# Patient Record
Sex: Female | Born: 1935
Health system: Southern US, Community
[De-identification: ages and names within clinical notes are randomized; demographics above are authoritative.]

## PROBLEM LIST (undated history)

## (undated) DIAGNOSIS — M549 Dorsalgia, unspecified: Secondary | ICD-10-CM

## (undated) DIAGNOSIS — I509 Heart failure, unspecified: Secondary | ICD-10-CM

## (undated) DIAGNOSIS — I340 Nonrheumatic mitral (valve) insufficiency: Secondary | ICD-10-CM

## (undated) DIAGNOSIS — E785 Hyperlipidemia, unspecified: Secondary | ICD-10-CM

## (undated) DIAGNOSIS — R609 Edema, unspecified: Secondary | ICD-10-CM

## (undated) DIAGNOSIS — I454 Nonspecific intraventricular block: Secondary | ICD-10-CM

## (undated) DIAGNOSIS — R600 Localized edema: Secondary | ICD-10-CM

## (undated) DIAGNOSIS — I482 Chronic atrial fibrillation, unspecified: Secondary | ICD-10-CM

## (undated) DIAGNOSIS — G8929 Other chronic pain: Secondary | ICD-10-CM

## (undated) DIAGNOSIS — I4819 Other persistent atrial fibrillation: Secondary | ICD-10-CM

## (undated) HISTORY — DX: Hyperlipidemia, unspecified: E78.5

## (undated) HISTORY — PX: MULTIPLE TOOTH EXTRACTIONS: SHX2053

## (undated) HISTORY — DX: Dorsalgia, unspecified: M54.9

## (undated) HISTORY — DX: Localized edema: R60.0

## (undated) HISTORY — DX: Nonspecific intraventricular block: I45.4

## (undated) HISTORY — DX: Chronic atrial fibrillation, unspecified: I48.20

## (undated) HISTORY — DX: Other chronic pain: G89.29

## (undated) HISTORY — DX: Nonrheumatic mitral (valve) insufficiency: I34.0

## (undated) HISTORY — PX: ABDOMINAL HYSTERECTOMY: SHX81

## (undated) HISTORY — DX: Edema, unspecified: R60.9

---

## 2004-05-15 ENCOUNTER — Ambulatory Visit: Payer: Self-pay | Admitting: Cardiology

## 2004-06-16 ENCOUNTER — Ambulatory Visit: Payer: Self-pay | Admitting: Cardiology

## 2004-07-14 ENCOUNTER — Ambulatory Visit: Payer: Self-pay | Admitting: Cardiology

## 2004-08-11 ENCOUNTER — Ambulatory Visit: Payer: Self-pay | Admitting: Cardiology

## 2004-09-09 ENCOUNTER — Ambulatory Visit: Payer: Self-pay | Admitting: Cardiology

## 2004-10-07 ENCOUNTER — Ambulatory Visit: Payer: Self-pay | Admitting: Cardiology

## 2004-10-16 ENCOUNTER — Ambulatory Visit: Payer: Self-pay | Admitting: Cardiology

## 2004-11-06 ENCOUNTER — Ambulatory Visit: Payer: Self-pay | Admitting: Cardiology

## 2004-11-13 ENCOUNTER — Ambulatory Visit: Payer: Self-pay | Admitting: Cardiology

## 2004-11-20 ENCOUNTER — Ambulatory Visit: Payer: Self-pay | Admitting: Cardiology

## 2004-11-28 ENCOUNTER — Ambulatory Visit: Payer: Self-pay | Admitting: Cardiology

## 2004-12-12 ENCOUNTER — Ambulatory Visit: Payer: Self-pay | Admitting: Cardiology

## 2005-01-12 ENCOUNTER — Ambulatory Visit: Payer: Self-pay | Admitting: Cardiology

## 2005-02-16 ENCOUNTER — Ambulatory Visit: Payer: Self-pay | Admitting: Cardiology

## 2005-02-23 ENCOUNTER — Ambulatory Visit: Payer: Self-pay | Admitting: Cardiology

## 2005-03-03 ENCOUNTER — Ambulatory Visit: Payer: Self-pay | Admitting: Cardiology

## 2005-04-01 ENCOUNTER — Ambulatory Visit: Payer: Self-pay | Admitting: Cardiology

## 2005-04-08 ENCOUNTER — Ambulatory Visit: Payer: Self-pay | Admitting: Cardiology

## 2005-05-06 ENCOUNTER — Ambulatory Visit: Payer: Self-pay | Admitting: Cardiology

## 2005-05-13 ENCOUNTER — Ambulatory Visit: Payer: Self-pay | Admitting: Cardiology

## 2005-06-10 ENCOUNTER — Ambulatory Visit: Payer: Self-pay | Admitting: Cardiology

## 2005-06-17 ENCOUNTER — Ambulatory Visit: Payer: Self-pay | Admitting: Cardiology

## 2005-07-15 ENCOUNTER — Ambulatory Visit: Payer: Self-pay | Admitting: Cardiology

## 2005-07-22 ENCOUNTER — Ambulatory Visit: Payer: Self-pay | Admitting: Cardiology

## 2005-08-19 ENCOUNTER — Ambulatory Visit: Payer: Self-pay | Admitting: Cardiology

## 2005-09-16 ENCOUNTER — Ambulatory Visit: Payer: Self-pay | Admitting: Cardiology

## 2005-10-14 ENCOUNTER — Ambulatory Visit: Payer: Self-pay | Admitting: Cardiology

## 2005-11-10 ENCOUNTER — Ambulatory Visit: Payer: Self-pay | Admitting: Cardiology

## 2005-12-01 ENCOUNTER — Ambulatory Visit: Payer: Self-pay | Admitting: Cardiology

## 2005-12-02 ENCOUNTER — Ambulatory Visit: Payer: Self-pay | Admitting: Cardiology

## 2005-12-03 ENCOUNTER — Ambulatory Visit: Payer: Self-pay | Admitting: Cardiology

## 2005-12-08 ENCOUNTER — Ambulatory Visit: Payer: Self-pay | Admitting: Cardiology

## 2005-12-16 ENCOUNTER — Ambulatory Visit: Payer: Self-pay | Admitting: Cardiology

## 2006-01-04 ENCOUNTER — Ambulatory Visit: Payer: Self-pay | Admitting: Cardiology

## 2006-01-11 ENCOUNTER — Ambulatory Visit: Payer: Self-pay | Admitting: Cardiology

## 2006-01-25 ENCOUNTER — Ambulatory Visit: Payer: Self-pay | Admitting: Cardiology

## 2006-02-23 ENCOUNTER — Ambulatory Visit: Payer: Self-pay | Admitting: Cardiology

## 2006-03-26 ENCOUNTER — Ambulatory Visit: Payer: Self-pay | Admitting: Cardiology

## 2006-04-09 ENCOUNTER — Ambulatory Visit: Payer: Self-pay | Admitting: Cardiology

## 2006-04-16 ENCOUNTER — Ambulatory Visit: Payer: Self-pay | Admitting: Cardiology

## 2006-05-14 ENCOUNTER — Ambulatory Visit: Payer: Self-pay | Admitting: Cardiology

## 2006-06-11 ENCOUNTER — Ambulatory Visit: Payer: Self-pay | Admitting: Cardiology

## 2006-07-09 ENCOUNTER — Ambulatory Visit: Payer: Self-pay | Admitting: Cardiology

## 2006-08-05 ENCOUNTER — Ambulatory Visit: Payer: Self-pay | Admitting: Cardiology

## 2006-09-02 ENCOUNTER — Ambulatory Visit: Payer: Self-pay | Admitting: Cardiology

## 2006-09-30 ENCOUNTER — Ambulatory Visit: Payer: Self-pay | Admitting: Cardiology

## 2006-10-28 ENCOUNTER — Ambulatory Visit: Payer: Self-pay | Admitting: Cardiology

## 2006-11-15 ENCOUNTER — Ambulatory Visit: Payer: Self-pay | Admitting: Cardiology

## 2006-12-10 ENCOUNTER — Ambulatory Visit: Payer: Self-pay | Admitting: Physician Assistant

## 2006-12-23 ENCOUNTER — Ambulatory Visit: Payer: Self-pay | Admitting: Physician Assistant

## 2007-01-04 ENCOUNTER — Ambulatory Visit: Payer: Self-pay | Admitting: Cardiology

## 2007-02-08 ENCOUNTER — Ambulatory Visit: Payer: Self-pay | Admitting: Cardiology

## 2007-03-08 ENCOUNTER — Ambulatory Visit: Payer: Self-pay | Admitting: Cardiology

## 2007-04-05 ENCOUNTER — Ambulatory Visit: Payer: Self-pay | Admitting: Cardiology

## 2007-05-03 ENCOUNTER — Ambulatory Visit: Payer: Self-pay | Admitting: Cardiology

## 2007-05-31 ENCOUNTER — Ambulatory Visit: Payer: Self-pay | Admitting: Cardiology

## 2007-06-29 ENCOUNTER — Ambulatory Visit: Payer: Self-pay | Admitting: Cardiology

## 2007-07-07 ENCOUNTER — Ambulatory Visit: Payer: Self-pay | Admitting: Cardiology

## 2007-07-27 ENCOUNTER — Ambulatory Visit: Payer: Self-pay | Admitting: Cardiology

## 2007-08-24 ENCOUNTER — Ambulatory Visit: Payer: Self-pay | Admitting: Cardiology

## 2007-09-21 ENCOUNTER — Ambulatory Visit: Payer: Self-pay | Admitting: Cardiology

## 2007-10-19 ENCOUNTER — Ambulatory Visit: Payer: Self-pay | Admitting: Cardiology

## 2007-11-15 ENCOUNTER — Ambulatory Visit: Payer: Self-pay | Admitting: Cardiology

## 2007-11-17 ENCOUNTER — Encounter: Payer: Self-pay | Admitting: Cardiology

## 2007-12-14 ENCOUNTER — Ambulatory Visit: Payer: Self-pay | Admitting: Cardiology

## 2008-01-03 ENCOUNTER — Ambulatory Visit: Payer: Self-pay | Admitting: Cardiology

## 2008-01-17 ENCOUNTER — Ambulatory Visit: Payer: Self-pay | Admitting: Cardiology

## 2008-02-20 ENCOUNTER — Ambulatory Visit: Payer: Self-pay | Admitting: Cardiology

## 2008-03-20 ENCOUNTER — Ambulatory Visit: Payer: Self-pay | Admitting: Cardiology

## 2008-04-17 ENCOUNTER — Ambulatory Visit: Payer: Self-pay | Admitting: Cardiology

## 2008-05-15 ENCOUNTER — Ambulatory Visit: Payer: Self-pay | Admitting: Cardiology

## 2008-06-12 ENCOUNTER — Ambulatory Visit: Payer: Self-pay | Admitting: Cardiology

## 2008-07-10 ENCOUNTER — Ambulatory Visit: Payer: Self-pay | Admitting: Cardiology

## 2008-08-07 ENCOUNTER — Ambulatory Visit: Payer: Self-pay | Admitting: Cardiology

## 2008-09-04 ENCOUNTER — Ambulatory Visit: Payer: Self-pay | Admitting: Cardiology

## 2008-10-02 ENCOUNTER — Ambulatory Visit: Payer: Self-pay | Admitting: Cardiology

## 2008-10-30 ENCOUNTER — Ambulatory Visit: Payer: Self-pay

## 2008-11-20 ENCOUNTER — Ambulatory Visit: Payer: Self-pay | Admitting: Cardiology

## 2008-12-04 ENCOUNTER — Ambulatory Visit: Payer: Self-pay | Admitting: Cardiology

## 2008-12-21 ENCOUNTER — Ambulatory Visit: Payer: Self-pay

## 2009-01-15 ENCOUNTER — Ambulatory Visit: Payer: Self-pay | Admitting: Cardiology

## 2009-01-16 ENCOUNTER — Encounter (INDEPENDENT_AMBULATORY_CARE_PROVIDER_SITE_OTHER): Payer: Self-pay | Admitting: *Deleted

## 2009-01-16 ENCOUNTER — Ambulatory Visit: Payer: Self-pay | Admitting: Cardiology

## 2009-02-11 ENCOUNTER — Encounter: Payer: Self-pay | Admitting: *Deleted

## 2009-02-12 ENCOUNTER — Ambulatory Visit: Payer: Self-pay | Admitting: Cardiology

## 2009-02-12 LAB — CONVERTED CEMR LAB: Prothrombin Time: 16.2 s

## 2009-03-05 ENCOUNTER — Ambulatory Visit: Payer: Self-pay | Admitting: Cardiology

## 2009-04-02 ENCOUNTER — Ambulatory Visit: Payer: Self-pay | Admitting: Cardiology

## 2009-04-02 LAB — CONVERTED CEMR LAB: POC INR: 2.1

## 2009-04-30 ENCOUNTER — Ambulatory Visit: Payer: Self-pay | Admitting: Cardiology

## 2009-04-30 LAB — CONVERTED CEMR LAB: POC INR: 2.8

## 2009-05-28 ENCOUNTER — Ambulatory Visit: Payer: Self-pay | Admitting: Cardiology

## 2009-05-28 LAB — CONVERTED CEMR LAB: POC INR: 2.4

## 2009-06-25 ENCOUNTER — Ambulatory Visit: Payer: Self-pay | Admitting: Cardiology

## 2009-06-25 LAB — CONVERTED CEMR LAB: POC INR: 2.4

## 2009-07-23 ENCOUNTER — Ambulatory Visit: Payer: Self-pay | Admitting: Cardiology

## 2009-07-23 LAB — CONVERTED CEMR LAB: POC INR: 2.4

## 2009-08-20 ENCOUNTER — Ambulatory Visit: Payer: Self-pay | Admitting: Cardiology

## 2009-08-20 LAB — CONVERTED CEMR LAB: POC INR: 2.1

## 2009-09-17 ENCOUNTER — Ambulatory Visit: Payer: Self-pay | Admitting: Cardiology

## 2009-10-08 ENCOUNTER — Ambulatory Visit: Payer: Self-pay | Admitting: Cardiology

## 2009-11-05 ENCOUNTER — Ambulatory Visit: Payer: Self-pay | Admitting: Cardiology

## 2009-12-03 ENCOUNTER — Ambulatory Visit: Payer: Self-pay | Admitting: Cardiology

## 2009-12-31 ENCOUNTER — Ambulatory Visit: Payer: Self-pay | Admitting: Cardiology

## 2010-01-28 ENCOUNTER — Ambulatory Visit: Payer: Self-pay | Admitting: Cardiology

## 2010-01-28 LAB — CONVERTED CEMR LAB: POC INR: 2.8

## 2010-02-03 ENCOUNTER — Encounter: Payer: Self-pay | Admitting: Cardiology

## 2010-02-05 ENCOUNTER — Ambulatory Visit: Payer: Self-pay | Admitting: Cardiology

## 2010-02-25 ENCOUNTER — Ambulatory Visit: Payer: Self-pay | Admitting: Cardiology

## 2010-02-25 LAB — CONVERTED CEMR LAB: POC INR: 2.7

## 2010-03-25 ENCOUNTER — Ambulatory Visit: Payer: Self-pay | Admitting: Cardiology

## 2010-03-25 LAB — CONVERTED CEMR LAB: POC INR: 3.1

## 2010-04-22 ENCOUNTER — Ambulatory Visit: Payer: Self-pay | Admitting: Cardiology

## 2010-04-22 LAB — CONVERTED CEMR LAB: POC INR: 2.7

## 2010-05-20 ENCOUNTER — Ambulatory Visit: Payer: Self-pay | Admitting: Cardiology

## 2010-05-20 LAB — CONVERTED CEMR LAB: POC INR: 2.5

## 2010-06-17 ENCOUNTER — Ambulatory Visit: Payer: Self-pay | Admitting: Cardiology

## 2010-07-02 ENCOUNTER — Encounter: Payer: Self-pay | Admitting: Cardiology

## 2010-07-18 ENCOUNTER — Ambulatory Visit: Admission: RE | Admit: 2010-07-18 | Discharge: 2010-07-18 | Payer: Self-pay | Source: Home / Self Care

## 2010-07-29 NOTE — Medication Information (Signed)
Summary: ccr-lr  Anticoagulant Therapy  Managed by: Vashti Hey, RN PCP: Donzetta Sprung, MD Supervising MD: Diona Browner MD, Remi Deter Indication 1: Atrial Fibrillation (ICD-427.31) Lab [facility] [place] Site: [facility] Clinic INR POC 2.7  Dietary changes: no    Health status changes: no    Bleeding/hemorrhagic complications: no    Recent/future hospitalizations: no    Any changes in medication regimen? yes       Details: On tramadol for pain  Recent/future dental: no  Any missed doses?: no       Is patient compliant with meds? yes       Allergies: 1)  ! Pcn  Anticoagulation Management History:      The patient is taking warfarin and comes in today for a routine follow up visit.  Positive risk factors for bleeding include an age of 3 years or older.  The bleeding index is 'intermediate risk'.  Positive CHADS2 values include Age > 44 years old.  The start date was 07/20/2001.  Anticoagulation responsible provider: Diona Browner MD, Remi Deter.  INR POC: 2.7.  Cuvette Lot#: [id number].  Exp: 10/11.    Anticoagulation Management Assessment/Plan:      The patient's current anticoagulation dose is Coumadin 4 mg tabs: TAD.  The target INR is 2 - 3.  The next INR is due 05/20/2010.  Anticoagulation instructions were given to patient.  Results were reviewed/authorized by Vashti Hey, RN.  She was notified by Vashti Hey RN.         Prior Anticoagulation Instructions: INR 3.1 Take coumadin 2mg  tonight then resume 4mg  once daily   Current Anticoagulation Instructions: INR 2.7 Continue coumadin 4mg  once daily

## 2010-07-29 NOTE — Medication Information (Signed)
Summary: ccr-lr  Anticoagulant Therapy  Managed by: Vashti Hey, RN PCP: Billie Lade MD: Andee Lineman MD, Michelle Piper Indication 1: Atrial Fibrillation (ICD-427.31) Lab Used: [facility] [place] Site: [facility] Clinic INR POC 1.8  Dietary changes: no    Health status changes: yes       Details: shingles on back  Bleeding/hemorrhagic complications: no    Recent/future hospitalizations: no    Any changes in medication regimen? yes       Details: on valtrex 1gm 1 tab tid  started on 3/18 - 3/25  Recent/future dental: no  Any missed doses?: no       Is patient compliant with meds? yes       Anticoagulation Management History:      The patient is taking warfarin and comes in today for a routine follow up visit.  Positive risk factors for bleeding include an age of 16 years or older.  The bleeding index is 'intermediate risk'.  Positive CHADS2 values include Age > 34 years old.  The start date was 07/20/2001.  Anticoagulation responsible provider: Andee Lineman MD, Michelle Piper.  INR POC: 1.8.  Cuvette Lot#: [id number].  Exp: 10/11.    Anticoagulation Management Assessment/Plan:      The patient's current anticoagulation dose is Coumadin 4 mg tabs: TAD.  The target INR is 2 - 3.  The next INR is due 10/08/2009.  Anticoagulation instructions were given to patient.  Results were reviewed/authorized by Vashti Hey, RN.  She was notified by Vashti Hey RN.         Prior Anticoagulation Instructions: INR  2.1 Continue coumadin 4mg  once daily except 2mg  on fridays  Current Anticoagulation Instructions: INR 1.8 Take coumadin 6mg  tonight then increase dose to 4mg  once daily

## 2010-07-29 NOTE — Medication Information (Signed)
Summary: CCR  Anticoagulant Therapy  Managed by: Vashti Hey, RN PCP: Billie Lade MD: Diona Browner MD, Remi Deter Indication 1: Atrial Fibrillation (ICD-427.31) Lab Used: [facility] [place] Site: [facility] Care Clinic INR POC 2.5  Dietary changes: no    Health status changes: no    Bleeding/hemorrhagic complications: no    Recent/future hospitalizations: no    Any changes in medication regimen? no    Recent/future dental: no  Any missed doses?: no       Is patient compliant with meds? yes       Anticoagulation Management History:      The patient is taking warfarin and comes in today for a routine follow up visit.  Positive risk factors for bleeding include an age of 80 years or older.  The bleeding index is 'intermediate risk'.  Positive CHADS2 values include Age > 66 years old.  The start date was 07/20/2001.  Anticoagulation responsible provider: Diona Browner MD, Remi Deter.  INR POC: 2.5.  Cuvette Lot#: [id number].  Exp: 10/11.    Anticoagulation Management Assessment/Plan:      The patient's current anticoagulation dose is Coumadin 4 mg tabs: TAD.  The target INR is 2 - 3.  The next INR is due 12/03/2009.  Anticoagulation instructions were given to patient.  Results were reviewed/authorized by Vashti Hey, RN.  She was notified by Vashti Hey RN.         Prior Anticoagulation Instructions: INR 2.2 Continue coumadin 4mg  once daily   Current Anticoagulation Instructions: INR 2.5 Continue coumadin 4mg  once daily

## 2010-07-29 NOTE — Assessment & Plan Note (Signed)
Summary: F1Y   Visit Type:  Follow-up Primary Provider:  Donzetta Sprung, MD  CC:  atrial fibrillation.  History of Present Illness: The patient is seen for followup of atrial fibrillation.  She is stable.  She does not have any chest pain or shortness of breath.  She does not have significant palpitations very I saw her last in July of 2010. she is on Coumadin.  She says that at times she may have some mild "staggering gait."  She has not had syncope or presyncope.  She knows to be careful if she feels this way.  Preventive Screening-Counseling & Management  Alcohol-Tobacco     Smoking Status: quit     Year Quit: 1970's  Current Medications (verified): 1)  Coumadin 4 Mg Tabs (Warfarin Sodium) .... Tad 2)  Multiple Vitamin  Tabs (Multiple Vitamin) .... Once Daily 3)  Fish Oil 1200 Mg Caps (Omega-3 Fatty Acids) .... Take 1 Tablet By Mouth Two Times A Day 4)  Furosemide 40 Mg Tabs (Furosemide) .... Take 1 Tablet By Mouth Two Times A Day 5)  Hydrochlorothiazide 25 Mg Tabs (Hydrochlorothiazide) .... Take 1 Tablet By Mouth Once A Day 6)  Lovastatin 20 Mg Tabs (Lovastatin) .... Take 1 Tablet By Mouth Once A Day 7)  Vitamin D3 1000 Unit Tabs (Cholecalciferol) .... Take 1 Tablet By Mouth Once A Day 8)  Diltiazem Hcl Er Beads 240 Mg Xr24h-Cap (Diltiazem Hcl Er Beads) .... Take 1 Tablet By Mouth Once A Day 9)  Lyrica 50 Mg Caps (Pregabalin) .... Take 1 Tablet By Mouth Once A Day  Allergies (verified): 1)  ! Pcn  Comments:  Nurse/Medical Assistant: The patient's medication bottles and allergies were reviewed with the patient and were updated in the Medication and Allergy Lists.  Past History:  Past Medical History: HYPERLIPIDEMIA-MIXED (ICD-272.4) Chronic lower extremity edema MITRAL REGURGITATION, MILD (ICD-396.3) ATRIAL FIBRILLATION (ICD-427.31) LV  normal function...echo  June, 2007 MR   mild ...echo.Marland KitchenMarland Kitchen6/2007 IVCD Coumadin RX Nuclear 11/2005...no ischemia...possible attenuation  artifact Chest CT scans abnormal...2005/2006/2007...stable liver mass...also hypodense liver lesions.  Review of Systems       Patient denies fever, chills, headache, sweats, rash, change in vision, change in hearing, chest pain, cough, nausea vomiting, urinary symptoms.  All other systems are reviewed and are negative.  Vital Signs:  Patient profile:   77 year old female Height:      66 inches Weight:      202 pounds BMI:     32.72 Pulse rate:   57 / minute BP sitting:   110 / 64  (left arm) Cuff size:   regular  Vitals Entered By: Carlye Grippe (February 05, 2010 1:32 PM)  Nutrition Counseling: Patient's BMI is greater than 25 and therefore counseled on weight management options.  Physical Exam  General:  patient is quite stable. Head:  head is atraumatic. Neck:  no jugular venous distention. Lungs:  lungs are clear.  Respiratory effort is unlabored. Heart:  cardiac exam reveals S1-S2.  No clicks or significant murmurs. Abdomen:  abdomen is soft. Extremities:  no peripheral edema. Psych:  patient is oriented to person time and place.  Affect is normal.   Impression & Recommendations:  Problem # 1:  COUMADIN THERAPY (ICD-V58.61) Coumadin is to be continued.  Problem # 2:  EDEMA (ICD-782.3) The patient has mild chronic edema.  Problem # 3:  ATRIAL FIBRILLATION (ICD-427.31)  Her updated medication list for this problem includes:    Coumadin 4 Mg Tabs (Warfarin sodium) .Marland KitchenMarland KitchenMarland KitchenMarland Kitchen  Tad  Orders: EKG w/ Interpretation (93000) Atrial fib is stable.  EKG done today reviewed by me.  She has atrial fib controlled rate.  There is interventricular conduction delay.  No change in therapy.  Appended Document: [facility]     Allergies: 1)  ! Pcn   Patient Instructions: 1)  Your physician wants you to follow-up in: 1 year. You will receive a reminder letter in the mail one-two months in advance. If you don't receive a letter, please call our office to schedule the  follow-up appointment.  2)  Your physician recommends that you continue on your current medications as directed. Please refer to the Current Medication list given to you today.

## 2010-07-29 NOTE — Miscellaneous (Signed)
  Clinical Lists Changes  Problems: Added new problem of COUMADIN THERAPY (ICD-V58.61) Added new problem of * LIVER MASS AND HYPODENSE LESIONS Observations: Added new observation of PAST MED HX: HYPERLIPIDEMIA-MIXED (ICD-272.4) Chronic lower extremity edema MITRAL REGURGITATION, MILD (ICD-396.3) ATRIAL FIBRILLATION (ICD-427.31) LV  normal function...echo  June, 2007 MR   mild ...echo.Marland KitchenMarland Kitchen6/2007 IVCD Coumadin RX Nuclear 11/2005...no ischemia...possible attenuation artifact Chest CT scans abnormal...2005/2006/2007...stable liver mass...also hypodense liver lesions.  (02/03/2010 19:15) Added new observation of PRIMARY MD: Donzetta Sprung, MD (02/03/2010 19:15)       Past History:  Past Medical History: HYPERLIPIDEMIA-MIXED (ICD-272.4) Chronic lower extremity edema MITRAL REGURGITATION, MILD (ICD-396.3) ATRIAL FIBRILLATION (ICD-427.31) LV  normal function...echo  June, 2007 MR   mild ...echo.Marland KitchenMarland Kitchen6/2007 IVCD Coumadin RX Nuclear 11/2005...no ischemia...possible attenuation artifact Chest CT scans abnormal...2005/2006/2007...stable liver mass...also hypodense liver lesions.

## 2010-07-29 NOTE — Medication Information (Signed)
Summary: ccr  Anticoagulant Therapy  Managed by: Vashti Hey, RN PCP: Billie Lade MD: Andee Lineman MD, Michelle Piper Indication 1: Atrial Fibrillation (ICD-427.31) Lab Used: [facility] [place] Site: [facility] Care Clinic INR POC 2.1  Dietary changes: no    Health status changes: yes       Details: started drinking tart cherry juice for arthritis  Bleeding/hemorrhagic complications: no    Recent/future hospitalizations: no    Any changes in medication regimen? no    Recent/future dental: no  Any missed doses?: no       Is patient compliant with meds? yes       Anticoagulation Management History:      The patient is taking warfarin and comes in today for a routine follow up visit.  Positive risk factors for bleeding include an age of 75 years or older.  The bleeding index is 'intermediate risk'.  Positive CHADS2 values include Age > 43 years old.  The start date was 07/20/2001.  Anticoagulation responsible provider: Andee Lineman MD, Michelle Piper.  INR POC: 2.1.  Cuvette Lot#: [id number].  Exp: 10/11.    Anticoagulation Management Assessment/Plan:      The patient's current anticoagulation dose is Coumadin 4 mg tabs: TAD.  The target INR is 2 - 3.  The next INR is due 09/17/2009.  Anticoagulation instructions were given to patient.  Results were reviewed/authorized by Vashti Hey, RN.  She was notified by Vashti Hey RN.         Prior Anticoagulation Instructions: INR 2.4 Continue coumadin 4mg  once daily except 2mg  on Fridays  Current Anticoagulation Instructions: INR  2.1 Continue coumadin 4mg  once daily except 2mg  on fridays

## 2010-07-29 NOTE — Medication Information (Signed)
Summary: ccr-lr  Anticoagulant Therapy  Managed by: Vashti Hey, RN PCP: Billie Lade MD: Antoine Poche MD, Fayrene Fearing Indication 1: Atrial Fibrillation (ICD-427.31) Lab Used: [facility] [place] Site: [facility] of Care Clinic INR POC 2.5  Dietary changes: no    Health status changes: no    Bleeding/hemorrhagic complications: no    Recent/future hospitalizations: no    Any changes in medication regimen? yes       Details: Lyrica 50mg  1 qd x 3 weeks for shingle pain  Recent/future dental: no  Any missed doses?: no       Is patient compliant with meds? yes       Anticoagulation Management History:      The patient is taking warfarin and comes in today for a routine follow up visit.  Positive risk factors for bleeding include an age of 75 years or older.  The bleeding index is 'intermediate risk'.  Positive CHADS2 values include Age > 84 years old.  The start date was 07/20/2001.  Anticoagulation responsible provider: Antoine Poche MD, Fayrene Fearing.  INR POC: 2.5.  Cuvette Lot#: [id number].  Exp: 10/11.    Anticoagulation Management Assessment/Plan:      The patient's current anticoagulation dose is Coumadin 4 mg tabs: TAD.  The target INR is 2 - 3.  The next INR is due 01/28/2010.  Anticoagulation instructions were given to patient.  Results were reviewed/authorized by Vashti Hey, RN.  She was notified by Vashti Hey RN.         Prior Anticoagulation Instructions: INR 2.8 Continue coumadin 4mg  once daily   Current Anticoagulation Instructions: INR 2.5 Continue coumadin 4mg  once daily

## 2010-07-29 NOTE — Medication Information (Signed)
Summary: ccr-lr  Anticoagulant Therapy  Managed by: Vashti Hey, RN PCP: Donzetta Sprung, MD Supervising MD: Diona Browner MD, Remi Deter Indication 1: Atrial Fibrillation (ICD-427.31) Lab Used: [facility] [place] Site: [facility] Care Clinic INR POC 2.5  Dietary changes: no    Health status changes: no    Bleeding/hemorrhagic complications: no    Recent/future hospitalizations: no    Any changes in medication regimen? no    Recent/future dental: no  Any missed doses?: no       Is patient compliant with meds? yes       Allergies: 1)  ! Pcn  Anticoagulation Management History:      The patient is taking warfarin and comes in today for a routine follow up visit.  Positive risk factors for bleeding include an age of 75 years or older.  The bleeding index is 'intermediate risk'.  Positive CHADS2 values include Age > 75 years old.  The start date was 07/20/2001.  Anticoagulation responsible provider: Diona Browner MD, Remi Deter.  INR POC: 2.5.  Cuvette Lot#: [id number].  Exp: 10/11.    Anticoagulation Management Assessment/Plan:      The patient's current anticoagulation dose is Coumadin 4 mg tabs: TAD.  The target INR is 2 - 3.  The next INR is due 06/17/2010.  Anticoagulation instructions were given to patient.  Results were reviewed/authorized by Vashti Hey, RN.  She was notified by Vashti Hey RN.         Prior Anticoagulation Instructions: INR 2.7 Continue coumadin 4mg  once daily   Current Anticoagulation Instructions: INR 2.5 Continue coumadin 4mg  once daily

## 2010-07-29 NOTE — Medication Information (Signed)
Summary: ccr-lr  Anticoagulant Therapy  Managed by: Vashti Hey, RN PCP: Billie Lade MD: Andee Lineman MD, Michelle Piper Indication 1: Atrial Fibrillation (ICD-427.31) Lab Used: [facility] [place] Site: [facility] Care Clinic INR POC 2.4  Dietary changes: no    Health status changes: no    Bleeding/hemorrhagic complications: no    Recent/future hospitalizations: no    Any changes in medication regimen? no    Recent/future dental: no  Any missed doses?: no       Is patient compliant with meds? yes       Anticoagulation Management History:      The patient is taking warfarin and comes in today for a routine follow up visit.  Positive risk factors for bleeding include an age of 10 years or older.  The bleeding index is 'intermediate risk'.  Positive CHADS2 values include Age > 29 years old.  The start date was 07/20/2001.  Anticoagulation responsible provider: Andee Lineman MD, Michelle Piper.  INR POC: 2.4.  Cuvette Lot#: [id number].  Exp: 10/11.    Anticoagulation Management Assessment/Plan:      The patient's current anticoagulation dose is Coumadin 4 mg tabs: TAD.  The target INR is 2 - 3.  The next INR is due 08/20/2009.  Anticoagulation instructions were given to patient.  Results were reviewed/authorized by Vashti Hey, RN.  She was notified by Vashti Hey RN.         Prior Anticoagulation Instructions: INR 2.4 Continue coumadin 4mg  once daily except 2mg  on Fridays  Current Anticoagulation Instructions: Same as Prior Instructions.

## 2010-07-29 NOTE — Medication Information (Signed)
Summary: ccr-lr  Anticoagulant Therapy  Managed by: Vashti Hey, RN PCP: Donzetta Sprung, MD Supervising MD: Andee Lineman MD, Michelle Piper Indication 1: Atrial Fibrillation (ICD-427.31) Lab Used: [facility] [place] Site: [facility] Clinic INR POC 3.1  Dietary changes: no    Health status changes: no    Bleeding/hemorrhagic complications: no    Recent/future hospitalizations: no    Any changes in medication regimen? no    Recent/future dental: no  Any missed doses?: no       Is patient compliant with meds? yes       Allergies: 1)  ! Pcn  Anticoagulation Management History:      The patient is taking warfarin and comes in today for a routine follow up visit.  Positive risk factors for bleeding include an age of 75 years or older.  The bleeding index is 'intermediate risk'.  Positive CHADS2 values include Age > 75 years old.  The start date was 07/20/2001.  Anticoagulation responsible provider: Andee Lineman MD, Michelle Piper.  INR POC: 3.1.  Cuvette Lot#: [id number].  Exp: 10/11.    Anticoagulation Management Assessment/Plan:      The patient's current anticoagulation dose is Coumadin 4 mg tabs: TAD.  The target INR is 2 - 3.  The next INR is due 04/22/2010.  Anticoagulation instructions were given to patient.  Results were reviewed/authorized by Vashti Hey, RN.  She was notified by Vashti Hey RN.         Prior Anticoagulation Instructions: INR 2.7 Continue coumadin 4mg  once daily   Current Anticoagulation Instructions: INR 3.1 Take coumadin 2mg  tonight then resume 4mg  once daily

## 2010-07-29 NOTE — Medication Information (Signed)
Summary: ccr-lr  Anticoagulant Therapy  Managed by: Vashti Hey, RN PCP: Billie Lade MD: Diona Browner MD, Remi Deter Indication 1: Atrial Fibrillation (ICD-427.31) Lab Used: [facility] [place] Site: [facility] of Care Clinic INR POC 2.2  Dietary changes: no    Health status changes: no    Bleeding/hemorrhagic complications: no    Recent/future hospitalizations: no    Any changes in medication regimen? no    Recent/future dental: no  Any missed doses?: no       Is patient compliant with meds? yes       Anticoagulation Management History:      The patient is taking warfarin and comes in today for a routine follow up visit.  Positive risk factors for bleeding include an age of 75 years or older.  The bleeding index is 'intermediate risk'.  Positive CHADS2 values include Age > 2 years old.  The start date was 07/20/2001.  Anticoagulation responsible provider: Diona Browner MD, Remi Deter.  INR POC: 2.2.  Cuvette Lot#: [id number].  Exp: 10/11.    Anticoagulation Management Assessment/Plan:      The patient's current anticoagulation dose is Coumadin 4 mg tabs: TAD.  The target INR is 2 - 3.  The next INR is due 11/05/2009.  Anticoagulation instructions were given to patient.  Results were reviewed/authorized by Vashti Hey, RN.  She was notified by Vashti Hey RN.         Prior Anticoagulation Instructions: INR 1.8 Take coumadin 6mg  tonight then increase dose to 4mg  once daily   Current Anticoagulation Instructions: INR 2.2 Continue coumadin 4mg  once daily

## 2010-07-29 NOTE — Medication Information (Signed)
Summary: ccr-lr  Anticoagulant Therapy  Managed by: Vashti Hey, RN PCP: Billie Lade MD: Diona Browner MD, Remi Deter Indication 1: Atrial Fibrillation (ICD-427.31) Lab [facility]: [facility] [place] Site: [facility] Care Clinic INR POC 2.8  Dietary changes: no    Health status changes: no    Bleeding/hemorrhagic complications: no    Recent/future hospitalizations: no    Any changes in medication regimen? no    Recent/future dental: no  Any missed doses?: yes     Details: missed 1 dose last week  Is patient compliant with meds? yes       Anticoagulation Management History:      The patient is taking warfarin and comes in today for a routine follow up visit.  Positive risk factors for bleeding include an age of 75 years or older.  The bleeding index is 'intermediate risk'.  Positive CHADS2 values include Age > 66 years old.  The start date was 07/20/2001.  Anticoagulation responsible provider: Diona Browner MD, Remi Deter.  INR POC: 2.8.  Cuvette Lot#: [id number].  Exp: 10/11.    Anticoagulation Management Assessment/Plan:      The patient's current anticoagulation dose is Coumadin 4 mg tabs: TAD.  The target INR is 2 - 3.  The next INR is due 12/31/2009.  Anticoagulation instructions were given to patient.  Results were reviewed/authorized by Vashti Hey, RN.  She was notified by Vashti Hey RN.         Prior Anticoagulation Instructions: INR 2.5 Continue coumadin 4mg  once daily   Current Anticoagulation Instructions: INR 2.8 Continue coumadin 4mg  once daily

## 2010-07-29 NOTE — Medication Information (Signed)
Summary: ccr-lr  Anticoagulant Therapy  Managed by: Vashti Hey, RN PCP: Billie Lade MD: Diona Browner MD, Remi Deter Indication 1: Atrial Fibrillation (ICD-427.31) Lab Used: [facility] [place] Site: [facility] of Care Clinic INR POC 2.8  Dietary changes: no    Health status changes: no    Bleeding/hemorrhagic complications: no    Recent/future hospitalizations: no    Any changes in medication regimen? no    Recent/future dental: no  Any missed doses?: no       Is patient compliant with meds? yes       Anticoagulation Management History:      The patient is taking warfarin and comes in today for a routine follow up visit.  Positive risk factors for bleeding include an age of 75 years or older.  The bleeding index is 'intermediate risk'.  Positive CHADS2 values include Age > 34 years old.  The start date was 07/20/2001.  Anticoagulation responsible provider: Diona Browner MD, Remi Deter.  INR POC: 2.8.  Cuvette Lot#: [id number].  Exp: 10/11.    Anticoagulation Management Assessment/Plan:      The patient's current anticoagulation dose is Coumadin 4 mg tabs: TAD.  The target INR is 2 - 3.  The next INR is due 02/25/2010.  Anticoagulation instructions were given to patient.  Results were reviewed/authorized by Vashti Hey, RN.  She was notified by Vashti Hey RN.         Prior Anticoagulation Instructions: INR 2.5 Continue coumadin 4mg  once daily   Current Anticoagulation Instructions: INR 2.8 Continue coumadin 4mg  once daily

## 2010-07-29 NOTE — Medication Information (Signed)
Summary: ccr-lr  Anticoagulant Therapy  Managed by: Vashti Hey, RN PCP: Donzetta Sprung, MD Supervising MD: Andee Lineman MD, Michelle Piper Indication 1: Atrial Fibrillation (ICD-427.31) Lab [facility] [place] Site: [facility] Clinic INR POC 2.7  Dietary changes: no    Health status changes: no    Bleeding/hemorrhagic complications: no    Recent/future hospitalizations: no    Any changes in medication regimen? no    Recent/future dental: no  Any missed doses?: no       Is patient compliant with meds? yes       Allergies: 1)  ! Pcn  Anticoagulation Management History:      The patient is taking warfarin and comes in today for a routine follow up visit.  Positive risk factors for bleeding include an age of 17 years or older.  The bleeding index is 'intermediate risk'.  Positive CHADS2 values include Age > 26 years old.  The start date was 07/20/2001.  Anticoagulation responsible provider: Andee Lineman MD, Michelle Piper.  INR POC: 2.7.  Cuvette Lot#: [id number].  Exp: 10/11.    Anticoagulation Management Assessment/Plan:      The patient's current anticoagulation dose is Coumadin 4 mg tabs: TAD.  The target INR is 2 - 3.  The next INR is due 03/25/2010.  Anticoagulation instructions were given to patient.  Results were reviewed/authorized by Vashti Hey, RN.  She was notified by Vashti Hey RN.         Prior Anticoagulation Instructions: INR 2.8 Continue coumadin 4mg  once daily   Current Anticoagulation Instructions: INR 2.7 Continue coumadin 4mg  once daily

## 2010-07-31 NOTE — Medication Information (Signed)
Summary: ccr-lr  Anticoagulant Therapy  Managed by: Vashti Hey, RN PCP: Donzetta Sprung, MD Supervising MD: Diona Browner MD, Remi Deter Indication 1: Atrial Fibrillation (ICD-427.31) Lab Used: [facility] [place] Site: [facility] INR POC 3.2  Dietary changes: no    Health status changes: no    Bleeding/hemorrhagic complications: no    Recent/future hospitalizations: no    Any changes in medication regimen? no    Recent/future dental: no  Any missed doses?: yes     Details: missed 1 dose  Is patient compliant with meds? yes       Allergies: 1)  ! Pcn  Anticoagulation Management History:      The patient is taking warfarin and comes in today for a routine follow up visit.  Positive risk factors for bleeding include an age of 75 years or older.  The bleeding index is 'intermediate risk'.  Positive CHADS2 values include Age > 34 years old.  The start date was 07/20/2001.  Anticoagulation responsible Aidynn Polendo: Diona Browner MD, Remi Deter.  INR POC: 3.2.  Cuvette Lot#: [id number].  Exp: 10/11.    Anticoagulation Management Assessment/Plan:      The patient's current anticoagulation dose is Coumadin 4 mg tabs: TAD.  The target INR is 2 - 3.  The next INR is due 07/15/2010.  Anticoagulation instructions were given to patient.  Results were reviewed/authorized by Vashti Hey, RN.  She was notified by Vashti Hey RN.         Prior Anticoagulation Instructions: INR 2.5 Continue coumadin 4mg  once daily   Current Anticoagulation Instructions: INR 3.2 Take coumadin 1/2 tablet tonight then resume 1 tablet once daily

## 2010-07-31 NOTE — Letter (Signed)
Summary: External Correspondence/ [facility] OFFICE VISIT  External Correspondence/ [facility] OFFICE VISIT   Imported By: Dorise Hiss 07/09/2010 14:02:20  _____________________________________________________________________  External Attachment:    Type:   Image     Comment:   External Document

## 2010-07-31 NOTE — Medication Information (Signed)
Summary: ccr-lr  Anticoagulant Therapy  Managed by: Vashti Hey, RN PCP: Donzetta Sprung, MD Supervising MD: Andee Lineman MD, Michelle Piper Indication 1: Atrial Fibrillation (ICD-427.31) Lab Used: [facility] [place] Site: [facility] Care Clinic INR POC 3.7  Dietary changes: no    Health status changes: no    Bleeding/hemorrhagic complications: no    Recent/future hospitalizations: no    Any changes in medication regimen? no    Recent/future dental: no  Any missed doses?: no       Is patient compliant with meds? yes       Allergies: 1)  ! Pcn  Anticoagulation Management History:      The patient is taking warfarin and comes in today for a routine follow up visit.  Positive risk factors for bleeding include an age of 75 years or older.  The bleeding index is 'intermediate risk'.  Positive CHADS2 values include Age > 75 years old.  The start date was 07/20/2001.  Anticoagulation responsible provider: Andee Lineman MD, Michelle Piper.  INR POC: 3.7.  Cuvette Lot#: [id number].  Exp: 10/11.    Anticoagulation Management Assessment/Plan:      The patient's current anticoagulation dose is Coumadin 4 mg tabs: TAD.  The target INR is 2 - 3.  The next INR is due 08/08/2010.  Anticoagulation instructions were given to patient.  Results were reviewed/authorized by Vashti Hey, RN.  She was notified by Vashti Hey RN.         Prior Anticoagulation Instructions: INR 3.2 Take coumadin 1/2 tablet tonight then resume 1 tablet once daily   Current Anticoagulation Instructions: INR 3.7 Hold coumadin tonight then dcrease dose to 4mg  once daily except 2mg  on Mondays

## 2010-08-08 ENCOUNTER — Encounter: Payer: Self-pay | Admitting: Cardiology

## 2010-08-08 ENCOUNTER — Encounter (INDEPENDENT_AMBULATORY_CARE_PROVIDER_SITE_OTHER): Payer: Medicare Other

## 2010-08-08 DIAGNOSIS — I4891 Unspecified atrial fibrillation: Secondary | ICD-10-CM

## 2010-08-08 DIAGNOSIS — Z7901 Long term (current) use of anticoagulants: Secondary | ICD-10-CM

## 2010-08-08 LAB — CONVERTED CEMR LAB: POC INR: 2.1

## 2010-08-14 NOTE — Medication Information (Signed)
Summary: Coumadin Lab Visit  Lab Visit  Orders Today:  Anticoagulant Therapy  Managed by: Vashti Hey, RN PCP: Donzetta Sprung, MD Supervising MD: Diona Browner MD, Remi Deter Indication 1: Atrial Fibrillation (ICD-427.31) Lab Used: [facility] [place] Site: [facility] Care Clinic INR POC 2.1  Dietary changes: no    Health status changes: no    Bleeding/hemorrhagic complications: no    Recent/future hospitalizations: no    Any changes in medication regimen? no    Recent/future dental: no  Any missed doses?: no       Is patient compliant with meds? yes         Anticoagulation Management History:      The patient is taking warfarin and comes in today for a routine follow up visit.  Positive risk factors for bleeding include an age of 75 years or older.  The bleeding index is 'intermediate risk'.  Positive CHADS2 values include Age > 53 years old.  The start date was 07/20/2001.  Anticoagulation responsible provider: Diona Browner MD, Remi Deter.  INR POC: 2.1.  Cuvette Lot#: [id number].  Exp: 10/11.    Anticoagulation Management Assessment/Plan:      The patient's current anticoagulation dose is Coumadin 4 mg tabs: TAD.  The target INR is 2 - 3.  The next INR is due 09/05/2010.  Anticoagulation instructions were given to patient.  Results were reviewed/authorized by Vashti Hey, RN.  She was notified by Vashti Hey RN.         Prior Anticoagulation Instructions: INR 3.7 Hold coumadin tonight then dcrease dose to 4mg  once daily except 2mg  on Mondays  Current Anticoagulation Instructions: INR 2.1 Continue coumadin 4mg  once daily except 2mg  on Mondays

## 2010-09-05 ENCOUNTER — Encounter (INDEPENDENT_AMBULATORY_CARE_PROVIDER_SITE_OTHER): Payer: Medicare Other

## 2010-09-05 ENCOUNTER — Encounter: Payer: Self-pay | Admitting: Cardiology

## 2010-09-05 DIAGNOSIS — I4891 Unspecified atrial fibrillation: Secondary | ICD-10-CM

## 2010-09-05 DIAGNOSIS — Z7901 Long term (current) use of anticoagulants: Secondary | ICD-10-CM

## 2010-09-09 NOTE — Medication Information (Signed)
Summary: ccr-lr  Anticoagulant Therapy  Managed by: Vashti Hey, RN PCP: Donzetta Sprung, MD Supervising MD: Andee Lineman MD, Michelle Piper Indication 1: Atrial Fibrillation (ICD-427.31) Lab Used: [facility] [place] Site: [facility] Clinic INR POC 2.7  Dietary changes: no    Health status changes: no    Bleeding/hemorrhagic complications: no    Recent/future hospitalizations: no    Any changes in medication regimen? no    Recent/future dental: no  Any missed doses?: no       Is patient compliant with meds? yes       Allergies: 1)  ! Pcn  Anticoagulation Management History:      The patient is taking warfarin and comes in today for a routine follow up visit.  Positive risk factors for bleeding include an age of 75 years or older.  The bleeding index is 'intermediate risk'.  Positive CHADS2 values include Age > 1 years old.  The start date was 07/20/2001.  Anticoagulation responsible provider: Andee Lineman MD, Michelle Piper.  INR POC: 2.7.  Cuvette Lot#: [id number].  Exp: 10/11.    Anticoagulation Management Assessment/Plan:      The patient's current anticoagulation dose is Coumadin 4 mg tabs: TAD.  The target INR is 2 - 3.  The next INR is due 10/07/2010.  Anticoagulation instructions were given to patient.  Results were reviewed/authorized by Vashti Hey, RN.  She was notified by Vashti Hey RN.         Prior Anticoagulation Instructions: INR 2.1 Continue coumadin 4mg  once daily except 2mg  on Mondays  Current Anticoagulation Instructions: INR 2.7 Continue coumadin 4mg  once daily except 2mg  on Mondays

## 2010-10-06 ENCOUNTER — Encounter: Payer: Self-pay | Admitting: Cardiology

## 2010-10-06 DIAGNOSIS — I4891 Unspecified atrial fibrillation: Secondary | ICD-10-CM

## 2010-10-06 DIAGNOSIS — Z7901 Long term (current) use of anticoagulants: Secondary | ICD-10-CM

## 2010-10-07 ENCOUNTER — Ambulatory Visit (INDEPENDENT_AMBULATORY_CARE_PROVIDER_SITE_OTHER): Payer: Medicare Other | Admitting: *Deleted

## 2010-10-07 DIAGNOSIS — I4891 Unspecified atrial fibrillation: Secondary | ICD-10-CM

## 2010-10-07 DIAGNOSIS — Z7901 Long term (current) use of anticoagulants: Secondary | ICD-10-CM

## 2010-11-04 ENCOUNTER — Ambulatory Visit (INDEPENDENT_AMBULATORY_CARE_PROVIDER_SITE_OTHER): Payer: Medicare Other | Admitting: *Deleted

## 2010-11-04 DIAGNOSIS — I4891 Unspecified atrial fibrillation: Secondary | ICD-10-CM

## 2010-11-04 DIAGNOSIS — Z7901 Long term (current) use of anticoagulants: Secondary | ICD-10-CM

## 2010-11-11 NOTE — Assessment & Plan Note (Signed)
[facility]                          [facility]   Sharon Little, Sharon Little                       MRN:          [id number]  DATE:01/03/2008                            DOB:          05-20-1929    Sharon Little is here for cardiology followup and for clearance for her  eye surgery to be done in the near future.  She does have chronic atrial  fibrillation.  Her rate is controlled.  She is not having any  significant symptoms.  She is not having any chest pain.  There is no  syncope or presyncope.  She has been on Coumadin.  Her Coumadin can be  held if needed for her eye surgery.   PAST MEDICAL HISTORY:   ALLERGIES:  PENICILLIN (RASH).   MEDICATIONS:  1. Coumadin 4 mg as directed.  2. Multivitamin.  3. Glucosamine.  4. Fish oil.  5. Diltiazem extended release 240 mg daily.  6. Furosemide 40 mg daily.  7. Hydrochlorothiazide 25 mg daily.  8. Lovastatin 20.   OTHER MEDICAL PROBLEMS:  See the list below.   REVIEW OF SYSTEMS:  She feels fit as a fiddle.  However, she does say  that she has some back spasms and that these are being looked at with  her chiropractor.  Otherwise, her review of systems is negative.   PHYSICAL EXAM:  Weight is 199 pounds.  Blood pressure is 122/69 with a  pulse of 66.  The patient is oriented to person, time and place.  Affect is normal.  HEENT:  Reveals no xanthelasma.  She has normal extraocular motion.  There are no carotid bruits.  There is no jugular venous distention.  LUNGS:  Clear.  Respiratory effort is not labored.  CARDIAC:  An S1 with an S2.  Her rhythm is irregular, but the rate is  controlled.  There are no significant murmurs.  ABDOMEN:  Soft.  She has large ankles but no marked peripheral edema.   EKG is not done today.  She had one with Dr. Reuel Boom over the past  months, and we are trying to get a copy of that.   PROBLEMS:  1. Chronic atrial fibrillation.  Her rate is controlled.  On  physical      exam, I suspect that she is in atrial fibrillation.  I actually now      have a copy of that EKG that was done on Nov 17, 2007.  It shows      that she has atrial fibrillation with a controlled rate.  She also      has a nonspecific intraventricular conduction delay that is old..  2. Coumadin therapy.  As mentioned, if it is necessary, this could be      held for eye surgery.  3. History of normal left ventricular function.  4. Mild mitral regurgitation.  Her last echocardiogram was done in      June 2007.  There is no reason to repeat it at this time.  5. History of mild chronic lower extremity edema.  She has very little  at this time, and she is stable.  6. Dyslipidemia.  She is on lovastatin.  7. Need for cataract surgery.  The patient is cleared for this.  Her      cardiac status is stable.  She does not need any other testing.     Luis Abed, MD, [facility]  Electronically Signed    JDK/MedQ  DD: 01/03/2008  DT: 01/03/2008  Job #: [id number]   cc:   Donzetta Sprung  [facility] [place], [place]

## 2010-11-11 NOTE — Assessment & Plan Note (Signed)
[facility]                          [facility]   LASHUN, RAMSEYER                       MRN:          [id number]  DATE:11/15/2006                            DOB:          02/02/1929    Ms. Urschel is doing well. She has atrial fib. She does not had any  significant symptoms. She is not having any chest pain. She has no major  shortness of breath. Dr. Reuel Boom had adjusted her diuretic dosing and it  is working well.   PAST MEDICAL HISTORY:   ALLERGIES:  PENICILLIN.   MEDICATIONS:  Coumadin, multivitamin, glucosamine, Lasix, Letia, fish  oil, diltiazem ER 240 once daily.   OTHER MEDICAL PROBLEMS:  See the list below.   REVIEW OF SYSTEMS:  She is feeling well and her review of systems is  negative.   PHYSICAL EXAMINATION:  VITAL SIGNS:  Weight is 207 pounds, blood  pressure is 121/78 with a pulse of 78.  GENERAL:  The patient is oriented to person, time and place and her  affect is normal.  HEENT:  No xanthelasma. She has normal extraocular motion. She has no  carotid bruits. There is no jugular venous distention.  LUNGS:  Clear. Respiratory effort is not labored.  CARDIAC:  Reveals an S1 with an S2. There are no clicks or significant  murmurs.  ABDOMEN:  Obese but soft. There is no peripheral edema.   EKG reveals atrial fibrillation with a controlled rate.   PROBLEMS:  1. Chronic atrial fibrillation, rate controlled.  2. Coumadin.  3. Normal left ventricular function.  4. Mild mitral regurgitation.  5. Mild chronic lower extremity edema which is stable at this time and      very limited.  6. Dyslipidemia. The patient is stable. I will see her in cardiology      followup in one year.     Luis Abed, MD, [facility]  Electronically Signed   JDK/MedQ  DD: 11/15/2006  DT: 11/15/2006  Job #: [id number]   cc:   Donzetta Sprung

## 2010-11-11 NOTE — Assessment & Plan Note (Signed)
[facility]                          [facility]   ROYAL, BEIRNE                       MRN:          [id number]  DATE:01/16/2009                            DOB:          05-29-1929    Ms. Cifelli is seen for cardiology followup.  She has chronic atrial  fibrillation.  Her rate has been controlled over time.  She is not  having chest pain.  She does not note palpitations.  She is not having  any syncope.  She is on Coumadin.  She mentions to me that she has  fallen on 1 occasion going up some stairs.  She did not have true  syncope.  We discussed this carefully at length.  I do not think she is  at high risk but I urged her to be very careful with her ambulation.  If  she were to have a pattern of increased falling, we might have to stop  her Coumadin.   The patient has normal LV function.  She also has a history of mild  mitral regurgitation.  Last echo was done in June 2007 and does not need  to be repeated.  She has mild chronic lower extremity edema that is  stable.   PAST MEDICAL HISTORY:  Allergies:  To PENICILLIN.   Medications:  Vitamins, fish oil, diltiazem 240, furosemide 40,  hydrochlorothiazide 25, lovastatin, Coumadin and vitamin D.   Other medical problems:  See the complete list on my note of January 03, 2008.   REVIEW OF SYSTEMS:  The patient has no fevers, chills or skin rashes.  There is no headache.  There is no change her vision or hearing.  She  has no headaches.  There is no shortness of breath.  There is no cough.  There is no chest pain.  She has no GI or GU symptoms.  She has no major  musculoskeletal symptoms.  She has no unusual sweating.  All other  systems are reviewed and are negative.   PHYSICAL EXAMINATION:  Blood pressure is 110/67 with a pulse of 70.  The patient is oriented to person, time and place.  Affect is normal.  HEENT:  Reveals no xanthelasma.  She has normal extraocular motion.  There are no carotid bruits.  There is no jugular venous distention.  LUNGS:  Clear.  Respiratory effort is not labored.  CARDIAC EXAM:  Reveals an S1 with an S2.  There are no clicks or  significant murmurs.  ABDOMEN:  Soft.  She has trace peripheral edema.   EKG reveals atrial fibrillation with a controlled rate.  She has an old  intraventricular conduction delay.   PROBLEM LIST:  1. Chronic atrial fibrillation.  The heart rate is controlled.  She is      on Coumadin.  2. Coumadin therapy.  As mentioned, we need to be very careful to be      sure that she is not having falling that could be risky to her.  3. Normal left ventricular function.  4. Mild mitral regurgitation.  5. History of mild chronic  lower extremity edema that is venous.  6. Dyslipidemia, on medication.   Cardiac status is stable.  I will see her back in 1 year.     Luis Abed, MD, [facility]  Electronically Signed    JDK/MedQ  DD: 01/16/2009  DT: 01/16/2009  Job #: [id number]   cc:   Donzetta Sprung

## 2010-11-14 NOTE — Assessment & Plan Note (Signed)
[facility]                            [facility]   CORNESHIA, HINES                       MRN:          [id number]  DATE:04/09/2006                            DOB:          1929/04/01    Primary cardiologist is Dr. Willa Rough.   REASON FOR OFFICE VISIT:  Scheduled 87-month followup.  Please refer to my  office note of July 9 for full details.   Since her last visit, Ms. Bober reports that she is feeling much better.  She has much less exertional dyspnea, and no tachy palpitations.  She is  easily able to climb a flight of stairs with no associated dyspnea or chest  discomfort.   Of note, the patient reports having had recent up-titration of her diltiazem  to the current dose of 240 mg daily.  Since this adjustment, she states that  she has noted a palpable difference in her level of dyspnea.   CURRENT MEDICATIONS:  1. Diltiazem ER 240 mg daily.  2. Hydrochlorothiazide 25 mg daily.  3. Fish oil 2000 mg daily.  4. Zetia 10 mg daily.  5. Lasix 40 mg daily.  6. Glucosamine sulfate.  7. Coumadin 4 mg as directed.   PHYSICAL EXAMINATION:  Blood pressure 126/72, pulse 80, irregular.  Weight  200.8 (down 5 pounds).  GENERAL:  77 year old female in no apparent distress.  NECK:  Palpable bilateral carotid pulses without bruits; no JVD.  LUNGS:  Clear to auscultation all fields.  HEART:  Irregularly irregular (S1, S2), no significant murmur.  ABDOMEN:  Soft, nontender.  EXTREMITIES:  2+ bilateral nonpitting edema.  NEUROLOGIC:  No focal deficits.   IMPRESSION:  1. Permanent atrial fibrillation.  Adequately rate-controlled on current      regimen.  2. Chronic Coumadin.  3. Normal left ventricular function.  4. Mild mitral regurgitation.  5. Chronic lower extremity edema - stable.  6. Dyslipidemia.   PLAN:  1. Continue current medication regimen.  2. Resume followup with Dr. Willa Rough in 6 months.      ______________________________  Rozell Searing, PA-C    ______________________________  Luis Abed, MD, [facility]    GS/MedQ  DD:  04/09/2006  DT:  04/11/2006  Job #:  [id number]

## 2010-12-02 ENCOUNTER — Ambulatory Visit (INDEPENDENT_AMBULATORY_CARE_PROVIDER_SITE_OTHER): Payer: Medicare Other | Admitting: *Deleted

## 2010-12-02 DIAGNOSIS — Z7901 Long term (current) use of anticoagulants: Secondary | ICD-10-CM

## 2010-12-02 DIAGNOSIS — R0989 Other specified symptoms and signs involving the circulatory and respiratory systems: Secondary | ICD-10-CM

## 2010-12-02 DIAGNOSIS — I4891 Unspecified atrial fibrillation: Secondary | ICD-10-CM

## 2010-12-30 ENCOUNTER — Ambulatory Visit (INDEPENDENT_AMBULATORY_CARE_PROVIDER_SITE_OTHER): Payer: Medicare Other | Admitting: *Deleted

## 2010-12-30 DIAGNOSIS — I4891 Unspecified atrial fibrillation: Secondary | ICD-10-CM

## 2010-12-30 DIAGNOSIS — Z7901 Long term (current) use of anticoagulants: Secondary | ICD-10-CM

## 2011-01-27 ENCOUNTER — Ambulatory Visit (INDEPENDENT_AMBULATORY_CARE_PROVIDER_SITE_OTHER): Payer: Medicare Other | Admitting: *Deleted

## 2011-01-27 ENCOUNTER — Encounter: Payer: Self-pay | Admitting: Cardiology

## 2011-01-27 DIAGNOSIS — Z7901 Long term (current) use of anticoagulants: Secondary | ICD-10-CM

## 2011-01-27 DIAGNOSIS — I4891 Unspecified atrial fibrillation: Secondary | ICD-10-CM

## 2011-01-27 LAB — POCT INR: INR: 2.3

## 2011-02-01 ENCOUNTER — Encounter: Payer: Self-pay | Admitting: Cardiology

## 2011-02-01 DIAGNOSIS — Z7901 Long term (current) use of anticoagulants: Secondary | ICD-10-CM | POA: Insufficient documentation

## 2011-02-01 DIAGNOSIS — I340 Nonrheumatic mitral (valve) insufficiency: Secondary | ICD-10-CM | POA: Insufficient documentation

## 2011-02-01 DIAGNOSIS — R9430 Abnormal result of cardiovascular function study, unspecified: Secondary | ICD-10-CM | POA: Insufficient documentation

## 2011-02-01 DIAGNOSIS — R0789 Other chest pain: Secondary | ICD-10-CM | POA: Insufficient documentation

## 2011-02-01 DIAGNOSIS — R9389 Abnormal findings on diagnostic imaging of other specified body structures: Secondary | ICD-10-CM | POA: Insufficient documentation

## 2011-02-01 DIAGNOSIS — E785 Hyperlipidemia, unspecified: Secondary | ICD-10-CM | POA: Insufficient documentation

## 2011-02-01 DIAGNOSIS — I454 Nonspecific intraventricular block: Secondary | ICD-10-CM | POA: Insufficient documentation

## 2011-02-01 DIAGNOSIS — R609 Edema, unspecified: Secondary | ICD-10-CM | POA: Insufficient documentation

## 2011-02-02 ENCOUNTER — Encounter: Payer: Self-pay | Admitting: Cardiology

## 2011-02-02 ENCOUNTER — Ambulatory Visit (INDEPENDENT_AMBULATORY_CARE_PROVIDER_SITE_OTHER): Payer: Medicare Other | Admitting: Cardiology

## 2011-02-02 VITALS — BP 119/72 | HR 77 | Ht 66.0 in | Wt 212.0 lb

## 2011-02-02 DIAGNOSIS — R0789 Other chest pain: Secondary | ICD-10-CM

## 2011-02-02 DIAGNOSIS — I4891 Unspecified atrial fibrillation: Secondary | ICD-10-CM

## 2011-02-02 NOTE — Assessment & Plan Note (Signed)
She has had some slight chest discomfort.  I feel it is not cardiac in origin.  No further workup.  One-year followup.

## 2011-02-02 NOTE — Assessment & Plan Note (Signed)
Atrial fib rate is controlled.  She is on Coumadin.  No further workup.

## 2011-02-02 NOTE — Progress Notes (Signed)
HPI Patient is seen today for followup atrial fibrillation.  I saw her last one year ago.  She actually is doing well.  She is on Coumadin.  She has not had syncope or presyncope.  She has some mild dizziness when standing rapidly at times.  She's had very rare chest discomfort at nighttime at rest.  This does not sound like an anginal Allergies  Allergen Reactions  . Penicillins     REACTION: rash    Current Outpatient Prescriptions  Medication Sig Dispense Refill  . acetaminophen (TYLENOL) 650 MG CR tablet Take 650 mg by mouth every 8 (eight) hours as needed.        . Cholecalciferol (VITAMIN D3) 1000 UNITS tablet Take 1,000 Units by mouth daily.        Marland Kitchen diltiazem (CARDIZEM CD) 240 MG 24 hr capsule Take 240 mg by mouth daily.       . furosemide (LASIX) 40 MG tablet Take 1 tablet by mouth Twice daily.      . hydrochlorothiazide 25 MG tablet Take 25 mg by mouth daily.        Marland Kitchen lovastatin (MEVACOR) 20 MG tablet Take 20 mg by mouth daily.        . Multiple Vitamin (MULTIVITAMIN) tablet Take 1 tablet by mouth daily.        . Omega-3 Fatty Acids (FISH OIL) 1200 MG CAPS Take 1 capsule by mouth 2 (two) times daily.        Marland Kitchen warfarin (COUMADIN) 4 MG tablet Take by mouth as directed.         History   Social History  . Marital Status: Divorced    Spouse Name: N/A    Number of Children: N/A  . Years of Education: N/A   Occupational History  . Not on file.   Social History Main Topics  . Smoking status: Former Smoker -- 2.0 packs/day for 24 years    Types: Cigarettes    Quit date: 06/29/1968  . Smokeless tobacco: Never Used  . Alcohol Use: Not on file  . Drug Use: Not on file  . Sexually Active: Not on file   Other Topics Concern  . Not on file   Social History Narrative   Divorced, retired.     No family history on file.  Past Medical History  Diagnosis Date  . Dyslipidemia     mixed  . Edema     Chronic lower extremity edema  . MR (mitral regurgitation)     mild;  echo 6/07 (echo also showed nml LV function)  . A-fib   . IVCD (intraventricular conduction defect)   . Abnormal CT scan, chest     2005-2007; stable liver mass; also hypodense liver lesions   . Ejection fraction     EF normal, echo, 2007  . Warfarin anticoagulation   . Chest discomfort     Nuclear, 2007, no ischemia, possible attenuation artifact    Past Surgical History  Procedure Date  . Nuclear test 6/07    no ischemia, possible attenuation artifact     ROS  Patient denies fever, chills, headache, sweats, rash, change in vision, change in hearing, cough, nausea vomiting, urinary symptoms.  All other systems are reviewed and are negative.  PHYSICAL EXAM Patient is quite stable.  She is overweight.  She is oriented to person time and place.  Affect is normal.  Head is atraumatic.  There is no jugular venous distention.  Lungs are clear.  Respiratory effort  is nonlabored.  Cardiac exam reveals S1-S2.  The rhythm is irregularly irregular.  The abdomen is soft.  There is no significant peripheral edema. Filed Vitals:   02/02/11 1502  BP: 119/72  Pulse: 77  Height: 5\' 6"  (1.676 m)  Weight: 212 lb (96.163 kg)    EKG Is done today and reviewed by me.  She has old interventricular conduction delay.  There is old atrial fibrillation.  The rate is controlled.  ASSESSMENT & PLAN

## 2011-02-02 NOTE — Patient Instructions (Signed)
Continue all current medications. Your physician wants you to follow up in:  1 year.  You will receive a reminder letter in the mail one-two months in advance.  If you don't receive a letter, please call our office to schedule the follow up appointment   

## 2011-02-24 ENCOUNTER — Ambulatory Visit (INDEPENDENT_AMBULATORY_CARE_PROVIDER_SITE_OTHER): Payer: Medicare Other | Admitting: *Deleted

## 2011-02-24 DIAGNOSIS — Z7901 Long term (current) use of anticoagulants: Secondary | ICD-10-CM

## 2011-02-24 DIAGNOSIS — I4891 Unspecified atrial fibrillation: Secondary | ICD-10-CM

## 2011-03-24 ENCOUNTER — Ambulatory Visit (INDEPENDENT_AMBULATORY_CARE_PROVIDER_SITE_OTHER): Payer: Medicare Other | Admitting: *Deleted

## 2011-03-24 DIAGNOSIS — I4891 Unspecified atrial fibrillation: Secondary | ICD-10-CM

## 2011-03-24 DIAGNOSIS — Z7901 Long term (current) use of anticoagulants: Secondary | ICD-10-CM

## 2011-03-24 LAB — POCT INR: INR: 2.7

## 2011-04-21 ENCOUNTER — Ambulatory Visit (INDEPENDENT_AMBULATORY_CARE_PROVIDER_SITE_OTHER): Payer: Medicare Other | Admitting: *Deleted

## 2011-04-21 DIAGNOSIS — I4891 Unspecified atrial fibrillation: Secondary | ICD-10-CM

## 2011-04-21 DIAGNOSIS — Z7901 Long term (current) use of anticoagulants: Secondary | ICD-10-CM

## 2011-06-02 ENCOUNTER — Ambulatory Visit (INDEPENDENT_AMBULATORY_CARE_PROVIDER_SITE_OTHER): Payer: Medicare Other | Admitting: *Deleted

## 2011-06-02 DIAGNOSIS — I4891 Unspecified atrial fibrillation: Secondary | ICD-10-CM

## 2011-06-02 DIAGNOSIS — Z7901 Long term (current) use of anticoagulants: Secondary | ICD-10-CM

## 2011-07-14 ENCOUNTER — Ambulatory Visit (INDEPENDENT_AMBULATORY_CARE_PROVIDER_SITE_OTHER): Payer: Medicare Other | Admitting: *Deleted

## 2011-07-14 DIAGNOSIS — I4891 Unspecified atrial fibrillation: Secondary | ICD-10-CM | POA: Diagnosis not present

## 2011-07-14 DIAGNOSIS — Z7901 Long term (current) use of anticoagulants: Secondary | ICD-10-CM

## 2011-07-14 LAB — POCT INR: INR: 2.8

## 2011-08-11 DIAGNOSIS — I10 Essential (primary) hypertension: Secondary | ICD-10-CM | POA: Diagnosis not present

## 2011-08-11 DIAGNOSIS — E782 Mixed hyperlipidemia: Secondary | ICD-10-CM | POA: Diagnosis not present

## 2011-08-17 DIAGNOSIS — E782 Mixed hyperlipidemia: Secondary | ICD-10-CM | POA: Diagnosis not present

## 2011-08-17 DIAGNOSIS — B0229 Other postherpetic nervous system involvement: Secondary | ICD-10-CM | POA: Diagnosis not present

## 2011-08-17 DIAGNOSIS — M76899 Other specified enthesopathies of unspecified lower limb, excluding foot: Secondary | ICD-10-CM | POA: Diagnosis not present

## 2011-08-17 DIAGNOSIS — I10 Essential (primary) hypertension: Secondary | ICD-10-CM | POA: Diagnosis not present

## 2011-08-17 DIAGNOSIS — I509 Heart failure, unspecified: Secondary | ICD-10-CM | POA: Diagnosis not present

## 2011-08-17 DIAGNOSIS — K219 Gastro-esophageal reflux disease without esophagitis: Secondary | ICD-10-CM | POA: Diagnosis not present

## 2011-08-17 DIAGNOSIS — I4891 Unspecified atrial fibrillation: Secondary | ICD-10-CM | POA: Diagnosis not present

## 2011-08-25 ENCOUNTER — Ambulatory Visit (INDEPENDENT_AMBULATORY_CARE_PROVIDER_SITE_OTHER): Payer: Medicare Other | Admitting: *Deleted

## 2011-08-25 DIAGNOSIS — I4891 Unspecified atrial fibrillation: Secondary | ICD-10-CM | POA: Diagnosis not present

## 2011-08-25 DIAGNOSIS — Z7901 Long term (current) use of anticoagulants: Secondary | ICD-10-CM | POA: Diagnosis not present

## 2011-08-25 LAB — POCT INR: INR: 2.6

## 2011-09-02 DIAGNOSIS — B0229 Other postherpetic nervous system involvement: Secondary | ICD-10-CM | POA: Diagnosis not present

## 2011-09-02 DIAGNOSIS — I4891 Unspecified atrial fibrillation: Secondary | ICD-10-CM | POA: Diagnosis not present

## 2011-09-10 DIAGNOSIS — I4891 Unspecified atrial fibrillation: Secondary | ICD-10-CM | POA: Diagnosis not present

## 2011-09-10 DIAGNOSIS — B0229 Other postherpetic nervous system involvement: Secondary | ICD-10-CM | POA: Diagnosis not present

## 2011-10-05 DIAGNOSIS — B0229 Other postherpetic nervous system involvement: Secondary | ICD-10-CM | POA: Diagnosis not present

## 2011-10-05 DIAGNOSIS — M545 Low back pain: Secondary | ICD-10-CM | POA: Diagnosis not present

## 2011-10-06 ENCOUNTER — Ambulatory Visit (INDEPENDENT_AMBULATORY_CARE_PROVIDER_SITE_OTHER): Payer: Medicare Other | Admitting: *Deleted

## 2011-10-06 DIAGNOSIS — I4891 Unspecified atrial fibrillation: Secondary | ICD-10-CM | POA: Diagnosis not present

## 2011-10-06 DIAGNOSIS — Z7901 Long term (current) use of anticoagulants: Secondary | ICD-10-CM | POA: Diagnosis not present

## 2011-10-30 DIAGNOSIS — M67919 Unspecified disorder of synovium and tendon, unspecified shoulder: Secondary | ICD-10-CM | POA: Diagnosis not present

## 2011-10-30 DIAGNOSIS — M1990 Unspecified osteoarthritis, unspecified site: Secondary | ICD-10-CM | POA: Diagnosis not present

## 2011-10-30 DIAGNOSIS — M719 Bursopathy, unspecified: Secondary | ICD-10-CM | POA: Diagnosis not present

## 2011-11-17 ENCOUNTER — Ambulatory Visit (INDEPENDENT_AMBULATORY_CARE_PROVIDER_SITE_OTHER): Payer: Medicare Other | Admitting: *Deleted

## 2011-11-17 DIAGNOSIS — Z7901 Long term (current) use of anticoagulants: Secondary | ICD-10-CM | POA: Diagnosis not present

## 2011-11-17 DIAGNOSIS — I4891 Unspecified atrial fibrillation: Secondary | ICD-10-CM | POA: Diagnosis not present

## 2011-12-14 DIAGNOSIS — R300 Dysuria: Secondary | ICD-10-CM | POA: Diagnosis not present

## 2011-12-14 DIAGNOSIS — R1011 Right upper quadrant pain: Secondary | ICD-10-CM | POA: Diagnosis not present

## 2011-12-15 ENCOUNTER — Ambulatory Visit (INDEPENDENT_AMBULATORY_CARE_PROVIDER_SITE_OTHER): Payer: Medicare Other | Admitting: *Deleted

## 2011-12-15 DIAGNOSIS — I10 Essential (primary) hypertension: Secondary | ICD-10-CM | POA: Diagnosis not present

## 2011-12-15 DIAGNOSIS — K219 Gastro-esophageal reflux disease without esophagitis: Secondary | ICD-10-CM | POA: Diagnosis not present

## 2011-12-15 DIAGNOSIS — R300 Dysuria: Secondary | ICD-10-CM | POA: Diagnosis not present

## 2011-12-15 DIAGNOSIS — E782 Mixed hyperlipidemia: Secondary | ICD-10-CM | POA: Diagnosis not present

## 2011-12-15 DIAGNOSIS — Z7901 Long term (current) use of anticoagulants: Secondary | ICD-10-CM | POA: Diagnosis not present

## 2011-12-15 DIAGNOSIS — E7800 Pure hypercholesterolemia, unspecified: Secondary | ICD-10-CM | POA: Diagnosis not present

## 2011-12-15 DIAGNOSIS — R1011 Right upper quadrant pain: Secondary | ICD-10-CM | POA: Diagnosis not present

## 2011-12-15 DIAGNOSIS — I4891 Unspecified atrial fibrillation: Secondary | ICD-10-CM

## 2011-12-15 LAB — POCT INR: INR: 2.6

## 2011-12-17 DIAGNOSIS — K7689 Other specified diseases of liver: Secondary | ICD-10-CM | POA: Diagnosis not present

## 2011-12-17 DIAGNOSIS — R1011 Right upper quadrant pain: Secondary | ICD-10-CM | POA: Diagnosis not present

## 2011-12-24 DIAGNOSIS — E782 Mixed hyperlipidemia: Secondary | ICD-10-CM | POA: Diagnosis not present

## 2011-12-24 DIAGNOSIS — I509 Heart failure, unspecified: Secondary | ICD-10-CM | POA: Diagnosis not present

## 2011-12-24 DIAGNOSIS — B0229 Other postherpetic nervous system involvement: Secondary | ICD-10-CM | POA: Diagnosis not present

## 2011-12-24 DIAGNOSIS — M1990 Unspecified osteoarthritis, unspecified site: Secondary | ICD-10-CM | POA: Diagnosis not present

## 2011-12-24 DIAGNOSIS — I4891 Unspecified atrial fibrillation: Secondary | ICD-10-CM | POA: Diagnosis not present

## 2011-12-24 DIAGNOSIS — K219 Gastro-esophageal reflux disease without esophagitis: Secondary | ICD-10-CM | POA: Diagnosis not present

## 2011-12-24 DIAGNOSIS — I10 Essential (primary) hypertension: Secondary | ICD-10-CM | POA: Diagnosis not present

## 2012-01-01 ENCOUNTER — Ambulatory Visit (INDEPENDENT_AMBULATORY_CARE_PROVIDER_SITE_OTHER): Payer: Medicare Other | Admitting: *Deleted

## 2012-01-01 DIAGNOSIS — I4891 Unspecified atrial fibrillation: Secondary | ICD-10-CM

## 2012-01-01 DIAGNOSIS — Z7901 Long term (current) use of anticoagulants: Secondary | ICD-10-CM | POA: Diagnosis not present

## 2012-01-01 LAB — POCT INR: INR: 2.2

## 2012-01-14 DIAGNOSIS — R29898 Other symptoms and signs involving the musculoskeletal system: Secondary | ICD-10-CM | POA: Diagnosis not present

## 2012-01-14 DIAGNOSIS — M47812 Spondylosis without myelopathy or radiculopathy, cervical region: Secondary | ICD-10-CM | POA: Diagnosis not present

## 2012-01-14 DIAGNOSIS — M5030 Other cervical disc degeneration, unspecified cervical region: Secondary | ICD-10-CM | POA: Diagnosis not present

## 2012-01-14 DIAGNOSIS — M5020 Other cervical disc displacement, unspecified cervical region: Secondary | ICD-10-CM | POA: Diagnosis not present

## 2012-01-29 ENCOUNTER — Ambulatory Visit (INDEPENDENT_AMBULATORY_CARE_PROVIDER_SITE_OTHER): Payer: Medicare Other | Admitting: *Deleted

## 2012-01-29 DIAGNOSIS — Z7901 Long term (current) use of anticoagulants: Secondary | ICD-10-CM

## 2012-01-29 DIAGNOSIS — I4891 Unspecified atrial fibrillation: Secondary | ICD-10-CM | POA: Diagnosis not present

## 2012-01-29 LAB — POCT INR: INR: 3.4

## 2012-02-09 ENCOUNTER — Encounter: Payer: Self-pay | Admitting: Cardiology

## 2012-02-09 ENCOUNTER — Ambulatory Visit (INDEPENDENT_AMBULATORY_CARE_PROVIDER_SITE_OTHER): Payer: Medicare Other | Admitting: Cardiology

## 2012-02-09 VITALS — BP 110/60 | HR 79 | Ht 67.0 in | Wt 203.8 lb

## 2012-02-09 DIAGNOSIS — M531 Cervicobrachial syndrome: Secondary | ICD-10-CM | POA: Diagnosis not present

## 2012-02-09 DIAGNOSIS — I4891 Unspecified atrial fibrillation: Secondary | ICD-10-CM

## 2012-02-09 DIAGNOSIS — M9981 Other biomechanical lesions of cervical region: Secondary | ICD-10-CM | POA: Diagnosis not present

## 2012-02-09 DIAGNOSIS — I059 Rheumatic mitral valve disease, unspecified: Secondary | ICD-10-CM

## 2012-02-09 DIAGNOSIS — R9430 Abnormal result of cardiovascular function study, unspecified: Secondary | ICD-10-CM

## 2012-02-09 DIAGNOSIS — M549 Dorsalgia, unspecified: Secondary | ICD-10-CM | POA: Insufficient documentation

## 2012-02-09 DIAGNOSIS — Z7901 Long term (current) use of anticoagulants: Secondary | ICD-10-CM

## 2012-02-09 DIAGNOSIS — R609 Edema, unspecified: Secondary | ICD-10-CM | POA: Diagnosis not present

## 2012-02-09 DIAGNOSIS — I340 Nonrheumatic mitral (valve) insufficiency: Secondary | ICD-10-CM

## 2012-02-09 DIAGNOSIS — R0989 Other specified symptoms and signs involving the circulatory and respiratory systems: Secondary | ICD-10-CM

## 2012-02-09 DIAGNOSIS — R0789 Other chest pain: Secondary | ICD-10-CM

## 2012-02-09 NOTE — Assessment & Plan Note (Signed)
Patient has chronic atrial fibrillation. The rate is controlled. She is anticoagulated appropriately. No change in therapy.

## 2012-02-09 NOTE — Assessment & Plan Note (Signed)
Historically her LV function is normal. I've chosen not to repeat an echo at this time.

## 2012-02-09 NOTE — Assessment & Plan Note (Signed)
She continues on Coumadin. No change in therapy. 

## 2012-02-09 NOTE — Assessment & Plan Note (Signed)
She has had no recurrent significant chest pain. No further workup is needed.

## 2012-02-09 NOTE — Assessment & Plan Note (Signed)
The patient has had mild mitral regurgitation in the past. I've chosen not to repeat an echo over time as it has been mild and she is asymptomatic.

## 2012-02-09 NOTE — Assessment & Plan Note (Signed)
She will be seeing Dr. Channing Mutters about her back pain. If surgery is considered, I will need to see her for followup to decide if any other testing is needed.  We may want to at least get an echo to reassess LV function.

## 2012-02-09 NOTE — Assessment & Plan Note (Signed)
The patient's edema is under control on her current medications. No change in therapy.

## 2012-02-09 NOTE — Progress Notes (Signed)
HPI  Patient is seen today to followup atrial fibrillation. She is not having any significant chest pain. Her atrial fibrillation is controlled. She's been having some back pain. She is scheduled to see Dr. Channing Mutters. I will send a copy to him of my records.  Allergies  Allergen Reactions  . Penicillins     REACTION: rash    Current Outpatient Prescriptions  Medication Sig Dispense Refill  . acetaminophen (TYLENOL) 650 MG CR tablet Take 650 mg by mouth every 8 (eight) hours as needed.        . Cholecalciferol (VITAMIN D3) 1000 UNITS tablet Take 1,000 Units by mouth daily.        Marland Kitchen diltiazem (CARDIZEM CD) 240 MG 24 hr capsule Take 240 mg by mouth daily.       . furosemide (LASIX) 40 MG tablet Take 1 tablet by mouth Twice daily.      . hydrochlorothiazide 25 MG tablet Take 25 mg by mouth daily.        Marland Kitchen lovastatin (MEVACOR) 20 MG tablet Take 20 mg by mouth daily.        . Multiple Vitamin (MULTIVITAMIN) tablet Take 1 tablet by mouth daily.        . Omega-3 Fatty Acids (FISH OIL) 1200 MG CAPS Take 1 capsule by mouth 2 (two) times daily.        Marland Kitchen warfarin (COUMADIN) 4 MG tablet Take by mouth as directed.         History   Social History  . Marital Status: Divorced    Spouse Name: N/A    Number of Children: N/A  . Years of Education: N/A   Occupational History  . Not on file.   Social History Main Topics  . Smoking status: Former Smoker -- 2.0 packs/day for 24 years    Types: Cigarettes    Quit date: 06/29/1968  . Smokeless tobacco: Never Used  . Alcohol Use: Not on file  . Drug Use: Not on file  . Sexually Active: Not on file   Other Topics Concern  . Not on file   Social History Narrative   Divorced, retired.     No family history on file.  Past Medical History  Diagnosis Date  . Dyslipidemia     mixed  . Edema     Chronic lower extremity edema  . MR (mitral regurgitation)     mild; echo 6/07 (echo also showed nml LV function)  . A-fib   . IVCD (intraventricular  conduction defect)   . Abnormal CT scan, chest     2005-2007; stable liver mass; also hypodense liver lesions   . Ejection fraction     EF normal, echo, 2007  . Warfarin anticoagulation   . Chest discomfort     Nuclear, 2007, no ischemia, possible attenuation artifact    Past Surgical History  Procedure Date  . Nuclear test 6/07    no ischemia, possible attenuation artifact     ROS   Patient denies fever, chills, headache, sweats, rash, change in vision, change in hearing, chest pain, cough, nausea vomiting, urinary symptoms. All other systems are reviewed and are negative.  PHYSICAL EXAM   Patient is oriented to person time and place. Affect is normal. She is overweight but she has lost 9 pounds since last year. There is no jugulovenous distention. Lungs are clear. Respiratory effort is nonlabored. Cardiac exam reveals S1 and S2. There are no clicks or significant murmurs. The abdomen is soft. There  is no peripheral edema. There are no skin rashes. There no musculoskeletal deformities.  Filed Vitals:   02/09/12 0824  BP: 110/60  Pulse: 79  Height: 5\' 7"  (1.702 m)  Weight: 203 lb 12 oz (92.42 kg)   EKG is done today and reviewed by me. She has an old interventricular conduction delay. She has old atrial fibrillation. The rate is controlled. I have compared it to her older tracing. There is no significant change.  ASSESSMENT & PLAN

## 2012-02-11 DIAGNOSIS — M531 Cervicobrachial syndrome: Secondary | ICD-10-CM | POA: Diagnosis not present

## 2012-02-11 DIAGNOSIS — M9981 Other biomechanical lesions of cervical region: Secondary | ICD-10-CM | POA: Diagnosis not present

## 2012-02-15 DIAGNOSIS — M9981 Other biomechanical lesions of cervical region: Secondary | ICD-10-CM | POA: Diagnosis not present

## 2012-02-15 DIAGNOSIS — M531 Cervicobrachial syndrome: Secondary | ICD-10-CM | POA: Diagnosis not present

## 2012-02-19 DIAGNOSIS — M9981 Other biomechanical lesions of cervical region: Secondary | ICD-10-CM | POA: Diagnosis not present

## 2012-02-19 DIAGNOSIS — M531 Cervicobrachial syndrome: Secondary | ICD-10-CM | POA: Diagnosis not present

## 2012-02-22 DIAGNOSIS — M531 Cervicobrachial syndrome: Secondary | ICD-10-CM | POA: Diagnosis not present

## 2012-02-22 DIAGNOSIS — M9981 Other biomechanical lesions of cervical region: Secondary | ICD-10-CM | POA: Diagnosis not present

## 2012-02-26 ENCOUNTER — Ambulatory Visit (INDEPENDENT_AMBULATORY_CARE_PROVIDER_SITE_OTHER): Payer: Medicare Other | Admitting: *Deleted

## 2012-02-26 DIAGNOSIS — Z7901 Long term (current) use of anticoagulants: Secondary | ICD-10-CM

## 2012-02-26 DIAGNOSIS — I4891 Unspecified atrial fibrillation: Secondary | ICD-10-CM | POA: Diagnosis not present

## 2012-02-26 LAB — POCT INR: INR: 3

## 2012-03-01 DIAGNOSIS — M531 Cervicobrachial syndrome: Secondary | ICD-10-CM | POA: Diagnosis not present

## 2012-03-01 DIAGNOSIS — M9981 Other biomechanical lesions of cervical region: Secondary | ICD-10-CM | POA: Diagnosis not present

## 2012-03-04 DIAGNOSIS — M531 Cervicobrachial syndrome: Secondary | ICD-10-CM | POA: Diagnosis not present

## 2012-03-04 DIAGNOSIS — M9981 Other biomechanical lesions of cervical region: Secondary | ICD-10-CM | POA: Diagnosis not present

## 2012-03-07 DIAGNOSIS — Z23 Encounter for immunization: Secondary | ICD-10-CM | POA: Diagnosis not present

## 2012-03-08 DIAGNOSIS — M9981 Other biomechanical lesions of cervical region: Secondary | ICD-10-CM | POA: Diagnosis not present

## 2012-03-08 DIAGNOSIS — M531 Cervicobrachial syndrome: Secondary | ICD-10-CM | POA: Diagnosis not present

## 2012-03-11 DIAGNOSIS — M531 Cervicobrachial syndrome: Secondary | ICD-10-CM | POA: Diagnosis not present

## 2012-03-11 DIAGNOSIS — M9981 Other biomechanical lesions of cervical region: Secondary | ICD-10-CM | POA: Diagnosis not present

## 2012-03-14 DIAGNOSIS — M531 Cervicobrachial syndrome: Secondary | ICD-10-CM | POA: Diagnosis not present

## 2012-03-14 DIAGNOSIS — M9981 Other biomechanical lesions of cervical region: Secondary | ICD-10-CM | POA: Diagnosis not present

## 2012-03-18 DIAGNOSIS — M531 Cervicobrachial syndrome: Secondary | ICD-10-CM | POA: Diagnosis not present

## 2012-03-18 DIAGNOSIS — M9981 Other biomechanical lesions of cervical region: Secondary | ICD-10-CM | POA: Diagnosis not present

## 2012-03-25 ENCOUNTER — Ambulatory Visit (INDEPENDENT_AMBULATORY_CARE_PROVIDER_SITE_OTHER): Payer: Medicare Other | Admitting: *Deleted

## 2012-03-25 DIAGNOSIS — I4891 Unspecified atrial fibrillation: Secondary | ICD-10-CM

## 2012-03-25 DIAGNOSIS — Z7901 Long term (current) use of anticoagulants: Secondary | ICD-10-CM | POA: Diagnosis not present

## 2012-03-25 DIAGNOSIS — M4712 Other spondylosis with myelopathy, cervical region: Secondary | ICD-10-CM | POA: Diagnosis not present

## 2012-03-25 DIAGNOSIS — M9981 Other biomechanical lesions of cervical region: Secondary | ICD-10-CM | POA: Diagnosis not present

## 2012-03-25 LAB — POCT INR: INR: 2.5

## 2012-03-29 DIAGNOSIS — M4712 Other spondylosis with myelopathy, cervical region: Secondary | ICD-10-CM | POA: Diagnosis not present

## 2012-03-29 DIAGNOSIS — M9981 Other biomechanical lesions of cervical region: Secondary | ICD-10-CM | POA: Diagnosis not present

## 2012-04-05 DIAGNOSIS — M4712 Other spondylosis with myelopathy, cervical region: Secondary | ICD-10-CM | POA: Diagnosis not present

## 2012-04-05 DIAGNOSIS — M9981 Other biomechanical lesions of cervical region: Secondary | ICD-10-CM | POA: Diagnosis not present

## 2012-04-08 DIAGNOSIS — M9981 Other biomechanical lesions of cervical region: Secondary | ICD-10-CM | POA: Diagnosis not present

## 2012-04-08 DIAGNOSIS — M4712 Other spondylosis with myelopathy, cervical region: Secondary | ICD-10-CM | POA: Diagnosis not present

## 2012-04-12 DIAGNOSIS — M9981 Other biomechanical lesions of cervical region: Secondary | ICD-10-CM | POA: Diagnosis not present

## 2012-04-12 DIAGNOSIS — M4712 Other spondylosis with myelopathy, cervical region: Secondary | ICD-10-CM | POA: Diagnosis not present

## 2012-04-14 DIAGNOSIS — M67919 Unspecified disorder of synovium and tendon, unspecified shoulder: Secondary | ICD-10-CM | POA: Diagnosis not present

## 2012-04-14 DIAGNOSIS — M47812 Spondylosis without myelopathy or radiculopathy, cervical region: Secondary | ICD-10-CM | POA: Diagnosis not present

## 2012-04-15 DIAGNOSIS — M4712 Other spondylosis with myelopathy, cervical region: Secondary | ICD-10-CM | POA: Diagnosis not present

## 2012-04-15 DIAGNOSIS — M9981 Other biomechanical lesions of cervical region: Secondary | ICD-10-CM | POA: Diagnosis not present

## 2012-04-18 DIAGNOSIS — M4712 Other spondylosis with myelopathy, cervical region: Secondary | ICD-10-CM | POA: Diagnosis not present

## 2012-04-18 DIAGNOSIS — M9981 Other biomechanical lesions of cervical region: Secondary | ICD-10-CM | POA: Diagnosis not present

## 2012-04-21 DIAGNOSIS — M4712 Other spondylosis with myelopathy, cervical region: Secondary | ICD-10-CM | POA: Diagnosis not present

## 2012-04-21 DIAGNOSIS — M9981 Other biomechanical lesions of cervical region: Secondary | ICD-10-CM | POA: Diagnosis not present

## 2012-04-22 ENCOUNTER — Ambulatory Visit (INDEPENDENT_AMBULATORY_CARE_PROVIDER_SITE_OTHER): Payer: Medicare Other | Admitting: *Deleted

## 2012-04-22 DIAGNOSIS — I4891 Unspecified atrial fibrillation: Secondary | ICD-10-CM

## 2012-04-22 DIAGNOSIS — Z7901 Long term (current) use of anticoagulants: Secondary | ICD-10-CM

## 2012-04-22 DIAGNOSIS — E782 Mixed hyperlipidemia: Secondary | ICD-10-CM | POA: Diagnosis not present

## 2012-04-22 DIAGNOSIS — I10 Essential (primary) hypertension: Secondary | ICD-10-CM | POA: Diagnosis not present

## 2012-04-22 LAB — POCT INR: INR: 2.2

## 2012-04-25 DIAGNOSIS — M9981 Other biomechanical lesions of cervical region: Secondary | ICD-10-CM | POA: Diagnosis not present

## 2012-04-25 DIAGNOSIS — M4712 Other spondylosis with myelopathy, cervical region: Secondary | ICD-10-CM | POA: Diagnosis not present

## 2012-04-28 DIAGNOSIS — B0229 Other postherpetic nervous system involvement: Secondary | ICD-10-CM | POA: Diagnosis not present

## 2012-04-28 DIAGNOSIS — M1990 Unspecified osteoarthritis, unspecified site: Secondary | ICD-10-CM | POA: Diagnosis not present

## 2012-04-28 DIAGNOSIS — I4891 Unspecified atrial fibrillation: Secondary | ICD-10-CM | POA: Diagnosis not present

## 2012-04-28 DIAGNOSIS — E782 Mixed hyperlipidemia: Secondary | ICD-10-CM | POA: Diagnosis not present

## 2012-04-28 DIAGNOSIS — M9981 Other biomechanical lesions of cervical region: Secondary | ICD-10-CM | POA: Diagnosis not present

## 2012-04-28 DIAGNOSIS — I10 Essential (primary) hypertension: Secondary | ICD-10-CM | POA: Diagnosis not present

## 2012-04-28 DIAGNOSIS — K219 Gastro-esophageal reflux disease without esophagitis: Secondary | ICD-10-CM | POA: Diagnosis not present

## 2012-04-28 DIAGNOSIS — M4712 Other spondylosis with myelopathy, cervical region: Secondary | ICD-10-CM | POA: Diagnosis not present

## 2012-04-28 DIAGNOSIS — I509 Heart failure, unspecified: Secondary | ICD-10-CM | POA: Diagnosis not present

## 2012-05-02 DIAGNOSIS — M531 Cervicobrachial syndrome: Secondary | ICD-10-CM | POA: Diagnosis not present

## 2012-05-02 DIAGNOSIS — M9981 Other biomechanical lesions of cervical region: Secondary | ICD-10-CM | POA: Diagnosis not present

## 2012-05-02 DIAGNOSIS — M4712 Other spondylosis with myelopathy, cervical region: Secondary | ICD-10-CM | POA: Diagnosis not present

## 2012-05-05 DIAGNOSIS — M9981 Other biomechanical lesions of cervical region: Secondary | ICD-10-CM | POA: Diagnosis not present

## 2012-05-05 DIAGNOSIS — M531 Cervicobrachial syndrome: Secondary | ICD-10-CM | POA: Diagnosis not present

## 2012-05-05 DIAGNOSIS — M4712 Other spondylosis with myelopathy, cervical region: Secondary | ICD-10-CM | POA: Diagnosis not present

## 2012-05-19 DIAGNOSIS — Z1231 Encounter for screening mammogram for malignant neoplasm of breast: Secondary | ICD-10-CM | POA: Diagnosis not present

## 2012-05-31 ENCOUNTER — Ambulatory Visit (INDEPENDENT_AMBULATORY_CARE_PROVIDER_SITE_OTHER): Payer: Medicare Other | Admitting: *Deleted

## 2012-05-31 DIAGNOSIS — Z7901 Long term (current) use of anticoagulants: Secondary | ICD-10-CM | POA: Diagnosis not present

## 2012-05-31 DIAGNOSIS — I4891 Unspecified atrial fibrillation: Secondary | ICD-10-CM | POA: Diagnosis not present

## 2012-06-24 DIAGNOSIS — M47817 Spondylosis without myelopathy or radiculopathy, lumbosacral region: Secondary | ICD-10-CM | POA: Diagnosis not present

## 2012-06-24 DIAGNOSIS — M999 Biomechanical lesion, unspecified: Secondary | ICD-10-CM | POA: Diagnosis not present

## 2012-07-01 DIAGNOSIS — M47814 Spondylosis without myelopathy or radiculopathy, thoracic region: Secondary | ICD-10-CM | POA: Diagnosis not present

## 2012-07-01 DIAGNOSIS — M999 Biomechanical lesion, unspecified: Secondary | ICD-10-CM | POA: Diagnosis not present

## 2012-07-08 DIAGNOSIS — M47814 Spondylosis without myelopathy or radiculopathy, thoracic region: Secondary | ICD-10-CM | POA: Diagnosis not present

## 2012-07-08 DIAGNOSIS — M999 Biomechanical lesion, unspecified: Secondary | ICD-10-CM | POA: Diagnosis not present

## 2012-07-12 ENCOUNTER — Ambulatory Visit (INDEPENDENT_AMBULATORY_CARE_PROVIDER_SITE_OTHER): Payer: Medicare Other | Admitting: *Deleted

## 2012-07-12 DIAGNOSIS — I4891 Unspecified atrial fibrillation: Secondary | ICD-10-CM

## 2012-07-12 DIAGNOSIS — Z7901 Long term (current) use of anticoagulants: Secondary | ICD-10-CM

## 2012-07-15 DIAGNOSIS — M47814 Spondylosis without myelopathy or radiculopathy, thoracic region: Secondary | ICD-10-CM | POA: Diagnosis not present

## 2012-07-15 DIAGNOSIS — M5430 Sciatica, unspecified side: Secondary | ICD-10-CM | POA: Diagnosis not present

## 2012-07-15 DIAGNOSIS — S335XXA Sprain of ligaments of lumbar spine, initial encounter: Secondary | ICD-10-CM | POA: Diagnosis not present

## 2012-07-15 DIAGNOSIS — M999 Biomechanical lesion, unspecified: Secondary | ICD-10-CM | POA: Diagnosis not present

## 2012-07-22 DIAGNOSIS — M47817 Spondylosis without myelopathy or radiculopathy, lumbosacral region: Secondary | ICD-10-CM | POA: Diagnosis not present

## 2012-07-22 DIAGNOSIS — S335XXA Sprain of ligaments of lumbar spine, initial encounter: Secondary | ICD-10-CM | POA: Diagnosis not present

## 2012-07-22 DIAGNOSIS — M999 Biomechanical lesion, unspecified: Secondary | ICD-10-CM | POA: Diagnosis not present

## 2012-07-22 DIAGNOSIS — M5430 Sciatica, unspecified side: Secondary | ICD-10-CM | POA: Diagnosis not present

## 2012-07-25 DIAGNOSIS — IMO0002 Reserved for concepts with insufficient information to code with codable children: Secondary | ICD-10-CM | POA: Diagnosis not present

## 2012-07-25 DIAGNOSIS — M1990 Unspecified osteoarthritis, unspecified site: Secondary | ICD-10-CM | POA: Diagnosis not present

## 2012-08-02 ENCOUNTER — Ambulatory Visit (INDEPENDENT_AMBULATORY_CARE_PROVIDER_SITE_OTHER): Payer: Medicare Other | Admitting: *Deleted

## 2012-08-02 DIAGNOSIS — Z7901 Long term (current) use of anticoagulants: Secondary | ICD-10-CM | POA: Diagnosis not present

## 2012-08-02 DIAGNOSIS — I4891 Unspecified atrial fibrillation: Secondary | ICD-10-CM | POA: Diagnosis not present

## 2012-08-05 DIAGNOSIS — M47817 Spondylosis without myelopathy or radiculopathy, lumbosacral region: Secondary | ICD-10-CM | POA: Diagnosis not present

## 2012-08-05 DIAGNOSIS — M999 Biomechanical lesion, unspecified: Secondary | ICD-10-CM | POA: Diagnosis not present

## 2012-08-24 DIAGNOSIS — I10 Essential (primary) hypertension: Secondary | ICD-10-CM | POA: Diagnosis not present

## 2012-08-24 DIAGNOSIS — E782 Mixed hyperlipidemia: Secondary | ICD-10-CM | POA: Diagnosis not present

## 2012-08-30 ENCOUNTER — Ambulatory Visit (INDEPENDENT_AMBULATORY_CARE_PROVIDER_SITE_OTHER): Payer: Medicare Other | Admitting: *Deleted

## 2012-08-30 DIAGNOSIS — K219 Gastro-esophageal reflux disease without esophagitis: Secondary | ICD-10-CM | POA: Diagnosis not present

## 2012-08-30 DIAGNOSIS — I10 Essential (primary) hypertension: Secondary | ICD-10-CM | POA: Diagnosis not present

## 2012-08-30 DIAGNOSIS — M1990 Unspecified osteoarthritis, unspecified site: Secondary | ICD-10-CM | POA: Diagnosis not present

## 2012-08-30 DIAGNOSIS — I4891 Unspecified atrial fibrillation: Secondary | ICD-10-CM | POA: Diagnosis not present

## 2012-08-30 DIAGNOSIS — Z7901 Long term (current) use of anticoagulants: Secondary | ICD-10-CM

## 2012-08-30 DIAGNOSIS — K7689 Other specified diseases of liver: Secondary | ICD-10-CM | POA: Diagnosis not present

## 2012-08-30 DIAGNOSIS — E782 Mixed hyperlipidemia: Secondary | ICD-10-CM | POA: Diagnosis not present

## 2012-08-30 DIAGNOSIS — B0229 Other postherpetic nervous system involvement: Secondary | ICD-10-CM | POA: Diagnosis not present

## 2012-08-30 DIAGNOSIS — I509 Heart failure, unspecified: Secondary | ICD-10-CM | POA: Diagnosis not present

## 2012-09-21 DIAGNOSIS — M9981 Other biomechanical lesions of cervical region: Secondary | ICD-10-CM | POA: Diagnosis not present

## 2012-09-21 DIAGNOSIS — M531 Cervicobrachial syndrome: Secondary | ICD-10-CM | POA: Diagnosis not present

## 2012-09-21 DIAGNOSIS — M4712 Other spondylosis with myelopathy, cervical region: Secondary | ICD-10-CM | POA: Diagnosis not present

## 2012-09-27 ENCOUNTER — Ambulatory Visit (INDEPENDENT_AMBULATORY_CARE_PROVIDER_SITE_OTHER): Payer: Medicare Other | Admitting: *Deleted

## 2012-09-27 DIAGNOSIS — I4891 Unspecified atrial fibrillation: Secondary | ICD-10-CM | POA: Diagnosis not present

## 2012-09-27 DIAGNOSIS — Z7901 Long term (current) use of anticoagulants: Secondary | ICD-10-CM

## 2012-09-27 LAB — POCT INR: INR: 3.2

## 2012-09-28 DIAGNOSIS — M9981 Other biomechanical lesions of cervical region: Secondary | ICD-10-CM | POA: Diagnosis not present

## 2012-09-28 DIAGNOSIS — M531 Cervicobrachial syndrome: Secondary | ICD-10-CM | POA: Diagnosis not present

## 2012-09-28 DIAGNOSIS — M4712 Other spondylosis with myelopathy, cervical region: Secondary | ICD-10-CM | POA: Diagnosis not present

## 2012-10-05 DIAGNOSIS — M531 Cervicobrachial syndrome: Secondary | ICD-10-CM | POA: Diagnosis not present

## 2012-10-05 DIAGNOSIS — M9981 Other biomechanical lesions of cervical region: Secondary | ICD-10-CM | POA: Diagnosis not present

## 2012-10-05 DIAGNOSIS — M4712 Other spondylosis with myelopathy, cervical region: Secondary | ICD-10-CM | POA: Diagnosis not present

## 2012-10-12 DIAGNOSIS — M531 Cervicobrachial syndrome: Secondary | ICD-10-CM | POA: Diagnosis not present

## 2012-10-12 DIAGNOSIS — M9981 Other biomechanical lesions of cervical region: Secondary | ICD-10-CM | POA: Diagnosis not present

## 2012-10-12 DIAGNOSIS — M4712 Other spondylosis with myelopathy, cervical region: Secondary | ICD-10-CM | POA: Diagnosis not present

## 2012-10-17 DIAGNOSIS — M25519 Pain in unspecified shoulder: Secondary | ICD-10-CM | POA: Diagnosis not present

## 2012-10-17 DIAGNOSIS — M1990 Unspecified osteoarthritis, unspecified site: Secondary | ICD-10-CM | POA: Diagnosis not present

## 2012-10-24 DIAGNOSIS — IMO0001 Reserved for inherently not codable concepts without codable children: Secondary | ICD-10-CM | POA: Diagnosis not present

## 2012-10-24 DIAGNOSIS — M25519 Pain in unspecified shoulder: Secondary | ICD-10-CM | POA: Diagnosis not present

## 2012-10-25 ENCOUNTER — Ambulatory Visit (INDEPENDENT_AMBULATORY_CARE_PROVIDER_SITE_OTHER): Payer: Medicare Other | Admitting: *Deleted

## 2012-10-25 DIAGNOSIS — Z7901 Long term (current) use of anticoagulants: Secondary | ICD-10-CM

## 2012-10-25 DIAGNOSIS — I4891 Unspecified atrial fibrillation: Secondary | ICD-10-CM

## 2012-10-26 DIAGNOSIS — M25519 Pain in unspecified shoulder: Secondary | ICD-10-CM | POA: Diagnosis not present

## 2012-10-26 DIAGNOSIS — IMO0001 Reserved for inherently not codable concepts without codable children: Secondary | ICD-10-CM | POA: Diagnosis not present

## 2012-10-28 DIAGNOSIS — M25519 Pain in unspecified shoulder: Secondary | ICD-10-CM | POA: Diagnosis not present

## 2012-10-28 DIAGNOSIS — M6281 Muscle weakness (generalized): Secondary | ICD-10-CM | POA: Diagnosis not present

## 2012-10-28 DIAGNOSIS — IMO0001 Reserved for inherently not codable concepts without codable children: Secondary | ICD-10-CM | POA: Diagnosis not present

## 2012-10-31 DIAGNOSIS — M25519 Pain in unspecified shoulder: Secondary | ICD-10-CM | POA: Diagnosis not present

## 2012-10-31 DIAGNOSIS — M6281 Muscle weakness (generalized): Secondary | ICD-10-CM | POA: Diagnosis not present

## 2012-10-31 DIAGNOSIS — IMO0001 Reserved for inherently not codable concepts without codable children: Secondary | ICD-10-CM | POA: Diagnosis not present

## 2012-11-02 DIAGNOSIS — IMO0001 Reserved for inherently not codable concepts without codable children: Secondary | ICD-10-CM | POA: Diagnosis not present

## 2012-11-02 DIAGNOSIS — M25519 Pain in unspecified shoulder: Secondary | ICD-10-CM | POA: Diagnosis not present

## 2012-11-02 DIAGNOSIS — M6281 Muscle weakness (generalized): Secondary | ICD-10-CM | POA: Diagnosis not present

## 2012-11-04 DIAGNOSIS — IMO0001 Reserved for inherently not codable concepts without codable children: Secondary | ICD-10-CM | POA: Diagnosis not present

## 2012-11-04 DIAGNOSIS — M25519 Pain in unspecified shoulder: Secondary | ICD-10-CM | POA: Diagnosis not present

## 2012-11-04 DIAGNOSIS — M6281 Muscle weakness (generalized): Secondary | ICD-10-CM | POA: Diagnosis not present

## 2012-11-07 DIAGNOSIS — R05 Cough: Secondary | ICD-10-CM | POA: Diagnosis not present

## 2012-11-07 DIAGNOSIS — R110 Nausea: Secondary | ICD-10-CM | POA: Diagnosis not present

## 2012-11-07 DIAGNOSIS — IMO0001 Reserved for inherently not codable concepts without codable children: Secondary | ICD-10-CM | POA: Diagnosis not present

## 2012-11-07 DIAGNOSIS — M25519 Pain in unspecified shoulder: Secondary | ICD-10-CM | POA: Diagnosis not present

## 2012-11-07 DIAGNOSIS — M6281 Muscle weakness (generalized): Secondary | ICD-10-CM | POA: Diagnosis not present

## 2012-11-07 DIAGNOSIS — J40 Bronchitis, not specified as acute or chronic: Secondary | ICD-10-CM | POA: Diagnosis not present

## 2012-11-07 DIAGNOSIS — R1084 Generalized abdominal pain: Secondary | ICD-10-CM | POA: Diagnosis not present

## 2012-11-09 DIAGNOSIS — IMO0001 Reserved for inherently not codable concepts without codable children: Secondary | ICD-10-CM | POA: Diagnosis not present

## 2012-11-09 DIAGNOSIS — M6281 Muscle weakness (generalized): Secondary | ICD-10-CM | POA: Diagnosis not present

## 2012-11-09 DIAGNOSIS — M25519 Pain in unspecified shoulder: Secondary | ICD-10-CM | POA: Diagnosis not present

## 2012-11-10 DIAGNOSIS — I4891 Unspecified atrial fibrillation: Secondary | ICD-10-CM | POA: Diagnosis not present

## 2012-11-11 ENCOUNTER — Telehealth: Payer: Self-pay | Admitting: *Deleted

## 2012-11-11 DIAGNOSIS — M6281 Muscle weakness (generalized): Secondary | ICD-10-CM | POA: Diagnosis not present

## 2012-11-11 DIAGNOSIS — IMO0001 Reserved for inherently not codable concepts without codable children: Secondary | ICD-10-CM | POA: Diagnosis not present

## 2012-11-11 DIAGNOSIS — M25519 Pain in unspecified shoulder: Secondary | ICD-10-CM | POA: Diagnosis not present

## 2012-11-11 NOTE — Telephone Encounter (Signed)
Pt was started on levaquin 3 days ago by PCP.  She checked INR in her office yesterday.  INR was 2.5.  Pt has 2 days of levaquin left.  Told pt to continue current dose of coumadin and INR appt changed to 11/25/12.  Pt verbalized understanding.

## 2012-11-17 DIAGNOSIS — R110 Nausea: Secondary | ICD-10-CM | POA: Diagnosis not present

## 2012-11-17 DIAGNOSIS — R109 Unspecified abdominal pain: Secondary | ICD-10-CM | POA: Diagnosis not present

## 2012-11-17 DIAGNOSIS — K7689 Other specified diseases of liver: Secondary | ICD-10-CM | POA: Diagnosis not present

## 2012-11-18 DIAGNOSIS — IMO0001 Reserved for inherently not codable concepts without codable children: Secondary | ICD-10-CM | POA: Diagnosis not present

## 2012-11-18 DIAGNOSIS — M6281 Muscle weakness (generalized): Secondary | ICD-10-CM | POA: Diagnosis not present

## 2012-11-18 DIAGNOSIS — M25519 Pain in unspecified shoulder: Secondary | ICD-10-CM | POA: Diagnosis not present

## 2012-11-22 DIAGNOSIS — IMO0001 Reserved for inherently not codable concepts without codable children: Secondary | ICD-10-CM | POA: Diagnosis not present

## 2012-11-22 DIAGNOSIS — M25519 Pain in unspecified shoulder: Secondary | ICD-10-CM | POA: Diagnosis not present

## 2012-11-22 DIAGNOSIS — M6281 Muscle weakness (generalized): Secondary | ICD-10-CM | POA: Diagnosis not present

## 2012-11-25 ENCOUNTER — Ambulatory Visit (INDEPENDENT_AMBULATORY_CARE_PROVIDER_SITE_OTHER): Payer: Medicare Other | Admitting: *Deleted

## 2012-11-25 DIAGNOSIS — Z7901 Long term (current) use of anticoagulants: Secondary | ICD-10-CM | POA: Diagnosis not present

## 2012-11-25 DIAGNOSIS — I4891 Unspecified atrial fibrillation: Secondary | ICD-10-CM

## 2012-11-28 DIAGNOSIS — M25519 Pain in unspecified shoulder: Secondary | ICD-10-CM | POA: Diagnosis not present

## 2012-11-28 DIAGNOSIS — IMO0001 Reserved for inherently not codable concepts without codable children: Secondary | ICD-10-CM | POA: Diagnosis not present

## 2012-11-28 DIAGNOSIS — M6281 Muscle weakness (generalized): Secondary | ICD-10-CM | POA: Diagnosis not present

## 2012-12-14 DIAGNOSIS — M25519 Pain in unspecified shoulder: Secondary | ICD-10-CM | POA: Diagnosis not present

## 2012-12-14 DIAGNOSIS — M67919 Unspecified disorder of synovium and tendon, unspecified shoulder: Secondary | ICD-10-CM | POA: Diagnosis not present

## 2012-12-14 DIAGNOSIS — M719 Bursopathy, unspecified: Secondary | ICD-10-CM | POA: Diagnosis not present

## 2012-12-14 DIAGNOSIS — M549 Dorsalgia, unspecified: Secondary | ICD-10-CM | POA: Diagnosis not present

## 2012-12-16 DIAGNOSIS — S46819A Strain of other muscles, fascia and tendons at shoulder and upper arm level, unspecified arm, initial encounter: Secondary | ICD-10-CM | POA: Diagnosis not present

## 2012-12-16 DIAGNOSIS — M19019 Primary osteoarthritis, unspecified shoulder: Secondary | ICD-10-CM | POA: Diagnosis not present

## 2012-12-16 DIAGNOSIS — M25519 Pain in unspecified shoulder: Secondary | ICD-10-CM | POA: Diagnosis not present

## 2012-12-16 DIAGNOSIS — M67919 Unspecified disorder of synovium and tendon, unspecified shoulder: Secondary | ICD-10-CM | POA: Diagnosis not present

## 2012-12-22 DIAGNOSIS — M19019 Primary osteoarthritis, unspecified shoulder: Secondary | ICD-10-CM | POA: Diagnosis not present

## 2012-12-22 DIAGNOSIS — M67919 Unspecified disorder of synovium and tendon, unspecified shoulder: Secondary | ICD-10-CM | POA: Diagnosis not present

## 2012-12-22 DIAGNOSIS — M25519 Pain in unspecified shoulder: Secondary | ICD-10-CM | POA: Diagnosis not present

## 2012-12-22 DIAGNOSIS — M549 Dorsalgia, unspecified: Secondary | ICD-10-CM | POA: Diagnosis not present

## 2012-12-23 ENCOUNTER — Ambulatory Visit (INDEPENDENT_AMBULATORY_CARE_PROVIDER_SITE_OTHER): Payer: Medicare Other | Admitting: *Deleted

## 2012-12-23 DIAGNOSIS — I4891 Unspecified atrial fibrillation: Secondary | ICD-10-CM | POA: Diagnosis not present

## 2012-12-23 DIAGNOSIS — Z7901 Long term (current) use of anticoagulants: Secondary | ICD-10-CM | POA: Diagnosis not present

## 2012-12-23 LAB — POCT INR: INR: 2.5

## 2012-12-29 DIAGNOSIS — M531 Cervicobrachial syndrome: Secondary | ICD-10-CM | POA: Diagnosis not present

## 2012-12-29 DIAGNOSIS — M4712 Other spondylosis with myelopathy, cervical region: Secondary | ICD-10-CM | POA: Diagnosis not present

## 2012-12-29 DIAGNOSIS — M9981 Other biomechanical lesions of cervical region: Secondary | ICD-10-CM | POA: Diagnosis not present

## 2013-01-02 DIAGNOSIS — M531 Cervicobrachial syndrome: Secondary | ICD-10-CM | POA: Diagnosis not present

## 2013-01-02 DIAGNOSIS — M4712 Other spondylosis with myelopathy, cervical region: Secondary | ICD-10-CM | POA: Diagnosis not present

## 2013-01-02 DIAGNOSIS — M9981 Other biomechanical lesions of cervical region: Secondary | ICD-10-CM | POA: Diagnosis not present

## 2013-01-05 DIAGNOSIS — I10 Essential (primary) hypertension: Secondary | ICD-10-CM | POA: Diagnosis not present

## 2013-01-05 DIAGNOSIS — K7689 Other specified diseases of liver: Secondary | ICD-10-CM | POA: Diagnosis not present

## 2013-01-05 DIAGNOSIS — M4712 Other spondylosis with myelopathy, cervical region: Secondary | ICD-10-CM | POA: Diagnosis not present

## 2013-01-05 DIAGNOSIS — M9981 Other biomechanical lesions of cervical region: Secondary | ICD-10-CM | POA: Diagnosis not present

## 2013-01-05 DIAGNOSIS — M531 Cervicobrachial syndrome: Secondary | ICD-10-CM | POA: Diagnosis not present

## 2013-01-05 DIAGNOSIS — E782 Mixed hyperlipidemia: Secondary | ICD-10-CM | POA: Diagnosis not present

## 2013-01-09 DIAGNOSIS — M531 Cervicobrachial syndrome: Secondary | ICD-10-CM | POA: Diagnosis not present

## 2013-01-09 DIAGNOSIS — M4712 Other spondylosis with myelopathy, cervical region: Secondary | ICD-10-CM | POA: Diagnosis not present

## 2013-01-09 DIAGNOSIS — M9981 Other biomechanical lesions of cervical region: Secondary | ICD-10-CM | POA: Diagnosis not present

## 2013-01-12 DIAGNOSIS — E782 Mixed hyperlipidemia: Secondary | ICD-10-CM | POA: Diagnosis not present

## 2013-01-12 DIAGNOSIS — I10 Essential (primary) hypertension: Secondary | ICD-10-CM | POA: Diagnosis not present

## 2013-01-12 DIAGNOSIS — M1990 Unspecified osteoarthritis, unspecified site: Secondary | ICD-10-CM | POA: Diagnosis not present

## 2013-01-12 DIAGNOSIS — M4712 Other spondylosis with myelopathy, cervical region: Secondary | ICD-10-CM | POA: Diagnosis not present

## 2013-01-12 DIAGNOSIS — K219 Gastro-esophageal reflux disease without esophagitis: Secondary | ICD-10-CM | POA: Diagnosis not present

## 2013-01-12 DIAGNOSIS — M9981 Other biomechanical lesions of cervical region: Secondary | ICD-10-CM | POA: Diagnosis not present

## 2013-01-12 DIAGNOSIS — I509 Heart failure, unspecified: Secondary | ICD-10-CM | POA: Diagnosis not present

## 2013-01-12 DIAGNOSIS — B0229 Other postherpetic nervous system involvement: Secondary | ICD-10-CM | POA: Diagnosis not present

## 2013-01-12 DIAGNOSIS — M531 Cervicobrachial syndrome: Secondary | ICD-10-CM | POA: Diagnosis not present

## 2013-01-12 DIAGNOSIS — I4891 Unspecified atrial fibrillation: Secondary | ICD-10-CM | POA: Diagnosis not present

## 2013-01-12 DIAGNOSIS — K7689 Other specified diseases of liver: Secondary | ICD-10-CM | POA: Diagnosis not present

## 2013-01-19 DIAGNOSIS — M531 Cervicobrachial syndrome: Secondary | ICD-10-CM | POA: Diagnosis not present

## 2013-01-19 DIAGNOSIS — M9981 Other biomechanical lesions of cervical region: Secondary | ICD-10-CM | POA: Diagnosis not present

## 2013-01-19 DIAGNOSIS — R7989 Other specified abnormal findings of blood chemistry: Secondary | ICD-10-CM | POA: Diagnosis not present

## 2013-01-19 DIAGNOSIS — I10 Essential (primary) hypertension: Secondary | ICD-10-CM | POA: Diagnosis not present

## 2013-01-19 DIAGNOSIS — M4712 Other spondylosis with myelopathy, cervical region: Secondary | ICD-10-CM | POA: Diagnosis not present

## 2013-01-19 DIAGNOSIS — E782 Mixed hyperlipidemia: Secondary | ICD-10-CM | POA: Diagnosis not present

## 2013-01-20 ENCOUNTER — Ambulatory Visit (INDEPENDENT_AMBULATORY_CARE_PROVIDER_SITE_OTHER): Payer: Medicare Other | Admitting: *Deleted

## 2013-01-20 DIAGNOSIS — Z7901 Long term (current) use of anticoagulants: Secondary | ICD-10-CM

## 2013-01-20 DIAGNOSIS — I4891 Unspecified atrial fibrillation: Secondary | ICD-10-CM

## 2013-01-20 LAB — POCT INR: INR: 1.8

## 2013-01-23 DIAGNOSIS — M9981 Other biomechanical lesions of cervical region: Secondary | ICD-10-CM | POA: Diagnosis not present

## 2013-01-23 DIAGNOSIS — M4712 Other spondylosis with myelopathy, cervical region: Secondary | ICD-10-CM | POA: Diagnosis not present

## 2013-01-23 DIAGNOSIS — M531 Cervicobrachial syndrome: Secondary | ICD-10-CM | POA: Diagnosis not present

## 2013-01-26 DIAGNOSIS — I10 Essential (primary) hypertension: Secondary | ICD-10-CM | POA: Diagnosis not present

## 2013-01-26 DIAGNOSIS — I4891 Unspecified atrial fibrillation: Secondary | ICD-10-CM | POA: Diagnosis not present

## 2013-01-26 DIAGNOSIS — M531 Cervicobrachial syndrome: Secondary | ICD-10-CM | POA: Diagnosis not present

## 2013-01-26 DIAGNOSIS — M9981 Other biomechanical lesions of cervical region: Secondary | ICD-10-CM | POA: Diagnosis not present

## 2013-01-26 DIAGNOSIS — E782 Mixed hyperlipidemia: Secondary | ICD-10-CM | POA: Diagnosis not present

## 2013-01-26 DIAGNOSIS — M4712 Other spondylosis with myelopathy, cervical region: Secondary | ICD-10-CM | POA: Diagnosis not present

## 2013-01-30 DIAGNOSIS — M5030 Other cervical disc degeneration, unspecified cervical region: Secondary | ICD-10-CM | POA: Diagnosis not present

## 2013-01-30 DIAGNOSIS — S43429A Sprain of unspecified rotator cuff capsule, initial encounter: Secondary | ICD-10-CM | POA: Diagnosis not present

## 2013-01-30 DIAGNOSIS — IMO0001 Reserved for inherently not codable concepts without codable children: Secondary | ICD-10-CM | POA: Diagnosis not present

## 2013-01-30 DIAGNOSIS — IMO0002 Reserved for concepts with insufficient information to code with codable children: Secondary | ICD-10-CM | POA: Diagnosis not present

## 2013-01-30 DIAGNOSIS — M47812 Spondylosis without myelopathy or radiculopathy, cervical region: Secondary | ICD-10-CM | POA: Diagnosis not present

## 2013-01-30 DIAGNOSIS — M9981 Other biomechanical lesions of cervical region: Secondary | ICD-10-CM | POA: Diagnosis not present

## 2013-01-30 DIAGNOSIS — M47814 Spondylosis without myelopathy or radiculopathy, thoracic region: Secondary | ICD-10-CM | POA: Diagnosis not present

## 2013-01-30 DIAGNOSIS — M531 Cervicobrachial syndrome: Secondary | ICD-10-CM | POA: Diagnosis not present

## 2013-01-30 DIAGNOSIS — M4712 Other spondylosis with myelopathy, cervical region: Secondary | ICD-10-CM | POA: Diagnosis not present

## 2013-02-02 DIAGNOSIS — M531 Cervicobrachial syndrome: Secondary | ICD-10-CM | POA: Diagnosis not present

## 2013-02-02 DIAGNOSIS — M549 Dorsalgia, unspecified: Secondary | ICD-10-CM | POA: Diagnosis not present

## 2013-02-02 DIAGNOSIS — M67919 Unspecified disorder of synovium and tendon, unspecified shoulder: Secondary | ICD-10-CM | POA: Diagnosis not present

## 2013-02-02 DIAGNOSIS — M4712 Other spondylosis with myelopathy, cervical region: Secondary | ICD-10-CM | POA: Diagnosis not present

## 2013-02-02 DIAGNOSIS — S43429A Sprain of unspecified rotator cuff capsule, initial encounter: Secondary | ICD-10-CM | POA: Diagnosis not present

## 2013-02-02 DIAGNOSIS — M719 Bursopathy, unspecified: Secondary | ICD-10-CM | POA: Diagnosis not present

## 2013-02-02 DIAGNOSIS — M19019 Primary osteoarthritis, unspecified shoulder: Secondary | ICD-10-CM | POA: Diagnosis not present

## 2013-02-02 DIAGNOSIS — M9981 Other biomechanical lesions of cervical region: Secondary | ICD-10-CM | POA: Diagnosis not present

## 2013-02-03 ENCOUNTER — Ambulatory Visit (INDEPENDENT_AMBULATORY_CARE_PROVIDER_SITE_OTHER): Payer: Medicare Other | Admitting: *Deleted

## 2013-02-03 DIAGNOSIS — Z7901 Long term (current) use of anticoagulants: Secondary | ICD-10-CM

## 2013-02-03 DIAGNOSIS — I4891 Unspecified atrial fibrillation: Secondary | ICD-10-CM

## 2013-02-03 LAB — POCT INR: INR: 2.6

## 2013-02-06 DIAGNOSIS — M9981 Other biomechanical lesions of cervical region: Secondary | ICD-10-CM | POA: Diagnosis not present

## 2013-02-06 DIAGNOSIS — M4712 Other spondylosis with myelopathy, cervical region: Secondary | ICD-10-CM | POA: Diagnosis not present

## 2013-02-06 DIAGNOSIS — M531 Cervicobrachial syndrome: Secondary | ICD-10-CM | POA: Diagnosis not present

## 2013-02-07 ENCOUNTER — Telehealth: Payer: Self-pay | Admitting: *Deleted

## 2013-02-07 NOTE — Telephone Encounter (Signed)
Pt is scheduled to have back injections on 02/13/13 by Dr Weldon Inches.  She stopped her coumadin on 8/11 and will restart coumadin night of procedure.  She will take coumadin 1 tablet (4mg ) until INR check on 02/21/13.

## 2013-02-13 DIAGNOSIS — M47812 Spondylosis without myelopathy or radiculopathy, cervical region: Secondary | ICD-10-CM | POA: Diagnosis not present

## 2013-02-13 DIAGNOSIS — IMO0001 Reserved for inherently not codable concepts without codable children: Secondary | ICD-10-CM | POA: Diagnosis not present

## 2013-02-13 DIAGNOSIS — M67919 Unspecified disorder of synovium and tendon, unspecified shoulder: Secondary | ICD-10-CM | POA: Diagnosis not present

## 2013-02-13 DIAGNOSIS — M5030 Other cervical disc degeneration, unspecified cervical region: Secondary | ICD-10-CM | POA: Diagnosis not present

## 2013-02-15 ENCOUNTER — Ambulatory Visit: Payer: Medicare Other | Admitting: Cardiology

## 2013-02-16 DIAGNOSIS — M4712 Other spondylosis with myelopathy, cervical region: Secondary | ICD-10-CM | POA: Diagnosis not present

## 2013-02-16 DIAGNOSIS — M531 Cervicobrachial syndrome: Secondary | ICD-10-CM | POA: Diagnosis not present

## 2013-02-16 DIAGNOSIS — M9981 Other biomechanical lesions of cervical region: Secondary | ICD-10-CM | POA: Diagnosis not present

## 2013-02-20 DIAGNOSIS — M9981 Other biomechanical lesions of cervical region: Secondary | ICD-10-CM | POA: Diagnosis not present

## 2013-02-20 DIAGNOSIS — M531 Cervicobrachial syndrome: Secondary | ICD-10-CM | POA: Diagnosis not present

## 2013-02-20 DIAGNOSIS — M4712 Other spondylosis with myelopathy, cervical region: Secondary | ICD-10-CM | POA: Diagnosis not present

## 2013-02-21 ENCOUNTER — Ambulatory Visit (INDEPENDENT_AMBULATORY_CARE_PROVIDER_SITE_OTHER): Payer: Medicare Other | Admitting: *Deleted

## 2013-02-21 DIAGNOSIS — Z7901 Long term (current) use of anticoagulants: Secondary | ICD-10-CM | POA: Diagnosis not present

## 2013-02-21 DIAGNOSIS — I4891 Unspecified atrial fibrillation: Secondary | ICD-10-CM | POA: Diagnosis not present

## 2013-02-22 DIAGNOSIS — I10 Essential (primary) hypertension: Secondary | ICD-10-CM | POA: Diagnosis not present

## 2013-02-22 DIAGNOSIS — E782 Mixed hyperlipidemia: Secondary | ICD-10-CM | POA: Diagnosis not present

## 2013-02-22 DIAGNOSIS — M67919 Unspecified disorder of synovium and tendon, unspecified shoulder: Secondary | ICD-10-CM | POA: Diagnosis not present

## 2013-02-22 DIAGNOSIS — I4891 Unspecified atrial fibrillation: Secondary | ICD-10-CM | POA: Diagnosis not present

## 2013-02-22 DIAGNOSIS — M1990 Unspecified osteoarthritis, unspecified site: Secondary | ICD-10-CM | POA: Diagnosis not present

## 2013-02-22 DIAGNOSIS — K219 Gastro-esophageal reflux disease without esophagitis: Secondary | ICD-10-CM | POA: Diagnosis not present

## 2013-02-22 DIAGNOSIS — I509 Heart failure, unspecified: Secondary | ICD-10-CM | POA: Diagnosis not present

## 2013-02-22 DIAGNOSIS — B0229 Other postherpetic nervous system involvement: Secondary | ICD-10-CM | POA: Diagnosis not present

## 2013-02-23 DIAGNOSIS — M9981 Other biomechanical lesions of cervical region: Secondary | ICD-10-CM | POA: Diagnosis not present

## 2013-02-23 DIAGNOSIS — M4712 Other spondylosis with myelopathy, cervical region: Secondary | ICD-10-CM | POA: Diagnosis not present

## 2013-02-23 DIAGNOSIS — M531 Cervicobrachial syndrome: Secondary | ICD-10-CM | POA: Diagnosis not present

## 2013-02-28 DIAGNOSIS — M4712 Other spondylosis with myelopathy, cervical region: Secondary | ICD-10-CM | POA: Diagnosis not present

## 2013-02-28 DIAGNOSIS — M531 Cervicobrachial syndrome: Secondary | ICD-10-CM | POA: Diagnosis not present

## 2013-02-28 DIAGNOSIS — M9981 Other biomechanical lesions of cervical region: Secondary | ICD-10-CM | POA: Diagnosis not present

## 2013-03-02 DIAGNOSIS — M9981 Other biomechanical lesions of cervical region: Secondary | ICD-10-CM | POA: Diagnosis not present

## 2013-03-02 DIAGNOSIS — M531 Cervicobrachial syndrome: Secondary | ICD-10-CM | POA: Diagnosis not present

## 2013-03-02 DIAGNOSIS — M4712 Other spondylosis with myelopathy, cervical region: Secondary | ICD-10-CM | POA: Diagnosis not present

## 2013-03-06 DIAGNOSIS — M4712 Other spondylosis with myelopathy, cervical region: Secondary | ICD-10-CM | POA: Diagnosis not present

## 2013-03-06 DIAGNOSIS — M531 Cervicobrachial syndrome: Secondary | ICD-10-CM | POA: Diagnosis not present

## 2013-03-06 DIAGNOSIS — M9981 Other biomechanical lesions of cervical region: Secondary | ICD-10-CM | POA: Diagnosis not present

## 2013-03-09 DIAGNOSIS — M9981 Other biomechanical lesions of cervical region: Secondary | ICD-10-CM | POA: Diagnosis not present

## 2013-03-09 DIAGNOSIS — M531 Cervicobrachial syndrome: Secondary | ICD-10-CM | POA: Diagnosis not present

## 2013-03-09 DIAGNOSIS — M4712 Other spondylosis with myelopathy, cervical region: Secondary | ICD-10-CM | POA: Diagnosis not present

## 2013-03-13 DIAGNOSIS — M531 Cervicobrachial syndrome: Secondary | ICD-10-CM | POA: Diagnosis not present

## 2013-03-13 DIAGNOSIS — M9981 Other biomechanical lesions of cervical region: Secondary | ICD-10-CM | POA: Diagnosis not present

## 2013-03-13 DIAGNOSIS — M4712 Other spondylosis with myelopathy, cervical region: Secondary | ICD-10-CM | POA: Diagnosis not present

## 2013-03-16 DIAGNOSIS — M4712 Other spondylosis with myelopathy, cervical region: Secondary | ICD-10-CM | POA: Diagnosis not present

## 2013-03-16 DIAGNOSIS — M9981 Other biomechanical lesions of cervical region: Secondary | ICD-10-CM | POA: Diagnosis not present

## 2013-03-16 DIAGNOSIS — M531 Cervicobrachial syndrome: Secondary | ICD-10-CM | POA: Diagnosis not present

## 2013-03-17 ENCOUNTER — Ambulatory Visit (INDEPENDENT_AMBULATORY_CARE_PROVIDER_SITE_OTHER): Payer: Medicare Other | Admitting: *Deleted

## 2013-03-17 DIAGNOSIS — I4891 Unspecified atrial fibrillation: Secondary | ICD-10-CM | POA: Diagnosis not present

## 2013-03-17 DIAGNOSIS — Z7901 Long term (current) use of anticoagulants: Secondary | ICD-10-CM

## 2013-03-20 DIAGNOSIS — M4712 Other spondylosis with myelopathy, cervical region: Secondary | ICD-10-CM | POA: Diagnosis not present

## 2013-03-20 DIAGNOSIS — M531 Cervicobrachial syndrome: Secondary | ICD-10-CM | POA: Diagnosis not present

## 2013-03-20 DIAGNOSIS — M9981 Other biomechanical lesions of cervical region: Secondary | ICD-10-CM | POA: Diagnosis not present

## 2013-03-21 DIAGNOSIS — Z23 Encounter for immunization: Secondary | ICD-10-CM | POA: Diagnosis not present

## 2013-03-23 DIAGNOSIS — M9981 Other biomechanical lesions of cervical region: Secondary | ICD-10-CM | POA: Diagnosis not present

## 2013-03-23 DIAGNOSIS — M531 Cervicobrachial syndrome: Secondary | ICD-10-CM | POA: Diagnosis not present

## 2013-03-23 DIAGNOSIS — M4712 Other spondylosis with myelopathy, cervical region: Secondary | ICD-10-CM | POA: Diagnosis not present

## 2013-03-30 ENCOUNTER — Ambulatory Visit: Payer: Medicare Other | Admitting: Cardiology

## 2013-03-30 ENCOUNTER — Encounter: Payer: Self-pay | Admitting: Cardiology

## 2013-03-30 ENCOUNTER — Ambulatory Visit (INDEPENDENT_AMBULATORY_CARE_PROVIDER_SITE_OTHER): Payer: Medicare Other | Admitting: Cardiology

## 2013-03-30 VITALS — BP 139/71 | HR 84 | Ht 67.0 in | Wt 203.0 lb

## 2013-03-30 DIAGNOSIS — I4891 Unspecified atrial fibrillation: Secondary | ICD-10-CM | POA: Diagnosis not present

## 2013-03-30 DIAGNOSIS — R0789 Other chest pain: Secondary | ICD-10-CM | POA: Diagnosis not present

## 2013-03-30 DIAGNOSIS — Z7901 Long term (current) use of anticoagulants: Secondary | ICD-10-CM

## 2013-03-30 NOTE — Assessment & Plan Note (Signed)
Atrial fibrillation rate is controlled. No further workup. Plan followup 1 year.

## 2013-03-30 NOTE — Assessment & Plan Note (Signed)
She has not been having any significant chest pain. No further workup.

## 2013-03-30 NOTE — Progress Notes (Signed)
HPI  Patient is seen back to evaluate atrial fibrillation. She has chronic atrial fib and she is on Coumadin. There is an old right bundle branch block. Her atrial fibrillation is controlled. She's had some back and shoulder problems. She continues to function and she is stable.  Allergies  Allergen Reactions  . Penicillins     REACTION: rash    Current Outpatient Prescriptions  Medication Sig Dispense Refill  . acetaminophen (TYLENOL) 650 MG CR tablet Take 650 mg by mouth every 8 (eight) hours as needed.        . B Complex Vitamins (B COMPLEX-B12) TABS Take by mouth.      . Cholecalciferol (VITAMIN D3) 1000 UNITS tablet Take 1,000 Units by mouth daily.        Marland Kitchen diltiazem (CARDIZEM CD) 240 MG 24 hr capsule Take 240 mg by mouth daily.       . furosemide (LASIX) 40 MG tablet Take 1 tablet by mouth Twice daily.      . hydrochlorothiazide 25 MG tablet Take 25 mg by mouth daily.        Marland Kitchen lovastatin (MEVACOR) 20 MG tablet Take 20 mg by mouth daily.        . Multiple Vitamin (MULTIVITAMIN) tablet Take 1 tablet by mouth daily.        . traMADol (ULTRAM) 50 MG tablet Take 50 mg by mouth every 6 (six) hours as needed for pain.      Marland Kitchen warfarin (COUMADIN) 4 MG tablet Take by mouth as directed.        No current facility-administered medications for this visit.    History   Social History  . Marital Status: Divorced    Spouse Name: N/A    Number of Children: N/A  . Years of Education: N/A   Occupational History  . Not on file.   Social History Main Topics  . Smoking status: Former Smoker -- 2.00 packs/day for 24 years    Types: Cigarettes    Quit date: 06/29/1968  . Smokeless tobacco: Never Used  . Alcohol Use: Not on file  . Drug Use: Not on file  . Sexual Activity: Not on file   Other Topics Concern  . Not on file   Social History Narrative   Divorced, retired.     No family history on file.  Past Medical History  Diagnosis Date  . Dyslipidemia     mixed  . Edema       Chronic lower extremity edema  . MR (mitral regurgitation)     mild; echo 6/07 (echo also showed nml LV function)  . A-fib   . IVCD (intraventricular conduction defect)   . Abnormal CT scan, chest     2005-2007; stable liver mass; also hypodense liver lesions   . Ejection fraction     EF normal, echo, 2007  . Warfarin anticoagulation   . Chest discomfort     Nuclear, 2007, no ischemia, possible attenuation artifact  . Back pain     To see Dr. Channing Mutters in September, 2013    Past Surgical History  Procedure Laterality Date  . Nuclear test  6/07    no ischemia, possible attenuation artifact     Patient Active Problem List   Diagnosis Date Noted  . Back pain   . Dyslipidemia   . Edema   . MR (mitral regurgitation)   . A-fib   . IVCD (intraventricular conduction defect)   . Abnormal CT scan, chest   .  Ejection fraction   . Warfarin anticoagulation   . Chest discomfort     ROS   Patient denies fever, chills, headache, sweats, rash, change in vision, change in hearing, chest pain, cough, nausea vomiting, urinary symptoms. All other systems are reviewed and are negative.  PHYSICAL EXAM   Patient is oriented to person time and place. Affect is normal. There is no jugulovenous distention. Lungs are clear. Respiratory effort is nonlabored. Cardiac exam reveals S1 and S2. There no clicks or significant murmurs. The abdomen is soft. The rhythm is irregularly irregular. The rate is controlled. There is no peripheral edema.  Filed Vitals:   03/30/13 1425  BP: 139/71  Pulse: 84  Height: 5\' 7"  (1.702 m)  Weight: 203 lb (92.08 kg)   EKG is done today and reviewed by me. There is old right bundle branch block. There is old atrial fibrillation. The rate is controlled.  ASSESSMENT & PLAN

## 2013-03-30 NOTE — Assessment & Plan Note (Signed)
Coumadin will be continued for her atrial fib.

## 2013-03-30 NOTE — Patient Instructions (Addendum)

## 2013-04-10 DIAGNOSIS — M4712 Other spondylosis with myelopathy, cervical region: Secondary | ICD-10-CM | POA: Diagnosis not present

## 2013-04-10 DIAGNOSIS — M9981 Other biomechanical lesions of cervical region: Secondary | ICD-10-CM | POA: Diagnosis not present

## 2013-04-10 DIAGNOSIS — M531 Cervicobrachial syndrome: Secondary | ICD-10-CM | POA: Diagnosis not present

## 2013-04-14 ENCOUNTER — Ambulatory Visit (INDEPENDENT_AMBULATORY_CARE_PROVIDER_SITE_OTHER): Payer: Medicare Other | Admitting: *Deleted

## 2013-04-14 DIAGNOSIS — Z7901 Long term (current) use of anticoagulants: Secondary | ICD-10-CM

## 2013-04-14 DIAGNOSIS — I4891 Unspecified atrial fibrillation: Secondary | ICD-10-CM

## 2013-04-14 LAB — POCT INR: INR: 2.3

## 2013-04-17 DIAGNOSIS — M4712 Other spondylosis with myelopathy, cervical region: Secondary | ICD-10-CM | POA: Diagnosis not present

## 2013-04-17 DIAGNOSIS — M531 Cervicobrachial syndrome: Secondary | ICD-10-CM | POA: Diagnosis not present

## 2013-04-17 DIAGNOSIS — M9981 Other biomechanical lesions of cervical region: Secondary | ICD-10-CM | POA: Diagnosis not present

## 2013-04-19 DIAGNOSIS — M5030 Other cervical disc degeneration, unspecified cervical region: Secondary | ICD-10-CM | POA: Diagnosis not present

## 2013-04-19 DIAGNOSIS — IMO0001 Reserved for inherently not codable concepts without codable children: Secondary | ICD-10-CM | POA: Diagnosis not present

## 2013-04-19 DIAGNOSIS — M47812 Spondylosis without myelopathy or radiculopathy, cervical region: Secondary | ICD-10-CM | POA: Diagnosis not present

## 2013-04-19 DIAGNOSIS — M67919 Unspecified disorder of synovium and tendon, unspecified shoulder: Secondary | ICD-10-CM | POA: Diagnosis not present

## 2013-04-24 DIAGNOSIS — M531 Cervicobrachial syndrome: Secondary | ICD-10-CM | POA: Diagnosis not present

## 2013-04-24 DIAGNOSIS — M9981 Other biomechanical lesions of cervical region: Secondary | ICD-10-CM | POA: Diagnosis not present

## 2013-04-24 DIAGNOSIS — M4712 Other spondylosis with myelopathy, cervical region: Secondary | ICD-10-CM | POA: Diagnosis not present

## 2013-05-01 DIAGNOSIS — M4712 Other spondylosis with myelopathy, cervical region: Secondary | ICD-10-CM | POA: Diagnosis not present

## 2013-05-01 DIAGNOSIS — M531 Cervicobrachial syndrome: Secondary | ICD-10-CM | POA: Diagnosis not present

## 2013-05-01 DIAGNOSIS — M9981 Other biomechanical lesions of cervical region: Secondary | ICD-10-CM | POA: Diagnosis not present

## 2013-05-11 DIAGNOSIS — H52229 Regular astigmatism, unspecified eye: Secondary | ICD-10-CM | POA: Diagnosis not present

## 2013-05-11 DIAGNOSIS — Z961 Presence of intraocular lens: Secondary | ICD-10-CM | POA: Diagnosis not present

## 2013-05-11 DIAGNOSIS — H35369 Drusen (degenerative) of macula, unspecified eye: Secondary | ICD-10-CM | POA: Diagnosis not present

## 2013-05-11 DIAGNOSIS — H5200 Hypermetropia, unspecified eye: Secondary | ICD-10-CM | POA: Diagnosis not present

## 2013-05-15 DIAGNOSIS — M9981 Other biomechanical lesions of cervical region: Secondary | ICD-10-CM | POA: Diagnosis not present

## 2013-05-15 DIAGNOSIS — M531 Cervicobrachial syndrome: Secondary | ICD-10-CM | POA: Diagnosis not present

## 2013-05-15 DIAGNOSIS — M4712 Other spondylosis with myelopathy, cervical region: Secondary | ICD-10-CM | POA: Diagnosis not present

## 2013-05-19 ENCOUNTER — Ambulatory Visit (INDEPENDENT_AMBULATORY_CARE_PROVIDER_SITE_OTHER): Payer: Medicare Other | Admitting: *Deleted

## 2013-05-19 DIAGNOSIS — Z7901 Long term (current) use of anticoagulants: Secondary | ICD-10-CM | POA: Diagnosis not present

## 2013-05-19 DIAGNOSIS — I4891 Unspecified atrial fibrillation: Secondary | ICD-10-CM | POA: Diagnosis not present

## 2013-05-19 LAB — POCT INR: INR: 2.4

## 2013-05-22 DIAGNOSIS — M531 Cervicobrachial syndrome: Secondary | ICD-10-CM | POA: Diagnosis not present

## 2013-05-22 DIAGNOSIS — M9981 Other biomechanical lesions of cervical region: Secondary | ICD-10-CM | POA: Diagnosis not present

## 2013-05-22 DIAGNOSIS — M4712 Other spondylosis with myelopathy, cervical region: Secondary | ICD-10-CM | POA: Diagnosis not present

## 2013-05-23 DIAGNOSIS — B0229 Other postherpetic nervous system involvement: Secondary | ICD-10-CM | POA: Diagnosis not present

## 2013-05-23 DIAGNOSIS — I509 Heart failure, unspecified: Secondary | ICD-10-CM | POA: Diagnosis not present

## 2013-05-23 DIAGNOSIS — E782 Mixed hyperlipidemia: Secondary | ICD-10-CM | POA: Diagnosis not present

## 2013-05-23 DIAGNOSIS — K219 Gastro-esophageal reflux disease without esophagitis: Secondary | ICD-10-CM | POA: Diagnosis not present

## 2013-05-23 DIAGNOSIS — Z1331 Encounter for screening for depression: Secondary | ICD-10-CM | POA: Diagnosis not present

## 2013-05-23 DIAGNOSIS — I10 Essential (primary) hypertension: Secondary | ICD-10-CM | POA: Diagnosis not present

## 2013-05-23 DIAGNOSIS — I4891 Unspecified atrial fibrillation: Secondary | ICD-10-CM | POA: Diagnosis not present

## 2013-05-23 DIAGNOSIS — M67919 Unspecified disorder of synovium and tendon, unspecified shoulder: Secondary | ICD-10-CM | POA: Diagnosis not present

## 2013-05-24 DIAGNOSIS — Z1231 Encounter for screening mammogram for malignant neoplasm of breast: Secondary | ICD-10-CM | POA: Diagnosis not present

## 2013-05-29 DIAGNOSIS — M4712 Other spondylosis with myelopathy, cervical region: Secondary | ICD-10-CM | POA: Diagnosis not present

## 2013-05-29 DIAGNOSIS — M531 Cervicobrachial syndrome: Secondary | ICD-10-CM | POA: Diagnosis not present

## 2013-05-29 DIAGNOSIS — M9981 Other biomechanical lesions of cervical region: Secondary | ICD-10-CM | POA: Diagnosis not present

## 2013-06-05 DIAGNOSIS — M4712 Other spondylosis with myelopathy, cervical region: Secondary | ICD-10-CM | POA: Diagnosis not present

## 2013-06-05 DIAGNOSIS — M531 Cervicobrachial syndrome: Secondary | ICD-10-CM | POA: Diagnosis not present

## 2013-06-05 DIAGNOSIS — M9981 Other biomechanical lesions of cervical region: Secondary | ICD-10-CM | POA: Diagnosis not present

## 2013-06-12 DIAGNOSIS — M9981 Other biomechanical lesions of cervical region: Secondary | ICD-10-CM | POA: Diagnosis not present

## 2013-06-12 DIAGNOSIS — M4712 Other spondylosis with myelopathy, cervical region: Secondary | ICD-10-CM | POA: Diagnosis not present

## 2013-06-12 DIAGNOSIS — M531 Cervicobrachial syndrome: Secondary | ICD-10-CM | POA: Diagnosis not present

## 2013-06-26 DIAGNOSIS — M9981 Other biomechanical lesions of cervical region: Secondary | ICD-10-CM | POA: Diagnosis not present

## 2013-06-26 DIAGNOSIS — M4712 Other spondylosis with myelopathy, cervical region: Secondary | ICD-10-CM | POA: Diagnosis not present

## 2013-06-26 DIAGNOSIS — M531 Cervicobrachial syndrome: Secondary | ICD-10-CM | POA: Diagnosis not present

## 2013-06-30 ENCOUNTER — Ambulatory Visit (INDEPENDENT_AMBULATORY_CARE_PROVIDER_SITE_OTHER): Payer: Medicare Other | Admitting: *Deleted

## 2013-06-30 DIAGNOSIS — I4891 Unspecified atrial fibrillation: Secondary | ICD-10-CM | POA: Diagnosis not present

## 2013-06-30 DIAGNOSIS — Z7901 Long term (current) use of anticoagulants: Secondary | ICD-10-CM | POA: Diagnosis not present

## 2013-06-30 LAB — POCT INR: INR: 2.2

## 2013-07-03 DIAGNOSIS — M9981 Other biomechanical lesions of cervical region: Secondary | ICD-10-CM | POA: Diagnosis not present

## 2013-07-03 DIAGNOSIS — M531 Cervicobrachial syndrome: Secondary | ICD-10-CM | POA: Diagnosis not present

## 2013-07-03 DIAGNOSIS — M4712 Other spondylosis with myelopathy, cervical region: Secondary | ICD-10-CM | POA: Diagnosis not present

## 2013-07-17 DIAGNOSIS — M4712 Other spondylosis with myelopathy, cervical region: Secondary | ICD-10-CM | POA: Diagnosis not present

## 2013-07-17 DIAGNOSIS — M531 Cervicobrachial syndrome: Secondary | ICD-10-CM | POA: Diagnosis not present

## 2013-07-17 DIAGNOSIS — M9981 Other biomechanical lesions of cervical region: Secondary | ICD-10-CM | POA: Diagnosis not present

## 2013-07-31 DIAGNOSIS — M531 Cervicobrachial syndrome: Secondary | ICD-10-CM | POA: Diagnosis not present

## 2013-07-31 DIAGNOSIS — M4712 Other spondylosis with myelopathy, cervical region: Secondary | ICD-10-CM | POA: Diagnosis not present

## 2013-07-31 DIAGNOSIS — M9981 Other biomechanical lesions of cervical region: Secondary | ICD-10-CM | POA: Diagnosis not present

## 2013-08-08 DIAGNOSIS — H15119 Episcleritis periodica fugax, unspecified eye: Secondary | ICD-10-CM | POA: Diagnosis not present

## 2013-08-08 DIAGNOSIS — H11429 Conjunctival edema, unspecified eye: Secondary | ICD-10-CM | POA: Diagnosis not present

## 2013-08-11 ENCOUNTER — Ambulatory Visit (INDEPENDENT_AMBULATORY_CARE_PROVIDER_SITE_OTHER): Payer: Medicare Other | Admitting: *Deleted

## 2013-08-11 DIAGNOSIS — Z5181 Encounter for therapeutic drug level monitoring: Secondary | ICD-10-CM | POA: Insufficient documentation

## 2013-08-11 DIAGNOSIS — I4891 Unspecified atrial fibrillation: Secondary | ICD-10-CM

## 2013-08-11 DIAGNOSIS — Z7901 Long term (current) use of anticoagulants: Secondary | ICD-10-CM | POA: Diagnosis not present

## 2013-08-11 LAB — POCT INR: INR: 1.6

## 2013-08-21 DIAGNOSIS — M4712 Other spondylosis with myelopathy, cervical region: Secondary | ICD-10-CM | POA: Diagnosis not present

## 2013-08-21 DIAGNOSIS — M531 Cervicobrachial syndrome: Secondary | ICD-10-CM | POA: Diagnosis not present

## 2013-08-21 DIAGNOSIS — M9981 Other biomechanical lesions of cervical region: Secondary | ICD-10-CM | POA: Diagnosis not present

## 2013-09-01 ENCOUNTER — Ambulatory Visit (INDEPENDENT_AMBULATORY_CARE_PROVIDER_SITE_OTHER): Payer: Medicare Other | Admitting: *Deleted

## 2013-09-01 DIAGNOSIS — Z5181 Encounter for therapeutic drug level monitoring: Secondary | ICD-10-CM | POA: Diagnosis not present

## 2013-09-01 DIAGNOSIS — Z7901 Long term (current) use of anticoagulants: Secondary | ICD-10-CM | POA: Diagnosis not present

## 2013-09-01 DIAGNOSIS — I4891 Unspecified atrial fibrillation: Secondary | ICD-10-CM

## 2013-09-01 LAB — POCT INR: INR: 2.2

## 2013-09-04 DIAGNOSIS — M9981 Other biomechanical lesions of cervical region: Secondary | ICD-10-CM | POA: Diagnosis not present

## 2013-09-04 DIAGNOSIS — M4712 Other spondylosis with myelopathy, cervical region: Secondary | ICD-10-CM | POA: Diagnosis not present

## 2013-09-04 DIAGNOSIS — M531 Cervicobrachial syndrome: Secondary | ICD-10-CM | POA: Diagnosis not present

## 2013-09-05 DIAGNOSIS — H04129 Dry eye syndrome of unspecified lacrimal gland: Secondary | ICD-10-CM | POA: Diagnosis not present

## 2013-09-05 DIAGNOSIS — H01009 Unspecified blepharitis unspecified eye, unspecified eyelid: Secondary | ICD-10-CM | POA: Diagnosis not present

## 2013-09-07 DIAGNOSIS — M4712 Other spondylosis with myelopathy, cervical region: Secondary | ICD-10-CM | POA: Diagnosis not present

## 2013-09-07 DIAGNOSIS — M531 Cervicobrachial syndrome: Secondary | ICD-10-CM | POA: Diagnosis not present

## 2013-09-07 DIAGNOSIS — M9981 Other biomechanical lesions of cervical region: Secondary | ICD-10-CM | POA: Diagnosis not present

## 2013-09-13 DIAGNOSIS — K219 Gastro-esophageal reflux disease without esophagitis: Secondary | ICD-10-CM | POA: Diagnosis not present

## 2013-09-13 DIAGNOSIS — E782 Mixed hyperlipidemia: Secondary | ICD-10-CM | POA: Diagnosis not present

## 2013-09-13 DIAGNOSIS — I10 Essential (primary) hypertension: Secondary | ICD-10-CM | POA: Diagnosis not present

## 2013-09-14 DIAGNOSIS — M4712 Other spondylosis with myelopathy, cervical region: Secondary | ICD-10-CM | POA: Diagnosis not present

## 2013-09-14 DIAGNOSIS — M531 Cervicobrachial syndrome: Secondary | ICD-10-CM | POA: Diagnosis not present

## 2013-09-14 DIAGNOSIS — M9981 Other biomechanical lesions of cervical region: Secondary | ICD-10-CM | POA: Diagnosis not present

## 2013-09-21 DIAGNOSIS — K219 Gastro-esophageal reflux disease without esophagitis: Secondary | ICD-10-CM | POA: Diagnosis not present

## 2013-09-21 DIAGNOSIS — B0229 Other postherpetic nervous system involvement: Secondary | ICD-10-CM | POA: Diagnosis not present

## 2013-09-21 DIAGNOSIS — E782 Mixed hyperlipidemia: Secondary | ICD-10-CM | POA: Diagnosis not present

## 2013-09-21 DIAGNOSIS — M1990 Unspecified osteoarthritis, unspecified site: Secondary | ICD-10-CM | POA: Diagnosis not present

## 2013-09-21 DIAGNOSIS — I4891 Unspecified atrial fibrillation: Secondary | ICD-10-CM | POA: Diagnosis not present

## 2013-09-21 DIAGNOSIS — M719 Bursopathy, unspecified: Secondary | ICD-10-CM | POA: Diagnosis not present

## 2013-09-21 DIAGNOSIS — M9981 Other biomechanical lesions of cervical region: Secondary | ICD-10-CM | POA: Diagnosis not present

## 2013-09-21 DIAGNOSIS — M531 Cervicobrachial syndrome: Secondary | ICD-10-CM | POA: Diagnosis not present

## 2013-09-21 DIAGNOSIS — M67919 Unspecified disorder of synovium and tendon, unspecified shoulder: Secondary | ICD-10-CM | POA: Diagnosis not present

## 2013-09-21 DIAGNOSIS — I509 Heart failure, unspecified: Secondary | ICD-10-CM | POA: Diagnosis not present

## 2013-09-21 DIAGNOSIS — I10 Essential (primary) hypertension: Secondary | ICD-10-CM | POA: Diagnosis not present

## 2013-09-21 DIAGNOSIS — M4712 Other spondylosis with myelopathy, cervical region: Secondary | ICD-10-CM | POA: Diagnosis not present

## 2013-09-22 ENCOUNTER — Ambulatory Visit (INDEPENDENT_AMBULATORY_CARE_PROVIDER_SITE_OTHER): Payer: Medicare Other | Admitting: *Deleted

## 2013-09-22 DIAGNOSIS — Z7901 Long term (current) use of anticoagulants: Secondary | ICD-10-CM | POA: Diagnosis not present

## 2013-09-22 DIAGNOSIS — I4891 Unspecified atrial fibrillation: Secondary | ICD-10-CM

## 2013-09-22 DIAGNOSIS — Z5181 Encounter for therapeutic drug level monitoring: Secondary | ICD-10-CM

## 2013-09-22 LAB — POCT INR: INR: 2.3

## 2013-09-28 DIAGNOSIS — M9981 Other biomechanical lesions of cervical region: Secondary | ICD-10-CM | POA: Diagnosis not present

## 2013-09-28 DIAGNOSIS — M4712 Other spondylosis with myelopathy, cervical region: Secondary | ICD-10-CM | POA: Diagnosis not present

## 2013-09-28 DIAGNOSIS — M531 Cervicobrachial syndrome: Secondary | ICD-10-CM | POA: Diagnosis not present

## 2013-10-05 DIAGNOSIS — M4712 Other spondylosis with myelopathy, cervical region: Secondary | ICD-10-CM | POA: Diagnosis not present

## 2013-10-05 DIAGNOSIS — M531 Cervicobrachial syndrome: Secondary | ICD-10-CM | POA: Diagnosis not present

## 2013-10-05 DIAGNOSIS — M9981 Other biomechanical lesions of cervical region: Secondary | ICD-10-CM | POA: Diagnosis not present

## 2013-10-12 DIAGNOSIS — M9981 Other biomechanical lesions of cervical region: Secondary | ICD-10-CM | POA: Diagnosis not present

## 2013-10-12 DIAGNOSIS — M531 Cervicobrachial syndrome: Secondary | ICD-10-CM | POA: Diagnosis not present

## 2013-10-12 DIAGNOSIS — M4712 Other spondylosis with myelopathy, cervical region: Secondary | ICD-10-CM | POA: Diagnosis not present

## 2013-10-19 DIAGNOSIS — M531 Cervicobrachial syndrome: Secondary | ICD-10-CM | POA: Diagnosis not present

## 2013-10-19 DIAGNOSIS — M4712 Other spondylosis with myelopathy, cervical region: Secondary | ICD-10-CM | POA: Diagnosis not present

## 2013-10-19 DIAGNOSIS — M9981 Other biomechanical lesions of cervical region: Secondary | ICD-10-CM | POA: Diagnosis not present

## 2013-10-20 ENCOUNTER — Ambulatory Visit (INDEPENDENT_AMBULATORY_CARE_PROVIDER_SITE_OTHER): Payer: Medicare Other | Admitting: *Deleted

## 2013-10-20 DIAGNOSIS — Z7901 Long term (current) use of anticoagulants: Secondary | ICD-10-CM

## 2013-10-20 DIAGNOSIS — Z5181 Encounter for therapeutic drug level monitoring: Secondary | ICD-10-CM

## 2013-10-20 DIAGNOSIS — I4891 Unspecified atrial fibrillation: Secondary | ICD-10-CM | POA: Diagnosis not present

## 2013-10-20 LAB — POCT INR: INR: 2.8

## 2013-11-02 DIAGNOSIS — M4712 Other spondylosis with myelopathy, cervical region: Secondary | ICD-10-CM | POA: Diagnosis not present

## 2013-11-02 DIAGNOSIS — M9981 Other biomechanical lesions of cervical region: Secondary | ICD-10-CM | POA: Diagnosis not present

## 2013-11-02 DIAGNOSIS — M531 Cervicobrachial syndrome: Secondary | ICD-10-CM | POA: Diagnosis not present

## 2013-11-09 DIAGNOSIS — M531 Cervicobrachial syndrome: Secondary | ICD-10-CM | POA: Diagnosis not present

## 2013-11-09 DIAGNOSIS — M9981 Other biomechanical lesions of cervical region: Secondary | ICD-10-CM | POA: Diagnosis not present

## 2013-11-09 DIAGNOSIS — M4712 Other spondylosis with myelopathy, cervical region: Secondary | ICD-10-CM | POA: Diagnosis not present

## 2013-11-16 DIAGNOSIS — M9981 Other biomechanical lesions of cervical region: Secondary | ICD-10-CM | POA: Diagnosis not present

## 2013-11-16 DIAGNOSIS — M531 Cervicobrachial syndrome: Secondary | ICD-10-CM | POA: Diagnosis not present

## 2013-11-16 DIAGNOSIS — M4712 Other spondylosis with myelopathy, cervical region: Secondary | ICD-10-CM | POA: Diagnosis not present

## 2013-11-17 ENCOUNTER — Ambulatory Visit (INDEPENDENT_AMBULATORY_CARE_PROVIDER_SITE_OTHER): Payer: Medicare Other | Admitting: *Deleted

## 2013-11-17 DIAGNOSIS — Z7901 Long term (current) use of anticoagulants: Secondary | ICD-10-CM | POA: Diagnosis not present

## 2013-11-17 DIAGNOSIS — Z5181 Encounter for therapeutic drug level monitoring: Secondary | ICD-10-CM

## 2013-11-17 DIAGNOSIS — I4891 Unspecified atrial fibrillation: Secondary | ICD-10-CM | POA: Diagnosis not present

## 2013-11-17 LAB — POCT INR: INR: 2.4

## 2013-11-23 DIAGNOSIS — M4712 Other spondylosis with myelopathy, cervical region: Secondary | ICD-10-CM | POA: Diagnosis not present

## 2013-11-23 DIAGNOSIS — M9981 Other biomechanical lesions of cervical region: Secondary | ICD-10-CM | POA: Diagnosis not present

## 2013-11-23 DIAGNOSIS — M531 Cervicobrachial syndrome: Secondary | ICD-10-CM | POA: Diagnosis not present

## 2013-11-30 DIAGNOSIS — M531 Cervicobrachial syndrome: Secondary | ICD-10-CM | POA: Diagnosis not present

## 2013-11-30 DIAGNOSIS — M9981 Other biomechanical lesions of cervical region: Secondary | ICD-10-CM | POA: Diagnosis not present

## 2013-11-30 DIAGNOSIS — M4712 Other spondylosis with myelopathy, cervical region: Secondary | ICD-10-CM | POA: Diagnosis not present

## 2013-12-07 DIAGNOSIS — M531 Cervicobrachial syndrome: Secondary | ICD-10-CM | POA: Diagnosis not present

## 2013-12-07 DIAGNOSIS — M4712 Other spondylosis with myelopathy, cervical region: Secondary | ICD-10-CM | POA: Diagnosis not present

## 2013-12-07 DIAGNOSIS — M9981 Other biomechanical lesions of cervical region: Secondary | ICD-10-CM | POA: Diagnosis not present

## 2014-01-02 ENCOUNTER — Ambulatory Visit (INDEPENDENT_AMBULATORY_CARE_PROVIDER_SITE_OTHER): Payer: Medicare Other | Admitting: *Deleted

## 2014-01-02 DIAGNOSIS — Z7901 Long term (current) use of anticoagulants: Secondary | ICD-10-CM

## 2014-01-02 DIAGNOSIS — I4891 Unspecified atrial fibrillation: Secondary | ICD-10-CM | POA: Diagnosis not present

## 2014-01-02 DIAGNOSIS — Z5181 Encounter for therapeutic drug level monitoring: Secondary | ICD-10-CM | POA: Diagnosis not present

## 2014-01-02 LAB — POCT INR: INR: 1.8

## 2014-01-04 DIAGNOSIS — M531 Cervicobrachial syndrome: Secondary | ICD-10-CM | POA: Diagnosis not present

## 2014-01-04 DIAGNOSIS — M9981 Other biomechanical lesions of cervical region: Secondary | ICD-10-CM | POA: Diagnosis not present

## 2014-01-04 DIAGNOSIS — M4712 Other spondylosis with myelopathy, cervical region: Secondary | ICD-10-CM | POA: Diagnosis not present

## 2014-01-22 DIAGNOSIS — E782 Mixed hyperlipidemia: Secondary | ICD-10-CM | POA: Diagnosis not present

## 2014-01-22 DIAGNOSIS — K219 Gastro-esophageal reflux disease without esophagitis: Secondary | ICD-10-CM | POA: Diagnosis not present

## 2014-01-22 DIAGNOSIS — I4891 Unspecified atrial fibrillation: Secondary | ICD-10-CM | POA: Diagnosis not present

## 2014-01-22 DIAGNOSIS — I10 Essential (primary) hypertension: Secondary | ICD-10-CM | POA: Diagnosis not present

## 2014-01-22 DIAGNOSIS — E119 Type 2 diabetes mellitus without complications: Secondary | ICD-10-CM | POA: Diagnosis not present

## 2014-01-23 ENCOUNTER — Ambulatory Visit (INDEPENDENT_AMBULATORY_CARE_PROVIDER_SITE_OTHER): Payer: Medicare Other | Admitting: *Deleted

## 2014-01-23 DIAGNOSIS — Z7901 Long term (current) use of anticoagulants: Secondary | ICD-10-CM

## 2014-01-23 DIAGNOSIS — I4891 Unspecified atrial fibrillation: Secondary | ICD-10-CM

## 2014-01-23 DIAGNOSIS — Z5181 Encounter for therapeutic drug level monitoring: Secondary | ICD-10-CM | POA: Diagnosis not present

## 2014-01-23 LAB — POCT INR: INR: 2.1

## 2014-01-25 DIAGNOSIS — M4712 Other spondylosis with myelopathy, cervical region: Secondary | ICD-10-CM | POA: Diagnosis not present

## 2014-01-25 DIAGNOSIS — M531 Cervicobrachial syndrome: Secondary | ICD-10-CM | POA: Diagnosis not present

## 2014-01-25 DIAGNOSIS — M9981 Other biomechanical lesions of cervical region: Secondary | ICD-10-CM | POA: Diagnosis not present

## 2014-01-29 DIAGNOSIS — IMO0001 Reserved for inherently not codable concepts without codable children: Secondary | ICD-10-CM | POA: Diagnosis not present

## 2014-01-29 DIAGNOSIS — B0229 Other postherpetic nervous system involvement: Secondary | ICD-10-CM | POA: Diagnosis not present

## 2014-01-29 DIAGNOSIS — I509 Heart failure, unspecified: Secondary | ICD-10-CM | POA: Diagnosis not present

## 2014-01-29 DIAGNOSIS — E782 Mixed hyperlipidemia: Secondary | ICD-10-CM | POA: Diagnosis not present

## 2014-01-29 DIAGNOSIS — K219 Gastro-esophageal reflux disease without esophagitis: Secondary | ICD-10-CM | POA: Diagnosis not present

## 2014-01-29 DIAGNOSIS — M1990 Unspecified osteoarthritis, unspecified site: Secondary | ICD-10-CM | POA: Diagnosis not present

## 2014-01-29 DIAGNOSIS — I10 Essential (primary) hypertension: Secondary | ICD-10-CM | POA: Diagnosis not present

## 2014-01-29 DIAGNOSIS — I4891 Unspecified atrial fibrillation: Secondary | ICD-10-CM | POA: Diagnosis not present

## 2014-02-20 ENCOUNTER — Ambulatory Visit (INDEPENDENT_AMBULATORY_CARE_PROVIDER_SITE_OTHER): Payer: Medicare Other | Admitting: *Deleted

## 2014-02-20 DIAGNOSIS — Z5181 Encounter for therapeutic drug level monitoring: Secondary | ICD-10-CM | POA: Diagnosis not present

## 2014-02-20 DIAGNOSIS — Z7901 Long term (current) use of anticoagulants: Secondary | ICD-10-CM

## 2014-02-20 DIAGNOSIS — I4891 Unspecified atrial fibrillation: Secondary | ICD-10-CM | POA: Diagnosis not present

## 2014-02-20 LAB — POCT INR: INR: 2.3

## 2014-03-13 DIAGNOSIS — Z23 Encounter for immunization: Secondary | ICD-10-CM | POA: Diagnosis not present

## 2014-03-20 ENCOUNTER — Ambulatory Visit (INDEPENDENT_AMBULATORY_CARE_PROVIDER_SITE_OTHER): Payer: Medicare Other | Admitting: *Deleted

## 2014-03-20 DIAGNOSIS — Z5181 Encounter for therapeutic drug level monitoring: Secondary | ICD-10-CM | POA: Diagnosis not present

## 2014-03-20 DIAGNOSIS — Z7901 Long term (current) use of anticoagulants: Secondary | ICD-10-CM

## 2014-03-20 DIAGNOSIS — I4891 Unspecified atrial fibrillation: Secondary | ICD-10-CM

## 2014-03-20 LAB — POCT INR: INR: 2.4

## 2014-04-03 DIAGNOSIS — E119 Type 2 diabetes mellitus without complications: Secondary | ICD-10-CM | POA: Diagnosis not present

## 2014-04-03 DIAGNOSIS — H26493 Other secondary cataract, bilateral: Secondary | ICD-10-CM | POA: Diagnosis not present

## 2014-04-03 DIAGNOSIS — H1045 Other chronic allergic conjunctivitis: Secondary | ICD-10-CM | POA: Diagnosis not present

## 2014-04-03 DIAGNOSIS — Z961 Presence of intraocular lens: Secondary | ICD-10-CM | POA: Diagnosis not present

## 2014-04-09 ENCOUNTER — Encounter: Payer: Self-pay | Admitting: Cardiology

## 2014-04-09 ENCOUNTER — Ambulatory Visit (INDEPENDENT_AMBULATORY_CARE_PROVIDER_SITE_OTHER): Payer: Medicare Other | Admitting: Cardiology

## 2014-04-09 VITALS — BP 115/72 | HR 50 | Ht 67.0 in | Wt 183.2 lb

## 2014-04-09 DIAGNOSIS — I454 Nonspecific intraventricular block: Secondary | ICD-10-CM

## 2014-04-09 DIAGNOSIS — I340 Nonrheumatic mitral (valve) insufficiency: Secondary | ICD-10-CM

## 2014-04-09 DIAGNOSIS — I482 Chronic atrial fibrillation, unspecified: Secondary | ICD-10-CM | POA: Insufficient documentation

## 2014-04-09 DIAGNOSIS — I4891 Unspecified atrial fibrillation: Secondary | ICD-10-CM

## 2014-04-09 NOTE — Assessment & Plan Note (Signed)
The patient had mild mitral regurgitation in the past. She had normal left jugular function. Her last echo was greater than 5 years ago. I consider proceeding with an echo soon. However the patient has been dealing with other medical issues and prefers not to have testing at this time. Followup 2-D echo can be considered when she seen in followup next year.

## 2014-04-09 NOTE — Assessment & Plan Note (Signed)
Patient has chronic atrial fibrillation. The rate is controlled. She is anticoagulated. No change in therapy.

## 2014-04-09 NOTE — Assessment & Plan Note (Signed)
Patient has an old interventricular conduction delay. No change in therapy.

## 2014-04-09 NOTE — Patient Instructions (Signed)
Your physician recommends that you schedule a follow-up appointment in: 1 year with Dr. Bronson Ing. You will receive a reminder letter in the mail in about 10 months reminding you to call and schedule your appointment. If you don't receive this letter, please contact our office. Your physician recommends that you continue on your current medications as directed. Please refer to the Current Medication list given to you today.

## 2014-04-09 NOTE — Progress Notes (Signed)
Patient ID: Sharon Little, female   DOB: 03/21/1929, 80 y.o.   MRN: [id number]    HPI  Patient is seen today to followup atrial fibrillation. I saw her last October, 2014. She's actually doing well. She does not have any significant palpitations. She is anticoagulated without difficulties. She has not had syncope or presyncope.  Allergies  Allergen Reactions  . Penicillins     REACTION: rash    Current Outpatient Prescriptions  Medication Sig Dispense Refill  . acetaminophen (TYLENOL) 650 MG CR tablet Take 650 mg by mouth every 8 (eight) hours as needed.        . B Complex Vitamins (B COMPLEX-B12) TABS Take by mouth.      . Cholecalciferol (VITAMIN D3) 1000 UNITS tablet Take 1,000 Units by mouth daily.        Marland Kitchen diltiazem (CARDIZEM CD) 240 MG 24 hr capsule Take 240 mg by mouth daily.       . furosemide (LASIX) 40 MG tablet Take 1 tablet by mouth Twice daily.      . hydrochlorothiazide 25 MG tablet Take 25 mg by mouth daily.        Marland Kitchen lovastatin (MEVACOR) 20 MG tablet Take 20 mg by mouth daily.        . metFORMIN (GLUCOPHAGE) 500 MG tablet Take by mouth 4 (four) times daily.      . Multiple Vitamin (MULTIVITAMIN) tablet Take 1 tablet by mouth daily.        . traMADol (ULTRAM) 50 MG tablet Take 50 mg by mouth every 6 (six) hours as needed for pain.      Marland Kitchen warfarin (COUMADIN) 4 MG tablet Take by mouth as directed.        No current facility-administered medications for this visit.    History   Social History  . Marital Status: Divorced    Spouse Name: N/A    Number of Children: N/A  . Years of Education: N/A   Occupational History  . Not on file.   Social History Main Topics  . Smoking status: Former Smoker -- 2.00 packs/day for 24 years    Types: Cigarettes    Quit date: 06/29/1968  . Smokeless tobacco: Never Used  . Alcohol Use: No  . Drug Use: No  . Sexual Activity: Not on file   Other Topics Concern  . Not on file   Social History Narrative   Divorced, retired.      No family history on file.  Past Medical History  Diagnosis Date  . Dyslipidemia     mixed  . Edema     Chronic lower extremity edema  . MR (mitral regurgitation)     mild; echo 6/07 (echo also showed nml LV function)  . A-fib   . IVCD (intraventricular conduction defect)   . Abnormal CT scan, chest     2005-2007; stable liver mass; also hypodense liver lesions   . Ejection fraction     EF normal, echo, 2007  . Warfarin anticoagulation   . Chest discomfort     Nuclear, 2007, no ischemia, possible attenuation artifact  . Back pain     To see Dr. Carloyn Manner in September, 2013    Past Surgical History  Procedure Laterality Date  . Nuclear test  6/07    no ischemia, possible attenuation artifact     Patient Active Problem List   Diagnosis Date Noted  . Chronic atrial fibrillation 04/09/2014  . Encounter for therapeutic drug monitoring 08/11/2013  .  Back pain   . Dyslipidemia   . Edema   . MR (mitral regurgitation)   . IVCD (intraventricular conduction defect)   . Abnormal CT scan, chest   . Ejection fraction   . Warfarin anticoagulation   . Chest discomfort     ROS   Patient denies fever, chills, headache, sweats, rash, change in vision, change in hearing, chest pain, cough, nausea or vomiting, urinary symptoms. All other systems are reviewed and are negative.  PHYSICAL EXAM  Patient is oriented to person time and place. Affect is normal. Head is atraumatic. Lungs are clear. Respiratory effort is nonlabored. There is no jugulovenous distention. Cardiac exam reveals S1 and S2. The rate is controlled. The rhythm is irregularly irregular. There is a systolic murmur. The abdomen is soft. There is no peripheral edema.  Filed Vitals:   04/09/14 1249  BP: 115/72  Pulse: 50  Height: 5\' 7"  (1.702 m)  Weight: 183 lb 4 oz (83.122 kg)  SpO2: 96%    EKG is done today and reviewed by me. There is old atrial fibrillation. The rate is controlled. There is old interventricular  conduction delay.  ASSESSMENT & PLAN

## 2014-05-01 ENCOUNTER — Ambulatory Visit (INDEPENDENT_AMBULATORY_CARE_PROVIDER_SITE_OTHER): Payer: Medicare Other | Admitting: *Deleted

## 2014-05-01 DIAGNOSIS — Z5181 Encounter for therapeutic drug level monitoring: Secondary | ICD-10-CM

## 2014-05-01 DIAGNOSIS — I4891 Unspecified atrial fibrillation: Secondary | ICD-10-CM

## 2014-05-01 DIAGNOSIS — Z7901 Long term (current) use of anticoagulants: Secondary | ICD-10-CM

## 2014-05-01 LAB — POCT INR: INR: 2.5

## 2014-05-04 DIAGNOSIS — L03317 Cellulitis of buttock: Secondary | ICD-10-CM | POA: Diagnosis not present

## 2014-05-23 DIAGNOSIS — E782 Mixed hyperlipidemia: Secondary | ICD-10-CM | POA: Diagnosis not present

## 2014-05-23 DIAGNOSIS — K219 Gastro-esophageal reflux disease without esophagitis: Secondary | ICD-10-CM | POA: Diagnosis not present

## 2014-05-23 DIAGNOSIS — E119 Type 2 diabetes mellitus without complications: Secondary | ICD-10-CM | POA: Diagnosis not present

## 2014-05-23 DIAGNOSIS — I10 Essential (primary) hypertension: Secondary | ICD-10-CM | POA: Diagnosis not present

## 2014-05-28 DIAGNOSIS — Z1231 Encounter for screening mammogram for malignant neoplasm of breast: Secondary | ICD-10-CM | POA: Diagnosis not present

## 2014-05-30 DIAGNOSIS — M545 Low back pain: Secondary | ICD-10-CM | POA: Diagnosis not present

## 2014-05-30 DIAGNOSIS — E119 Type 2 diabetes mellitus without complications: Secondary | ICD-10-CM | POA: Diagnosis not present

## 2014-05-30 DIAGNOSIS — E782 Mixed hyperlipidemia: Secondary | ICD-10-CM | POA: Diagnosis not present

## 2014-05-30 DIAGNOSIS — I482 Chronic atrial fibrillation: Secondary | ICD-10-CM | POA: Diagnosis not present

## 2014-05-30 DIAGNOSIS — Z1389 Encounter for screening for other disorder: Secondary | ICD-10-CM | POA: Diagnosis not present

## 2014-05-30 DIAGNOSIS — I10 Essential (primary) hypertension: Secondary | ICD-10-CM | POA: Diagnosis not present

## 2014-05-30 DIAGNOSIS — Z9189 Other specified personal risk factors, not elsewhere classified: Secondary | ICD-10-CM | POA: Diagnosis not present

## 2014-06-04 DIAGNOSIS — R2689 Other abnormalities of gait and mobility: Secondary | ICD-10-CM | POA: Diagnosis not present

## 2014-06-04 DIAGNOSIS — M6281 Muscle weakness (generalized): Secondary | ICD-10-CM | POA: Diagnosis not present

## 2014-06-04 DIAGNOSIS — M545 Low back pain: Secondary | ICD-10-CM | POA: Diagnosis not present

## 2014-06-06 DIAGNOSIS — M6281 Muscle weakness (generalized): Secondary | ICD-10-CM | POA: Diagnosis not present

## 2014-06-06 DIAGNOSIS — R2689 Other abnormalities of gait and mobility: Secondary | ICD-10-CM | POA: Diagnosis not present

## 2014-06-06 DIAGNOSIS — M545 Low back pain: Secondary | ICD-10-CM | POA: Diagnosis not present

## 2014-06-08 DIAGNOSIS — R2689 Other abnormalities of gait and mobility: Secondary | ICD-10-CM | POA: Diagnosis not present

## 2014-06-08 DIAGNOSIS — M6281 Muscle weakness (generalized): Secondary | ICD-10-CM | POA: Diagnosis not present

## 2014-06-08 DIAGNOSIS — M545 Low back pain: Secondary | ICD-10-CM | POA: Diagnosis not present

## 2014-06-11 DIAGNOSIS — M6281 Muscle weakness (generalized): Secondary | ICD-10-CM | POA: Diagnosis not present

## 2014-06-11 DIAGNOSIS — R2689 Other abnormalities of gait and mobility: Secondary | ICD-10-CM | POA: Diagnosis not present

## 2014-06-11 DIAGNOSIS — M545 Low back pain: Secondary | ICD-10-CM | POA: Diagnosis not present

## 2014-06-12 ENCOUNTER — Ambulatory Visit (INDEPENDENT_AMBULATORY_CARE_PROVIDER_SITE_OTHER): Payer: Medicare Other | Admitting: *Deleted

## 2014-06-12 DIAGNOSIS — Z5181 Encounter for therapeutic drug level monitoring: Secondary | ICD-10-CM | POA: Diagnosis not present

## 2014-06-12 DIAGNOSIS — Z7901 Long term (current) use of anticoagulants: Secondary | ICD-10-CM

## 2014-06-12 DIAGNOSIS — I4891 Unspecified atrial fibrillation: Secondary | ICD-10-CM

## 2014-06-12 LAB — POCT INR: INR: 2.2

## 2014-06-13 DIAGNOSIS — R2689 Other abnormalities of gait and mobility: Secondary | ICD-10-CM | POA: Diagnosis not present

## 2014-06-13 DIAGNOSIS — M6281 Muscle weakness (generalized): Secondary | ICD-10-CM | POA: Diagnosis not present

## 2014-06-13 DIAGNOSIS — M545 Low back pain: Secondary | ICD-10-CM | POA: Diagnosis not present

## 2014-06-15 DIAGNOSIS — M545 Low back pain: Secondary | ICD-10-CM | POA: Diagnosis not present

## 2014-06-15 DIAGNOSIS — R2689 Other abnormalities of gait and mobility: Secondary | ICD-10-CM | POA: Diagnosis not present

## 2014-06-15 DIAGNOSIS — M6281 Muscle weakness (generalized): Secondary | ICD-10-CM | POA: Diagnosis not present

## 2014-06-18 DIAGNOSIS — M6281 Muscle weakness (generalized): Secondary | ICD-10-CM | POA: Diagnosis not present

## 2014-06-18 DIAGNOSIS — M545 Low back pain: Secondary | ICD-10-CM | POA: Diagnosis not present

## 2014-06-18 DIAGNOSIS — R2689 Other abnormalities of gait and mobility: Secondary | ICD-10-CM | POA: Diagnosis not present

## 2014-06-19 ENCOUNTER — Telehealth: Payer: Self-pay | Admitting: *Deleted

## 2014-06-19 NOTE — Telephone Encounter (Signed)
Sharon Little missed her dose on Sunday and wants to know what she needs to do.

## 2014-06-19 NOTE — Telephone Encounter (Signed)
Told pt to take coumadin 1 tablet tonight then resume regular dose.

## 2014-06-20 DIAGNOSIS — R2689 Other abnormalities of gait and mobility: Secondary | ICD-10-CM | POA: Diagnosis not present

## 2014-06-20 DIAGNOSIS — M6281 Muscle weakness (generalized): Secondary | ICD-10-CM | POA: Diagnosis not present

## 2014-06-20 DIAGNOSIS — M545 Low back pain: Secondary | ICD-10-CM | POA: Diagnosis not present

## 2014-06-25 DIAGNOSIS — M6281 Muscle weakness (generalized): Secondary | ICD-10-CM | POA: Diagnosis not present

## 2014-06-25 DIAGNOSIS — M545 Low back pain: Secondary | ICD-10-CM | POA: Diagnosis not present

## 2014-06-25 DIAGNOSIS — R2689 Other abnormalities of gait and mobility: Secondary | ICD-10-CM | POA: Diagnosis not present

## 2014-06-27 DIAGNOSIS — R2689 Other abnormalities of gait and mobility: Secondary | ICD-10-CM | POA: Diagnosis not present

## 2014-06-27 DIAGNOSIS — M545 Low back pain: Secondary | ICD-10-CM | POA: Diagnosis not present

## 2014-06-27 DIAGNOSIS — M6281 Muscle weakness (generalized): Secondary | ICD-10-CM | POA: Diagnosis not present

## 2014-06-28 DIAGNOSIS — R2689 Other abnormalities of gait and mobility: Secondary | ICD-10-CM | POA: Diagnosis not present

## 2014-06-28 DIAGNOSIS — M6281 Muscle weakness (generalized): Secondary | ICD-10-CM | POA: Diagnosis not present

## 2014-06-28 DIAGNOSIS — M545 Low back pain: Secondary | ICD-10-CM | POA: Diagnosis not present

## 2014-07-02 DIAGNOSIS — M545 Low back pain: Secondary | ICD-10-CM | POA: Diagnosis not present

## 2014-07-02 DIAGNOSIS — R2689 Other abnormalities of gait and mobility: Secondary | ICD-10-CM | POA: Diagnosis not present

## 2014-07-02 DIAGNOSIS — M6281 Muscle weakness (generalized): Secondary | ICD-10-CM | POA: Diagnosis not present

## 2014-07-04 DIAGNOSIS — R2689 Other abnormalities of gait and mobility: Secondary | ICD-10-CM | POA: Diagnosis not present

## 2014-07-04 DIAGNOSIS — M6281 Muscle weakness (generalized): Secondary | ICD-10-CM | POA: Diagnosis not present

## 2014-07-04 DIAGNOSIS — M545 Low back pain: Secondary | ICD-10-CM | POA: Diagnosis not present

## 2014-07-06 DIAGNOSIS — R2689 Other abnormalities of gait and mobility: Secondary | ICD-10-CM | POA: Diagnosis not present

## 2014-07-06 DIAGNOSIS — M545 Low back pain: Secondary | ICD-10-CM | POA: Diagnosis not present

## 2014-07-06 DIAGNOSIS — M6281 Muscle weakness (generalized): Secondary | ICD-10-CM | POA: Diagnosis not present

## 2014-07-09 DIAGNOSIS — M6281 Muscle weakness (generalized): Secondary | ICD-10-CM | POA: Diagnosis not present

## 2014-07-09 DIAGNOSIS — M545 Low back pain: Secondary | ICD-10-CM | POA: Diagnosis not present

## 2014-07-09 DIAGNOSIS — R2689 Other abnormalities of gait and mobility: Secondary | ICD-10-CM | POA: Diagnosis not present

## 2014-07-11 DIAGNOSIS — R2689 Other abnormalities of gait and mobility: Secondary | ICD-10-CM | POA: Diagnosis not present

## 2014-07-11 DIAGNOSIS — M545 Low back pain: Secondary | ICD-10-CM | POA: Diagnosis not present

## 2014-07-11 DIAGNOSIS — M6281 Muscle weakness (generalized): Secondary | ICD-10-CM | POA: Diagnosis not present

## 2014-07-13 DIAGNOSIS — R2689 Other abnormalities of gait and mobility: Secondary | ICD-10-CM | POA: Diagnosis not present

## 2014-07-13 DIAGNOSIS — M6281 Muscle weakness (generalized): Secondary | ICD-10-CM | POA: Diagnosis not present

## 2014-07-13 DIAGNOSIS — M545 Low back pain: Secondary | ICD-10-CM | POA: Diagnosis not present

## 2014-07-16 DIAGNOSIS — M545 Low back pain: Secondary | ICD-10-CM | POA: Diagnosis not present

## 2014-07-16 DIAGNOSIS — M6281 Muscle weakness (generalized): Secondary | ICD-10-CM | POA: Diagnosis not present

## 2014-07-16 DIAGNOSIS — R2689 Other abnormalities of gait and mobility: Secondary | ICD-10-CM | POA: Diagnosis not present

## 2014-07-17 DIAGNOSIS — M6281 Muscle weakness (generalized): Secondary | ICD-10-CM | POA: Diagnosis not present

## 2014-07-17 DIAGNOSIS — M545 Low back pain: Secondary | ICD-10-CM | POA: Diagnosis not present

## 2014-07-17 DIAGNOSIS — R2689 Other abnormalities of gait and mobility: Secondary | ICD-10-CM | POA: Diagnosis not present

## 2014-07-26 ENCOUNTER — Ambulatory Visit (INDEPENDENT_AMBULATORY_CARE_PROVIDER_SITE_OTHER): Payer: Medicare Other | Admitting: *Deleted

## 2014-07-26 DIAGNOSIS — Z7901 Long term (current) use of anticoagulants: Secondary | ICD-10-CM | POA: Diagnosis not present

## 2014-07-26 DIAGNOSIS — Z5181 Encounter for therapeutic drug level monitoring: Secondary | ICD-10-CM | POA: Diagnosis not present

## 2014-07-26 DIAGNOSIS — I4891 Unspecified atrial fibrillation: Secondary | ICD-10-CM | POA: Diagnosis not present

## 2014-07-26 LAB — POCT INR: INR: 1.4

## 2014-08-09 ENCOUNTER — Ambulatory Visit (INDEPENDENT_AMBULATORY_CARE_PROVIDER_SITE_OTHER): Payer: Medicare Other | Admitting: *Deleted

## 2014-08-09 DIAGNOSIS — Z7901 Long term (current) use of anticoagulants: Secondary | ICD-10-CM

## 2014-08-09 DIAGNOSIS — Z5181 Encounter for therapeutic drug level monitoring: Secondary | ICD-10-CM

## 2014-08-09 DIAGNOSIS — I4891 Unspecified atrial fibrillation: Secondary | ICD-10-CM | POA: Diagnosis not present

## 2014-08-09 LAB — POCT INR: INR: 1.7

## 2014-08-28 ENCOUNTER — Ambulatory Visit (INDEPENDENT_AMBULATORY_CARE_PROVIDER_SITE_OTHER): Payer: Medicare Other | Admitting: *Deleted

## 2014-08-28 DIAGNOSIS — Z7901 Long term (current) use of anticoagulants: Secondary | ICD-10-CM

## 2014-08-28 DIAGNOSIS — Z5181 Encounter for therapeutic drug level monitoring: Secondary | ICD-10-CM

## 2014-08-28 DIAGNOSIS — I4891 Unspecified atrial fibrillation: Secondary | ICD-10-CM

## 2014-08-28 LAB — POCT INR: INR: 1.3

## 2014-09-03 DIAGNOSIS — L723 Sebaceous cyst: Secondary | ICD-10-CM | POA: Diagnosis not present

## 2014-09-03 DIAGNOSIS — S29012A Strain of muscle and tendon of back wall of thorax, initial encounter: Secondary | ICD-10-CM | POA: Diagnosis not present

## 2014-09-04 ENCOUNTER — Ambulatory Visit (INDEPENDENT_AMBULATORY_CARE_PROVIDER_SITE_OTHER): Payer: Medicare Other | Admitting: *Deleted

## 2014-09-04 DIAGNOSIS — Z5181 Encounter for therapeutic drug level monitoring: Secondary | ICD-10-CM | POA: Diagnosis not present

## 2014-09-04 DIAGNOSIS — I4891 Unspecified atrial fibrillation: Secondary | ICD-10-CM

## 2014-09-04 DIAGNOSIS — Z7901 Long term (current) use of anticoagulants: Secondary | ICD-10-CM | POA: Diagnosis not present

## 2014-09-04 LAB — POCT INR: INR: 1.8

## 2014-09-11 ENCOUNTER — Ambulatory Visit (INDEPENDENT_AMBULATORY_CARE_PROVIDER_SITE_OTHER): Payer: Medicare Other | Admitting: *Deleted

## 2014-09-11 DIAGNOSIS — I4891 Unspecified atrial fibrillation: Secondary | ICD-10-CM | POA: Diagnosis not present

## 2014-09-11 DIAGNOSIS — Z7901 Long term (current) use of anticoagulants: Secondary | ICD-10-CM

## 2014-09-11 DIAGNOSIS — Z5181 Encounter for therapeutic drug level monitoring: Secondary | ICD-10-CM

## 2014-09-11 LAB — POCT INR: INR: 1.9

## 2014-09-27 ENCOUNTER — Ambulatory Visit (INDEPENDENT_AMBULATORY_CARE_PROVIDER_SITE_OTHER): Payer: Medicare Other | Admitting: *Deleted

## 2014-09-27 DIAGNOSIS — I4891 Unspecified atrial fibrillation: Secondary | ICD-10-CM | POA: Diagnosis not present

## 2014-09-27 DIAGNOSIS — Z5181 Encounter for therapeutic drug level monitoring: Secondary | ICD-10-CM | POA: Diagnosis not present

## 2014-09-27 DIAGNOSIS — Z7901 Long term (current) use of anticoagulants: Secondary | ICD-10-CM

## 2014-09-27 LAB — POCT INR: INR: 2.4

## 2014-10-01 DIAGNOSIS — K219 Gastro-esophageal reflux disease without esophagitis: Secondary | ICD-10-CM | POA: Diagnosis not present

## 2014-10-01 DIAGNOSIS — I482 Chronic atrial fibrillation: Secondary | ICD-10-CM | POA: Diagnosis not present

## 2014-10-01 DIAGNOSIS — E782 Mixed hyperlipidemia: Secondary | ICD-10-CM | POA: Diagnosis not present

## 2014-10-01 DIAGNOSIS — I10 Essential (primary) hypertension: Secondary | ICD-10-CM | POA: Diagnosis not present

## 2014-10-01 DIAGNOSIS — E119 Type 2 diabetes mellitus without complications: Secondary | ICD-10-CM | POA: Diagnosis not present

## 2014-10-04 ENCOUNTER — Ambulatory Visit (INDEPENDENT_AMBULATORY_CARE_PROVIDER_SITE_OTHER): Payer: Medicare Other | Admitting: *Deleted

## 2014-10-04 DIAGNOSIS — Z7901 Long term (current) use of anticoagulants: Secondary | ICD-10-CM | POA: Diagnosis not present

## 2014-10-04 DIAGNOSIS — I4891 Unspecified atrial fibrillation: Secondary | ICD-10-CM | POA: Diagnosis not present

## 2014-10-04 DIAGNOSIS — Z5181 Encounter for therapeutic drug level monitoring: Secondary | ICD-10-CM | POA: Diagnosis not present

## 2014-10-04 LAB — POCT INR: INR: 2.1

## 2014-10-10 ENCOUNTER — Telehealth: Payer: Self-pay | Admitting: *Deleted

## 2014-10-10 NOTE — Telephone Encounter (Signed)
Told pt not to drink more than 1 beer 2-3 nights/week.  States she is going to drink more water with lemon.

## 2014-10-10 NOTE — Telephone Encounter (Signed)
Also, would like to know if she can drink beer.  She is trying to flush her Kidneys. Said that you can leave a message on her machine due to her leaving to go to therapy.

## 2014-10-10 NOTE — Telephone Encounter (Signed)
Sharon Little called wanting to know if she can drink cranberry juice while taking coumdin.

## 2014-10-15 DIAGNOSIS — K219 Gastro-esophageal reflux disease without esophagitis: Secondary | ICD-10-CM | POA: Diagnosis not present

## 2014-10-15 DIAGNOSIS — K7581 Nonalcoholic steatohepatitis (NASH): Secondary | ICD-10-CM | POA: Diagnosis not present

## 2014-10-15 DIAGNOSIS — E782 Mixed hyperlipidemia: Secondary | ICD-10-CM | POA: Diagnosis not present

## 2014-10-15 DIAGNOSIS — I482 Chronic atrial fibrillation: Secondary | ICD-10-CM | POA: Diagnosis not present

## 2014-10-15 DIAGNOSIS — M545 Low back pain: Secondary | ICD-10-CM | POA: Diagnosis not present

## 2014-10-15 DIAGNOSIS — I5032 Chronic diastolic (congestive) heart failure: Secondary | ICD-10-CM | POA: Diagnosis not present

## 2014-10-15 DIAGNOSIS — I10 Essential (primary) hypertension: Secondary | ICD-10-CM | POA: Diagnosis not present

## 2014-10-15 DIAGNOSIS — E119 Type 2 diabetes mellitus without complications: Secondary | ICD-10-CM | POA: Diagnosis not present

## 2014-10-15 DIAGNOSIS — J301 Allergic rhinitis due to pollen: Secondary | ICD-10-CM | POA: Diagnosis not present

## 2014-10-25 ENCOUNTER — Ambulatory Visit (INDEPENDENT_AMBULATORY_CARE_PROVIDER_SITE_OTHER): Payer: Medicare Other | Admitting: *Deleted

## 2014-10-25 DIAGNOSIS — I4891 Unspecified atrial fibrillation: Secondary | ICD-10-CM

## 2014-10-25 DIAGNOSIS — Z5181 Encounter for therapeutic drug level monitoring: Secondary | ICD-10-CM

## 2014-10-25 DIAGNOSIS — Z7901 Long term (current) use of anticoagulants: Secondary | ICD-10-CM

## 2014-10-25 LAB — POCT INR: INR: 2.6

## 2014-11-06 DIAGNOSIS — E119 Type 2 diabetes mellitus without complications: Secondary | ICD-10-CM | POA: Diagnosis not present

## 2014-11-22 ENCOUNTER — Ambulatory Visit (INDEPENDENT_AMBULATORY_CARE_PROVIDER_SITE_OTHER): Payer: Medicare Other | Admitting: *Deleted

## 2014-11-22 DIAGNOSIS — Z7901 Long term (current) use of anticoagulants: Secondary | ICD-10-CM | POA: Diagnosis not present

## 2014-11-22 DIAGNOSIS — Z5181 Encounter for therapeutic drug level monitoring: Secondary | ICD-10-CM | POA: Diagnosis not present

## 2014-11-22 DIAGNOSIS — I4891 Unspecified atrial fibrillation: Secondary | ICD-10-CM

## 2014-11-22 LAB — POCT INR: INR: 2.1

## 2014-12-02 DIAGNOSIS — E119 Type 2 diabetes mellitus without complications: Secondary | ICD-10-CM | POA: Diagnosis not present

## 2014-12-02 DIAGNOSIS — L03032 Cellulitis of left toe: Secondary | ICD-10-CM | POA: Diagnosis not present

## 2014-12-02 DIAGNOSIS — Z87891 Personal history of nicotine dependence: Secondary | ICD-10-CM | POA: Diagnosis not present

## 2014-12-02 DIAGNOSIS — M7989 Other specified soft tissue disorders: Secondary | ICD-10-CM | POA: Diagnosis not present

## 2014-12-04 DIAGNOSIS — L03032 Cellulitis of left toe: Secondary | ICD-10-CM | POA: Diagnosis not present

## 2014-12-24 DIAGNOSIS — L03031 Cellulitis of right toe: Secondary | ICD-10-CM | POA: Diagnosis not present

## 2014-12-24 DIAGNOSIS — L600 Ingrowing nail: Secondary | ICD-10-CM | POA: Diagnosis not present

## 2014-12-24 DIAGNOSIS — E1151 Type 2 diabetes mellitus with diabetic peripheral angiopathy without gangrene: Secondary | ICD-10-CM | POA: Diagnosis not present

## 2014-12-24 DIAGNOSIS — E1140 Type 2 diabetes mellitus with diabetic neuropathy, unspecified: Secondary | ICD-10-CM | POA: Diagnosis not present

## 2015-01-03 ENCOUNTER — Ambulatory Visit (INDEPENDENT_AMBULATORY_CARE_PROVIDER_SITE_OTHER): Payer: Medicare Other | Admitting: *Deleted

## 2015-01-03 DIAGNOSIS — Z7901 Long term (current) use of anticoagulants: Secondary | ICD-10-CM | POA: Diagnosis not present

## 2015-01-03 DIAGNOSIS — I4891 Unspecified atrial fibrillation: Secondary | ICD-10-CM | POA: Diagnosis not present

## 2015-01-03 DIAGNOSIS — Z5181 Encounter for therapeutic drug level monitoring: Secondary | ICD-10-CM | POA: Diagnosis not present

## 2015-01-03 LAB — POCT INR: INR: 2

## 2015-02-14 ENCOUNTER — Ambulatory Visit (INDEPENDENT_AMBULATORY_CARE_PROVIDER_SITE_OTHER): Payer: Medicare Other | Admitting: *Deleted

## 2015-02-14 DIAGNOSIS — Z5181 Encounter for therapeutic drug level monitoring: Secondary | ICD-10-CM | POA: Diagnosis not present

## 2015-02-14 DIAGNOSIS — Z7901 Long term (current) use of anticoagulants: Secondary | ICD-10-CM | POA: Diagnosis not present

## 2015-02-14 DIAGNOSIS — I4891 Unspecified atrial fibrillation: Secondary | ICD-10-CM | POA: Diagnosis not present

## 2015-02-14 LAB — POCT INR: INR: 2.6

## 2015-02-21 DIAGNOSIS — C44319 Basal cell carcinoma of skin of other parts of face: Secondary | ICD-10-CM | POA: Diagnosis not present

## 2015-02-22 DIAGNOSIS — E119 Type 2 diabetes mellitus without complications: Secondary | ICD-10-CM | POA: Diagnosis not present

## 2015-02-22 DIAGNOSIS — I10 Essential (primary) hypertension: Secondary | ICD-10-CM | POA: Diagnosis not present

## 2015-02-22 DIAGNOSIS — K219 Gastro-esophageal reflux disease without esophagitis: Secondary | ICD-10-CM | POA: Diagnosis not present

## 2015-02-22 DIAGNOSIS — E782 Mixed hyperlipidemia: Secondary | ICD-10-CM | POA: Diagnosis not present

## 2015-02-22 DIAGNOSIS — I482 Chronic atrial fibrillation: Secondary | ICD-10-CM | POA: Diagnosis not present

## 2015-02-22 DIAGNOSIS — C44310 Basal cell carcinoma of skin of unspecified parts of face: Secondary | ICD-10-CM | POA: Diagnosis not present

## 2015-03-01 DIAGNOSIS — E119 Type 2 diabetes mellitus without complications: Secondary | ICD-10-CM | POA: Diagnosis not present

## 2015-03-01 DIAGNOSIS — E1122 Type 2 diabetes mellitus with diabetic chronic kidney disease: Secondary | ICD-10-CM | POA: Diagnosis not present

## 2015-03-01 DIAGNOSIS — I482 Chronic atrial fibrillation: Secondary | ICD-10-CM | POA: Diagnosis not present

## 2015-03-01 DIAGNOSIS — K7581 Nonalcoholic steatohepatitis (NASH): Secondary | ICD-10-CM | POA: Diagnosis not present

## 2015-03-01 DIAGNOSIS — I5032 Chronic diastolic (congestive) heart failure: Secondary | ICD-10-CM | POA: Diagnosis not present

## 2015-03-01 DIAGNOSIS — Z23 Encounter for immunization: Secondary | ICD-10-CM | POA: Diagnosis not present

## 2015-03-01 DIAGNOSIS — K219 Gastro-esophageal reflux disease without esophagitis: Secondary | ICD-10-CM | POA: Diagnosis not present

## 2015-03-01 DIAGNOSIS — N182 Chronic kidney disease, stage 2 (mild): Secondary | ICD-10-CM | POA: Diagnosis not present

## 2015-03-11 DIAGNOSIS — E1151 Type 2 diabetes mellitus with diabetic peripheral angiopathy without gangrene: Secondary | ICD-10-CM | POA: Diagnosis not present

## 2015-03-11 DIAGNOSIS — E1140 Type 2 diabetes mellitus with diabetic neuropathy, unspecified: Secondary | ICD-10-CM | POA: Diagnosis not present

## 2015-03-13 DIAGNOSIS — M85852 Other specified disorders of bone density and structure, left thigh: Secondary | ICD-10-CM | POA: Diagnosis not present

## 2015-03-13 DIAGNOSIS — Z7901 Long term (current) use of anticoagulants: Secondary | ICD-10-CM | POA: Diagnosis not present

## 2015-03-13 DIAGNOSIS — E780 Pure hypercholesterolemia: Secondary | ICD-10-CM | POA: Diagnosis not present

## 2015-03-13 DIAGNOSIS — I10 Essential (primary) hypertension: Secondary | ICD-10-CM | POA: Diagnosis not present

## 2015-03-13 DIAGNOSIS — Z79899 Other long term (current) drug therapy: Secondary | ICD-10-CM | POA: Diagnosis not present

## 2015-03-13 DIAGNOSIS — M85851 Other specified disorders of bone density and structure, right thigh: Secondary | ICD-10-CM | POA: Diagnosis not present

## 2015-03-13 DIAGNOSIS — I4891 Unspecified atrial fibrillation: Secondary | ICD-10-CM | POA: Diagnosis not present

## 2015-03-13 DIAGNOSIS — Z780 Asymptomatic menopausal state: Secondary | ICD-10-CM | POA: Diagnosis not present

## 2015-03-13 DIAGNOSIS — M810 Age-related osteoporosis without current pathological fracture: Secondary | ICD-10-CM | POA: Diagnosis not present

## 2015-03-13 DIAGNOSIS — E119 Type 2 diabetes mellitus without complications: Secondary | ICD-10-CM | POA: Diagnosis not present

## 2015-03-13 DIAGNOSIS — I509 Heart failure, unspecified: Secondary | ICD-10-CM | POA: Diagnosis not present

## 2015-03-27 ENCOUNTER — Encounter: Payer: Self-pay | Admitting: Cardiology

## 2015-03-27 ENCOUNTER — Ambulatory Visit (INDEPENDENT_AMBULATORY_CARE_PROVIDER_SITE_OTHER): Payer: Medicare Other | Admitting: *Deleted

## 2015-03-27 ENCOUNTER — Ambulatory Visit (INDEPENDENT_AMBULATORY_CARE_PROVIDER_SITE_OTHER): Payer: Medicare Other | Admitting: Cardiology

## 2015-03-27 VITALS — BP 110/60 | HR 78 | Ht 67.0 in | Wt 171.8 lb

## 2015-03-27 DIAGNOSIS — Z5181 Encounter for therapeutic drug level monitoring: Secondary | ICD-10-CM | POA: Diagnosis not present

## 2015-03-27 DIAGNOSIS — I482 Chronic atrial fibrillation, unspecified: Secondary | ICD-10-CM

## 2015-03-27 DIAGNOSIS — I340 Nonrheumatic mitral (valve) insufficiency: Secondary | ICD-10-CM | POA: Diagnosis not present

## 2015-03-27 DIAGNOSIS — Z7901 Long term (current) use of anticoagulants: Secondary | ICD-10-CM

## 2015-03-27 DIAGNOSIS — I4891 Unspecified atrial fibrillation: Secondary | ICD-10-CM | POA: Diagnosis not present

## 2015-03-27 LAB — POCT INR: INR: 2

## 2015-03-27 NOTE — Patient Instructions (Signed)

## 2015-03-27 NOTE — Assessment & Plan Note (Signed)
The patient has chronic atrial fibrillation. The rate is controlled. No further workup.

## 2015-03-27 NOTE — Assessment & Plan Note (Signed)
Historically she had mild mitral regurgitation many years ago. She has a soft systolic murmur. She is clinically stable. I've chosen not to repeat her echo at this time.

## 2015-03-27 NOTE — Progress Notes (Signed)
Cardiology Office Note   Date:  03/27/2015   ID:  Sharon Little, DOB 1929-04-13, MRN [id number]  PCP:  Gar Ponto, MD  Cardiologist:  Dola Argyle, MD   Chief Complaint  Patient presents with  . Appointment    Follow-up atrial fibrillation      History of Present Illness: Sharon Little is a 81 y.o. female who presents today to follow up atrial fibrillation. She has back discomfort. Otherwise she's fine. She has no chest pain or shortness of breath. There's been no syncope or presyncope. She has chronic atrial fibrillation. She is on Coumadin.  Past Medical History  Diagnosis Date  . Dyslipidemia     mixed  . Edema     Chronic lower extremity edema  . MR (mitral regurgitation)     mild; echo 6/07 (echo also showed nml LV function)  . A-fib   . IVCD (intraventricular conduction defect)   . Abnormal CT scan, chest     2005-2007; stable liver mass; also hypodense liver lesions   . Ejection fraction     EF normal, echo, 2007  . Warfarin anticoagulation   . Chest discomfort     Nuclear, 2007, no ischemia, possible attenuation artifact  . Back pain     To see Dr. Carloyn Manner in September, 2013    Past Surgical History  Procedure Laterality Date  . Nuclear test  6/07    no ischemia, possible attenuation artifact     Patient Active Problem List   Diagnosis Date Noted  . Chronic atrial fibrillation 04/09/2014  . Encounter for therapeutic drug monitoring 08/11/2013  . Back pain   . Dyslipidemia   . Edema   . MR (mitral regurgitation)   . IVCD (intraventricular conduction defect)   . Abnormal CT scan, chest   . Ejection fraction   . Warfarin anticoagulation   . Chest discomfort       Current Outpatient Prescriptions  Medication Sig Dispense Refill  . acetaminophen (TYLENOL) 650 MG CR tablet Take 650 mg by mouth every 8 (eight) hours as needed.      . B Complex Vitamins (B COMPLEX-B12) TABS Take 1 tablet by mouth daily.     . Calcium Carbonate (CALCIUM 600 PO)  Take 1 tablet by mouth 2 (two) times daily.    . Cholecalciferol (VITAMIN D3) 1000 UNITS tablet Take 1,000 Units by mouth daily.      Marland Kitchen diltiazem (CARDIZEM CD) 240 MG 24 hr capsule Take 240 mg by mouth daily.     . DimenhyDRINATE (TRAVEL SICKNESS PO) Take 1 tablet by mouth as needed.    . furosemide (LASIX) 40 MG tablet Take 1 tablet by mouth Twice daily.    . hydrochlorothiazide 25 MG tablet Take 25 mg by mouth daily.      Marland Kitchen lovastatin (MEVACOR) 20 MG tablet Take 20 mg by mouth daily.      . metFORMIN (GLUCOPHAGE) 500 MG tablet Take by mouth 4 (four) times daily.    . Multiple Vitamin (MULTIVITAMIN) tablet Take 1 tablet by mouth daily.      Marland Kitchen warfarin (COUMADIN) 4 MG tablet Take by mouth as directed.      No current facility-administered medications for this visit.    Allergies:   Penicillins    Social History:  The patient  reports that she quit smoking about 46 years ago. Her smoking use included Cigarettes. She has a 48 pack-year smoking history. She has never used smokeless tobacco. She reports that  she does not drink alcohol or use illicit drugs.   Family History:  The patient's family history includes Colon cancer in her father; Heart disease in her brother, mother, and sister.    ROS:  Please see the history of present illness.     Patient denies fever, chills, headache, sweats, rash, change in vision, change in hearing, chest pain, cough, nausea or vomiting, urinary symptoms. All other systems are reviewed and are negative.   PHYSICAL EXAM: VS:  BP 110/60 mmHg  Pulse 78  Ht 5\' 7"  (1.702 m)  Wt 171 lb 12.8 oz (77.928 kg)  BMI 26.90 kg/m2  SpO2 95% , Patient is oriented to person time and place. Affect is normal. Head is atraumatic. Sclera and conjunctiva are normal. There is no jugular venous distention. Lungs are clear. Respiratory effort is nonlabored. Cardiac exam reveals S1 and S2. The abdomen is soft. There is no peripheral edema. There are no musculoskeletal deformities.  There are no skin rashes.   EKG:   EKG is done today and reviewed by me. She has atrial fib. The rate is controlled. She has a wide QRS complex. Intermittently she conducts with an interventricular conduction delay that is nonspecific. Intermittently she conducts with a left bundle branch block shaped QRS.   Recent Labs: No results found for requested labs within last 365 days.    Lipid Panel No results found for: CHOL, TRIG, HDL, CHOLHDL, VLDL, LDLCALC, LDLDIRECT    Wt Readings from Last 3 Encounters:  03/27/15 171 lb 12.8 oz (77.928 kg)  04/09/14 183 lb 4 oz (83.122 kg)  03/30/13 203 lb (92.08 kg)      Current medicines are reviewed       ASSESSMENT AND PLAN:

## 2015-03-27 NOTE — Assessment & Plan Note (Signed)
She is on long-term Coumadin for her atrial fibrillation. Her INR will be checked today.

## 2015-03-28 LAB — POCT INR
INR: 2
INR: 2

## 2015-04-23 DIAGNOSIS — H524 Presbyopia: Secondary | ICD-10-CM | POA: Diagnosis not present

## 2015-04-23 DIAGNOSIS — E119 Type 2 diabetes mellitus without complications: Secondary | ICD-10-CM | POA: Diagnosis not present

## 2015-05-06 DIAGNOSIS — H1032 Unspecified acute conjunctivitis, left eye: Secondary | ICD-10-CM | POA: Diagnosis not present

## 2015-05-09 ENCOUNTER — Ambulatory Visit (INDEPENDENT_AMBULATORY_CARE_PROVIDER_SITE_OTHER): Payer: Medicare Other | Admitting: *Deleted

## 2015-05-09 DIAGNOSIS — Z5181 Encounter for therapeutic drug level monitoring: Secondary | ICD-10-CM

## 2015-05-09 DIAGNOSIS — I4891 Unspecified atrial fibrillation: Secondary | ICD-10-CM | POA: Diagnosis not present

## 2015-05-09 DIAGNOSIS — Z7901 Long term (current) use of anticoagulants: Secondary | ICD-10-CM

## 2015-05-09 LAB — POCT INR: INR: 2.2

## 2015-05-20 DIAGNOSIS — E1140 Type 2 diabetes mellitus with diabetic neuropathy, unspecified: Secondary | ICD-10-CM | POA: Diagnosis not present

## 2015-05-20 DIAGNOSIS — E1151 Type 2 diabetes mellitus with diabetic peripheral angiopathy without gangrene: Secondary | ICD-10-CM | POA: Diagnosis not present

## 2015-05-29 DIAGNOSIS — B0229 Other postherpetic nervous system involvement: Secondary | ICD-10-CM | POA: Diagnosis not present

## 2015-06-03 DIAGNOSIS — Z1231 Encounter for screening mammogram for malignant neoplasm of breast: Secondary | ICD-10-CM | POA: Diagnosis not present

## 2015-06-20 ENCOUNTER — Ambulatory Visit (INDEPENDENT_AMBULATORY_CARE_PROVIDER_SITE_OTHER): Payer: Medicare Other | Admitting: *Deleted

## 2015-06-20 DIAGNOSIS — Z7901 Long term (current) use of anticoagulants: Secondary | ICD-10-CM | POA: Diagnosis not present

## 2015-06-20 DIAGNOSIS — Z5181 Encounter for therapeutic drug level monitoring: Secondary | ICD-10-CM

## 2015-06-20 DIAGNOSIS — I4891 Unspecified atrial fibrillation: Secondary | ICD-10-CM

## 2015-06-20 LAB — POCT INR: INR: 2.8

## 2015-07-11 DIAGNOSIS — N182 Chronic kidney disease, stage 2 (mild): Secondary | ICD-10-CM | POA: Diagnosis not present

## 2015-07-11 DIAGNOSIS — E119 Type 2 diabetes mellitus without complications: Secondary | ICD-10-CM | POA: Diagnosis not present

## 2015-07-11 DIAGNOSIS — K219 Gastro-esophageal reflux disease without esophagitis: Secondary | ICD-10-CM | POA: Diagnosis not present

## 2015-07-11 DIAGNOSIS — I10 Essential (primary) hypertension: Secondary | ICD-10-CM | POA: Diagnosis not present

## 2015-07-11 DIAGNOSIS — E782 Mixed hyperlipidemia: Secondary | ICD-10-CM | POA: Diagnosis not present

## 2015-07-15 DIAGNOSIS — N182 Chronic kidney disease, stage 2 (mild): Secondary | ICD-10-CM | POA: Diagnosis not present

## 2015-07-15 DIAGNOSIS — I482 Chronic atrial fibrillation: Secondary | ICD-10-CM | POA: Diagnosis not present

## 2015-07-15 DIAGNOSIS — I5032 Chronic diastolic (congestive) heart failure: Secondary | ICD-10-CM | POA: Diagnosis not present

## 2015-07-15 DIAGNOSIS — J301 Allergic rhinitis due to pollen: Secondary | ICD-10-CM | POA: Diagnosis not present

## 2015-07-15 DIAGNOSIS — K7581 Nonalcoholic steatohepatitis (NASH): Secondary | ICD-10-CM | POA: Diagnosis not present

## 2015-07-15 DIAGNOSIS — I10 Essential (primary) hypertension: Secondary | ICD-10-CM | POA: Diagnosis not present

## 2015-07-15 DIAGNOSIS — E1122 Type 2 diabetes mellitus with diabetic chronic kidney disease: Secondary | ICD-10-CM | POA: Diagnosis not present

## 2015-07-15 DIAGNOSIS — K219 Gastro-esophageal reflux disease without esophagitis: Secondary | ICD-10-CM | POA: Diagnosis not present

## 2015-07-15 DIAGNOSIS — B0229 Other postherpetic nervous system involvement: Secondary | ICD-10-CM | POA: Diagnosis not present

## 2015-07-15 DIAGNOSIS — E782 Mixed hyperlipidemia: Secondary | ICD-10-CM | POA: Diagnosis not present

## 2015-08-01 ENCOUNTER — Ambulatory Visit (INDEPENDENT_AMBULATORY_CARE_PROVIDER_SITE_OTHER): Payer: Medicare Other | Admitting: *Deleted

## 2015-08-01 DIAGNOSIS — I4891 Unspecified atrial fibrillation: Secondary | ICD-10-CM

## 2015-08-01 DIAGNOSIS — Z5181 Encounter for therapeutic drug level monitoring: Secondary | ICD-10-CM

## 2015-08-01 DIAGNOSIS — Z7901 Long term (current) use of anticoagulants: Secondary | ICD-10-CM | POA: Diagnosis not present

## 2015-08-01 LAB — POCT INR: INR: 2.1

## 2015-08-05 DIAGNOSIS — E1151 Type 2 diabetes mellitus with diabetic peripheral angiopathy without gangrene: Secondary | ICD-10-CM | POA: Diagnosis not present

## 2015-08-05 DIAGNOSIS — E1140 Type 2 diabetes mellitus with diabetic neuropathy, unspecified: Secondary | ICD-10-CM | POA: Diagnosis not present

## 2015-08-12 DIAGNOSIS — M545 Low back pain: Secondary | ICD-10-CM | POA: Diagnosis not present

## 2015-08-12 DIAGNOSIS — B0229 Other postherpetic nervous system involvement: Secondary | ICD-10-CM | POA: Diagnosis not present

## 2015-09-12 ENCOUNTER — Ambulatory Visit (INDEPENDENT_AMBULATORY_CARE_PROVIDER_SITE_OTHER): Payer: Medicare Other | Admitting: *Deleted

## 2015-09-12 DIAGNOSIS — Z5181 Encounter for therapeutic drug level monitoring: Secondary | ICD-10-CM | POA: Diagnosis not present

## 2015-09-12 LAB — POCT INR: INR: 2.5

## 2015-09-16 ENCOUNTER — Telehealth: Payer: Self-pay | Admitting: *Deleted

## 2015-09-16 NOTE — Telephone Encounter (Signed)
Reassured pt.  Continue current coumadin dose.

## 2015-09-16 NOTE — Telephone Encounter (Signed)
Patient saw thought she heard Sharon Little say that her INR was 3.5 last week when in fact it was 2.5. The patient went home and ate a "small piece of broccoli" because she thought it was high and then she saw on her paperwork that it actually wasn't high. Patient really wants to know what Sharon Little thinks about this - did that broccoli mess anything up? Should the patient continue her medication the same way?  Sharon Little said she has to run some errands shortly but asked that Sharon Little leave a message on the machine if she isn't home.

## 2015-10-22 ENCOUNTER — Ambulatory Visit (INDEPENDENT_AMBULATORY_CARE_PROVIDER_SITE_OTHER): Payer: Medicare Other | Admitting: *Deleted

## 2015-10-22 DIAGNOSIS — Z5181 Encounter for therapeutic drug level monitoring: Secondary | ICD-10-CM

## 2015-10-22 LAB — POCT INR: INR: 2.4

## 2015-10-28 DIAGNOSIS — E1151 Type 2 diabetes mellitus with diabetic peripheral angiopathy without gangrene: Secondary | ICD-10-CM | POA: Diagnosis not present

## 2015-10-28 DIAGNOSIS — E1140 Type 2 diabetes mellitus with diabetic neuropathy, unspecified: Secondary | ICD-10-CM | POA: Diagnosis not present

## 2015-11-11 DIAGNOSIS — E1122 Type 2 diabetes mellitus with diabetic chronic kidney disease: Secondary | ICD-10-CM | POA: Diagnosis not present

## 2015-11-11 DIAGNOSIS — K219 Gastro-esophageal reflux disease without esophagitis: Secondary | ICD-10-CM | POA: Diagnosis not present

## 2015-11-11 DIAGNOSIS — I10 Essential (primary) hypertension: Secondary | ICD-10-CM | POA: Diagnosis not present

## 2015-11-11 DIAGNOSIS — E782 Mixed hyperlipidemia: Secondary | ICD-10-CM | POA: Diagnosis not present

## 2015-11-11 DIAGNOSIS — N182 Chronic kidney disease, stage 2 (mild): Secondary | ICD-10-CM | POA: Diagnosis not present

## 2015-11-13 DIAGNOSIS — E782 Mixed hyperlipidemia: Secondary | ICD-10-CM | POA: Diagnosis not present

## 2015-11-13 DIAGNOSIS — E1122 Type 2 diabetes mellitus with diabetic chronic kidney disease: Secondary | ICD-10-CM | POA: Diagnosis not present

## 2015-11-13 DIAGNOSIS — K7581 Nonalcoholic steatohepatitis (NASH): Secondary | ICD-10-CM | POA: Diagnosis not present

## 2015-11-13 DIAGNOSIS — J301 Allergic rhinitis due to pollen: Secondary | ICD-10-CM | POA: Diagnosis not present

## 2015-11-13 DIAGNOSIS — I5032 Chronic diastolic (congestive) heart failure: Secondary | ICD-10-CM | POA: Diagnosis not present

## 2015-11-13 DIAGNOSIS — I482 Chronic atrial fibrillation: Secondary | ICD-10-CM | POA: Diagnosis not present

## 2015-11-13 DIAGNOSIS — K219 Gastro-esophageal reflux disease without esophagitis: Secondary | ICD-10-CM | POA: Diagnosis not present

## 2015-11-13 DIAGNOSIS — I10 Essential (primary) hypertension: Secondary | ICD-10-CM | POA: Diagnosis not present

## 2015-12-03 ENCOUNTER — Ambulatory Visit (INDEPENDENT_AMBULATORY_CARE_PROVIDER_SITE_OTHER): Payer: Medicare Other | Admitting: *Deleted

## 2015-12-03 DIAGNOSIS — Z5181 Encounter for therapeutic drug level monitoring: Secondary | ICD-10-CM | POA: Diagnosis not present

## 2015-12-03 LAB — POCT INR: INR: 2.3

## 2016-01-13 DIAGNOSIS — E1140 Type 2 diabetes mellitus with diabetic neuropathy, unspecified: Secondary | ICD-10-CM | POA: Diagnosis not present

## 2016-01-13 DIAGNOSIS — E1151 Type 2 diabetes mellitus with diabetic peripheral angiopathy without gangrene: Secondary | ICD-10-CM | POA: Diagnosis not present

## 2016-01-14 ENCOUNTER — Ambulatory Visit (INDEPENDENT_AMBULATORY_CARE_PROVIDER_SITE_OTHER): Payer: Medicare Other | Admitting: *Deleted

## 2016-01-14 DIAGNOSIS — Z5181 Encounter for therapeutic drug level monitoring: Secondary | ICD-10-CM

## 2016-01-14 LAB — POCT INR: INR: 2.2

## 2016-02-25 ENCOUNTER — Ambulatory Visit (INDEPENDENT_AMBULATORY_CARE_PROVIDER_SITE_OTHER): Payer: Medicare Other | Admitting: *Deleted

## 2016-02-25 DIAGNOSIS — Z5181 Encounter for therapeutic drug level monitoring: Secondary | ICD-10-CM

## 2016-02-25 LAB — POCT INR: INR: 2

## 2016-03-23 ENCOUNTER — Encounter: Payer: Self-pay | Admitting: *Deleted

## 2016-03-23 DIAGNOSIS — E1151 Type 2 diabetes mellitus with diabetic peripheral angiopathy without gangrene: Secondary | ICD-10-CM | POA: Diagnosis not present

## 2016-03-23 DIAGNOSIS — E782 Mixed hyperlipidemia: Secondary | ICD-10-CM | POA: Diagnosis not present

## 2016-03-23 DIAGNOSIS — I482 Chronic atrial fibrillation: Secondary | ICD-10-CM | POA: Diagnosis not present

## 2016-03-23 DIAGNOSIS — E1122 Type 2 diabetes mellitus with diabetic chronic kidney disease: Secondary | ICD-10-CM | POA: Diagnosis not present

## 2016-03-23 DIAGNOSIS — I340 Nonrheumatic mitral (valve) insufficiency: Secondary | ICD-10-CM | POA: Diagnosis not present

## 2016-03-23 DIAGNOSIS — I10 Essential (primary) hypertension: Secondary | ICD-10-CM | POA: Diagnosis not present

## 2016-03-23 DIAGNOSIS — K219 Gastro-esophageal reflux disease without esophagitis: Secondary | ICD-10-CM | POA: Diagnosis not present

## 2016-03-23 DIAGNOSIS — E1140 Type 2 diabetes mellitus with diabetic neuropathy, unspecified: Secondary | ICD-10-CM | POA: Diagnosis not present

## 2016-03-24 ENCOUNTER — Encounter: Payer: Self-pay | Admitting: Cardiology

## 2016-03-24 ENCOUNTER — Ambulatory Visit (INDEPENDENT_AMBULATORY_CARE_PROVIDER_SITE_OTHER): Payer: Medicare Other | Admitting: Cardiology

## 2016-03-24 VITALS — BP 110/66 | HR 51 | Ht 67.0 in | Wt 178.8 lb

## 2016-03-24 DIAGNOSIS — R011 Cardiac murmur, unspecified: Secondary | ICD-10-CM | POA: Diagnosis not present

## 2016-03-24 DIAGNOSIS — I482 Chronic atrial fibrillation, unspecified: Secondary | ICD-10-CM

## 2016-03-24 DIAGNOSIS — R0609 Other forms of dyspnea: Secondary | ICD-10-CM | POA: Diagnosis not present

## 2016-03-24 DIAGNOSIS — E785 Hyperlipidemia, unspecified: Secondary | ICD-10-CM | POA: Diagnosis not present

## 2016-03-24 NOTE — Progress Notes (Signed)
Cardiology Office Note  Date: 03/24/2016   ID: LAYLONNIE NOLLE, DOB Jun 02, 1929, MRN [id number]  PCP: Gar Ponto, MD  Primary Cardiologist: Rozann Lesches, MD   Chief Complaint  Patient presents with  . Atrial Fibrillation    History of Present Illness: LYNISE LAVANWAY is an 82 y.o. female former patient of Dr. Ron Parker, now establishing follow-up with me. This is our first meeting. I reviewed her records and updated the chart. She reports chronic dyspnea on exertion and also atypical "sharp" chest pain that occurs occasionally. She does not report any palpitations. She exercises at a local senior center using a stationary bicycle. Does have some limitations related to her shortness of breath. She recalls being told at one point that she has "heart failure."  She continues on Coumadin with follow-up in the anticoagulation clinic. She has not had any bleeding problems.  I reviewed her medications which are outlined below. She is on Cardizem CD 240 mg daily for heart rate control. ECG today shows atrial fibrillation at 69 bpm with left bundle branch block and PVCs.  Past Medical History:  Diagnosis Date  . Chronic atrial fibrillation ([place])   . Chronic back pain   . Hyperlipidemia   . Leg edema     History reviewed. No pertinent surgical history.  Current Outpatient Prescriptions  Medication Sig Dispense Refill  . acetaminophen (TYLENOL) 650 MG CR tablet Take 650 mg by mouth every 8 (eight) hours as needed.      . B Complex Vitamins (B COMPLEX-B12) TABS Take 1 tablet by mouth daily.     . Cholecalciferol (VITAMIN D3) 1000 UNITS tablet Take 1,000 Units by mouth daily.      Marland Kitchen diltiazem (CARDIZEM CD) 240 MG 24 hr capsule Take 240 mg by mouth daily.     . DimenhyDRINATE (TRAVEL SICKNESS PO) Take 1 tablet by mouth as needed.    . furosemide (LASIX) 40 MG tablet Take 1 tablet by mouth Twice daily.    Marland Kitchen gabapentin (NEURONTIN) 600 MG tablet Take 600 mg by mouth 3 (three) times daily.    .  hydrochlorothiazide 25 MG tablet Take 12.5 mg by mouth daily.     Marland Kitchen lovastatin (MEVACOR) 20 MG tablet Take 20 mg by mouth daily.      . metFORMIN (GLUCOPHAGE) 500 MG tablet Take 1,000 mg by mouth 2 (two) times daily with a meal.     . Multiple Vitamin (MULTIVITAMIN) tablet Take 1 tablet by mouth daily.      Marland Kitchen warfarin (COUMADIN) 4 MG tablet Take by mouth as directed.      No current facility-administered medications for this visit.    Allergies:  Penicillins   Social History: The patient  reports that she quit smoking about 47 years ago. Her smoking use included Cigarettes. She has a 48.00 pack-year smoking history. She has never used smokeless tobacco. She reports that she does not drink alcohol or use drugs.   ROS:  Please see the history of present illness. Otherwise, complete review of systems is positive for back pain.  All other systems are reviewed and negative.   Physical Exam: VS:  BP 110/66   Pulse (!) 51   Ht 5\' 7"  (1.702 m)   Wt 178 lb 12.8 oz (81.1 kg)   SpO2 94%   BMI 28.00 kg/m , BMI Body mass index is 28 kg/m.  Wt Readings from Last 3 Encounters:  03/24/16 178 lb 12.8 oz (81.1 kg)  03/27/15 171 lb  12.8 oz (77.9 kg)  04/09/14 183 lb 4 oz (83.1 kg)    General: Elderly woman, appears comfortable at rest. HEENT: Conjunctiva and lids normal, oropharynx clear. Neck: Supple, no elevated JVP or carotid bruits, no thyromegaly. Lungs: Clear to auscultation, nonlabored breathing at rest. Cardiac: Irregularly irregular no S3, [id number] systolic murmur, no pericardial rub. Abdomen: Soft, nontender,bowel sounds present, no guarding or rebound. Extremities: Trace ankle edema, distal pulses 2+. Skin: Warm and dry. Musculoskeletal: No kyphosis. Neuropsychiatric: Alert and oriented x3, affect grossly appropriate.  ECG: I personally reviewed the tracing from 03/27/2015 which showed atrial fibrillation with left bundle branch block and PVCs.  Assessment and Plan:  1. Chronic atrial  fibrillation with left bundle branch block. She is reporting exertional dyspnea which could potentially be related to chronotropic incompetence, but it seems that she has done well on current dose of Cardizem CD. Reluctant to change medications now. We will obtain an echocardiogram to better quantify LVEF and valvular status to see if this could be related as well, heart murmur noted on examination. She otherwise continues on Coumadin for stroke prophylaxis.  2. Chronic leg edema, fairly mild at this time. She continues on Lasix.  3. Hyperlipidemia, on Mevacor.  Current medicines were reviewed with the patient today.   Orders Placed This Encounter  Procedures  . EKG 12-Lead  . ECHOCARDIOGRAM COMPLETE    Disposition: Follow-up with me in one year, sooner if needed.  Signed, Satira Sark, MD, [facility] 03/24/2016 1:45 PM    [facility] [place], [place], [place] [address] Phone: [phone]; Fax: [phone]

## 2016-03-24 NOTE — Patient Instructions (Signed)
Your physician wants you to follow-up in: 1 YEAR WITH DR MCDOWELL You will receive a reminder letter in the mail two months in advance. If you don't receive a letter, please call our office to schedule the follow-up appointment.  Your physician recommends that you continue on your current medications as directed. Please refer to the Current Medication list given to you today.  Your physician has requested that you have an echocardiogram. Echocardiography is a painless test that uses sound waves to create images of your heart. It provides your doctor with information about the size and shape of your heart and how well your heart's chambers and valves are working. This procedure takes approximately one hour. There are no restrictions for this procedure.  Thank you for choosing [facility]!!    

## 2016-03-26 DIAGNOSIS — I482 Chronic atrial fibrillation: Secondary | ICD-10-CM | POA: Diagnosis not present

## 2016-03-26 DIAGNOSIS — I10 Essential (primary) hypertension: Secondary | ICD-10-CM | POA: Diagnosis not present

## 2016-03-26 DIAGNOSIS — Z6829 Body mass index (BMI) 29.0-29.9, adult: Secondary | ICD-10-CM | POA: Diagnosis not present

## 2016-03-26 DIAGNOSIS — K7581 Nonalcoholic steatohepatitis (NASH): Secondary | ICD-10-CM | POA: Diagnosis not present

## 2016-03-26 DIAGNOSIS — B0229 Other postherpetic nervous system involvement: Secondary | ICD-10-CM | POA: Diagnosis not present

## 2016-03-26 DIAGNOSIS — K219 Gastro-esophageal reflux disease without esophagitis: Secondary | ICD-10-CM | POA: Diagnosis not present

## 2016-03-26 DIAGNOSIS — N182 Chronic kidney disease, stage 2 (mild): Secondary | ICD-10-CM | POA: Diagnosis not present

## 2016-03-26 DIAGNOSIS — Z23 Encounter for immunization: Secondary | ICD-10-CM | POA: Diagnosis not present

## 2016-03-26 DIAGNOSIS — I5032 Chronic diastolic (congestive) heart failure: Secondary | ICD-10-CM | POA: Diagnosis not present

## 2016-03-26 DIAGNOSIS — I340 Nonrheumatic mitral (valve) insufficiency: Secondary | ICD-10-CM | POA: Diagnosis not present

## 2016-03-26 DIAGNOSIS — J301 Allergic rhinitis due to pollen: Secondary | ICD-10-CM | POA: Diagnosis not present

## 2016-03-26 DIAGNOSIS — E782 Mixed hyperlipidemia: Secondary | ICD-10-CM | POA: Diagnosis not present

## 2016-03-26 DIAGNOSIS — E1122 Type 2 diabetes mellitus with diabetic chronic kidney disease: Secondary | ICD-10-CM | POA: Diagnosis not present

## 2016-04-02 DIAGNOSIS — E785 Hyperlipidemia, unspecified: Secondary | ICD-10-CM | POA: Diagnosis present

## 2016-04-02 DIAGNOSIS — Z79899 Other long term (current) drug therapy: Secondary | ICD-10-CM | POA: Diagnosis not present

## 2016-04-02 DIAGNOSIS — Z888 Allergy status to other drugs, medicaments and biological substances status: Secondary | ICD-10-CM | POA: Diagnosis not present

## 2016-04-02 DIAGNOSIS — I5032 Chronic diastolic (congestive) heart failure: Secondary | ICD-10-CM | POA: Diagnosis not present

## 2016-04-02 DIAGNOSIS — Z880 Allergy status to penicillin: Secondary | ICD-10-CM | POA: Diagnosis not present

## 2016-04-02 DIAGNOSIS — R0682 Tachypnea, not elsewhere classified: Secondary | ICD-10-CM | POA: Diagnosis not present

## 2016-04-02 DIAGNOSIS — B0229 Other postherpetic nervous system involvement: Secondary | ICD-10-CM | POA: Diagnosis not present

## 2016-04-02 DIAGNOSIS — I482 Chronic atrial fibrillation: Secondary | ICD-10-CM | POA: Diagnosis not present

## 2016-04-02 DIAGNOSIS — I214 Non-ST elevation (NSTEMI) myocardial infarction: Secondary | ICD-10-CM | POA: Diagnosis present

## 2016-04-02 DIAGNOSIS — Z7984 Long term (current) use of oral hypoglycemic drugs: Secondary | ICD-10-CM | POA: Diagnosis not present

## 2016-04-02 DIAGNOSIS — I509 Heart failure, unspecified: Secondary | ICD-10-CM | POA: Diagnosis not present

## 2016-04-02 DIAGNOSIS — I5033 Acute on chronic diastolic (congestive) heart failure: Secondary | ICD-10-CM | POA: Diagnosis not present

## 2016-04-02 DIAGNOSIS — Z7901 Long term (current) use of anticoagulants: Secondary | ICD-10-CM | POA: Diagnosis not present

## 2016-04-02 DIAGNOSIS — K219 Gastro-esophageal reflux disease without esophagitis: Secondary | ICD-10-CM | POA: Diagnosis present

## 2016-04-02 DIAGNOSIS — M810 Age-related osteoporosis without current pathological fracture: Secondary | ICD-10-CM | POA: Diagnosis present

## 2016-04-02 DIAGNOSIS — Z8249 Family history of ischemic heart disease and other diseases of the circulatory system: Secondary | ICD-10-CM | POA: Diagnosis not present

## 2016-04-02 DIAGNOSIS — J9601 Acute respiratory failure with hypoxia: Secondary | ICD-10-CM | POA: Diagnosis not present

## 2016-04-02 DIAGNOSIS — I447 Left bundle-branch block, unspecified: Secondary | ICD-10-CM | POA: Diagnosis present

## 2016-04-02 DIAGNOSIS — I110 Hypertensive heart disease with heart failure: Secondary | ICD-10-CM | POA: Diagnosis present

## 2016-04-02 DIAGNOSIS — E119 Type 2 diabetes mellitus without complications: Secondary | ICD-10-CM | POA: Diagnosis present

## 2016-04-02 DIAGNOSIS — Z87891 Personal history of nicotine dependence: Secondary | ICD-10-CM | POA: Diagnosis not present

## 2016-04-02 DIAGNOSIS — K5909 Other constipation: Secondary | ICD-10-CM | POA: Diagnosis present

## 2016-04-02 DIAGNOSIS — J9602 Acute respiratory failure with hypercapnia: Secondary | ICD-10-CM | POA: Diagnosis not present

## 2016-04-02 DIAGNOSIS — M1990 Unspecified osteoarthritis, unspecified site: Secondary | ICD-10-CM | POA: Diagnosis present

## 2016-04-02 DIAGNOSIS — I249 Acute ischemic heart disease, unspecified: Secondary | ICD-10-CM | POA: Diagnosis not present

## 2016-04-02 DIAGNOSIS — Z809 Family history of malignant neoplasm, unspecified: Secondary | ICD-10-CM | POA: Diagnosis not present

## 2016-04-03 ENCOUNTER — Inpatient Hospital Stay (HOSPITAL_COMMUNITY)
Admission: AD | Admit: 2016-04-03 | Discharge: 2016-04-07 | DRG: 287 | Disposition: A | Discharge status: Home / Self Care | Payer: Medicare Other | Source: Other Acute Inpatient Hospital | Attending: Internal Medicine | Admitting: Internal Medicine

## 2016-04-03 DIAGNOSIS — Z7984 Long term (current) use of oral hypoglycemic drugs: Secondary | ICD-10-CM | POA: Diagnosis not present

## 2016-04-03 DIAGNOSIS — I493 Ventricular premature depolarization: Secondary | ICD-10-CM | POA: Diagnosis not present

## 2016-04-03 DIAGNOSIS — I482 Chronic atrial fibrillation, unspecified: Secondary | ICD-10-CM | POA: Diagnosis present

## 2016-04-03 DIAGNOSIS — R748 Abnormal levels of other serum enzymes: Secondary | ICD-10-CM

## 2016-04-03 DIAGNOSIS — Z7901 Long term (current) use of anticoagulants: Secondary | ICD-10-CM

## 2016-04-03 DIAGNOSIS — M549 Dorsalgia, unspecified: Secondary | ICD-10-CM | POA: Diagnosis present

## 2016-04-03 DIAGNOSIS — I249 Acute ischemic heart disease, unspecified: Secondary | ICD-10-CM | POA: Diagnosis not present

## 2016-04-03 DIAGNOSIS — I5033 Acute on chronic diastolic (congestive) heart failure: Principal | ICD-10-CM

## 2016-04-03 DIAGNOSIS — Z880 Allergy status to penicillin: Secondary | ICD-10-CM | POA: Diagnosis not present

## 2016-04-03 DIAGNOSIS — E785 Hyperlipidemia, unspecified: Secondary | ICD-10-CM | POA: Diagnosis present

## 2016-04-03 DIAGNOSIS — I350 Nonrheumatic aortic (valve) stenosis: Secondary | ICD-10-CM

## 2016-04-03 DIAGNOSIS — Z87891 Personal history of nicotine dependence: Secondary | ICD-10-CM

## 2016-04-03 DIAGNOSIS — I447 Left bundle-branch block, unspecified: Secondary | ICD-10-CM | POA: Diagnosis present

## 2016-04-03 DIAGNOSIS — I080 Rheumatic disorders of both mitral and aortic valves: Secondary | ICD-10-CM | POA: Diagnosis present

## 2016-04-03 DIAGNOSIS — I272 Pulmonary hypertension, unspecified: Secondary | ICD-10-CM | POA: Diagnosis present

## 2016-04-03 DIAGNOSIS — I340 Nonrheumatic mitral (valve) insufficiency: Secondary | ICD-10-CM | POA: Diagnosis present

## 2016-04-03 DIAGNOSIS — Z8249 Family history of ischemic heart disease and other diseases of the circulatory system: Secondary | ICD-10-CM | POA: Diagnosis not present

## 2016-04-03 DIAGNOSIS — I2510 Atherosclerotic heart disease of native coronary artery without angina pectoris: Secondary | ICD-10-CM | POA: Diagnosis not present

## 2016-04-03 DIAGNOSIS — E119 Type 2 diabetes mellitus without complications: Secondary | ICD-10-CM | POA: Diagnosis present

## 2016-04-03 DIAGNOSIS — I5041 Acute combined systolic (congestive) and diastolic (congestive) heart failure: Secondary | ICD-10-CM | POA: Diagnosis present

## 2016-04-03 DIAGNOSIS — I2584 Coronary atherosclerosis due to calcified coronary lesion: Secondary | ICD-10-CM | POA: Diagnosis present

## 2016-04-03 DIAGNOSIS — B0229 Other postherpetic nervous system involvement: Secondary | ICD-10-CM | POA: Diagnosis present

## 2016-04-03 DIAGNOSIS — R011 Cardiac murmur, unspecified: Secondary | ICD-10-CM | POA: Diagnosis present

## 2016-04-03 DIAGNOSIS — Z800 Family history of malignant neoplasm of digestive organs: Secondary | ICD-10-CM

## 2016-04-03 DIAGNOSIS — R778 Other specified abnormalities of plasma proteins: Secondary | ICD-10-CM

## 2016-04-03 DIAGNOSIS — R7989 Other specified abnormal findings of blood chemistry: Secondary | ICD-10-CM

## 2016-04-03 LAB — PROTIME-INR
INR: 2.43
Prothrombin Time: 26.9 seconds — ABNORMAL HIGH (ref 11.4–15.2)

## 2016-04-03 LAB — MRSA PCR SCREENING: MRSA by PCR: NEGATIVE

## 2016-04-03 LAB — TROPONIN I: Troponin I: 0.92 ng/mL (ref <0.03)

## 2016-04-03 MED ORDER — GABAPENTIN 600 MG PO TABS
600.0000 mg | ORAL_TABLET | Freq: Three times a day (TID) | ORAL | Status: DC
  Administered 2016-04-03 – 2016-04-04 (×2): 600 mg via ORAL
  Filled 2016-04-03 (×2): qty 1

## 2016-04-03 MED ORDER — FUROSEMIDE 10 MG/ML IJ SOLN
40.0000 mg | Freq: Two times a day (BID) | INTRAMUSCULAR | Status: AC
  Administered 2016-04-03 – 2016-04-04 (×3): 40 mg via INTRAVENOUS
  Filled 2016-04-03 (×3): qty 4

## 2016-04-03 MED ORDER — ASPIRIN EC 81 MG PO TBEC
81.0000 mg | DELAYED_RELEASE_TABLET | Freq: Every day | ORAL | Status: DC
  Administered 2016-04-04 – 2016-04-07 (×3): 81 mg via ORAL
  Filled 2016-04-03 (×4): qty 1

## 2016-04-03 MED ORDER — ACETAMINOPHEN 325 MG PO TABS
650.0000 mg | ORAL_TABLET | ORAL | Status: DC | PRN
  Administered 2016-04-04: 650 mg via ORAL
  Filled 2016-04-03: qty 2

## 2016-04-03 MED ORDER — INSULIN ASPART 100 UNIT/ML ~~LOC~~ SOLN
0.0000 [IU] | Freq: Three times a day (TID) | SUBCUTANEOUS | Status: DC
  Administered 2016-04-04 – 2016-04-07 (×8): 2 [IU] via SUBCUTANEOUS

## 2016-04-03 MED ORDER — DILTIAZEM HCL ER COATED BEADS 240 MG PO CP24
240.0000 mg | ORAL_CAPSULE | Freq: Every day | ORAL | Status: DC
Start: 2016-04-04 — End: 2016-04-07
  Administered 2016-04-04 – 2016-04-07 (×4): 240 mg via ORAL
  Filled 2016-04-03 (×4): qty 1

## 2016-04-03 MED ORDER — ONDANSETRON HCL 4 MG/2ML IJ SOLN: 4.0000 mg | Freq: Four times a day (QID) | INTRAMUSCULAR | Status: DC | PRN

## 2016-04-03 MED ORDER — NITROGLYCERIN 0.4 MG SL SUBL: 0.4000 mg | SUBLINGUAL_TABLET | SUBLINGUAL | Status: DC | PRN

## 2016-04-03 MED ORDER — PRAVASTATIN SODIUM 40 MG PO TABS
20.0000 mg | ORAL_TABLET | Freq: Every day | ORAL | Status: DC
  Administered 2016-04-04 – 2016-04-06 (×3): 20 mg via ORAL
  Filled 2016-04-03 (×4): qty 1

## 2016-04-03 NOTE — H&P (Signed)
History & Physical    Patient ID: TRULY WADHAMS MRN: [id number], DOB/AGE: 1929/05/16   Admit date: 04/03/2016   Primary Physician: Gar Ponto, MD Primary Cardiologist: Previously Dr. Ron Parker - Now Dr. Domenic Polite   History of Present Illness    Sharon Little is a 82 y.o. female with past medical history of chronic atrial fibrillation, HLD, Type 2 DM, known LBBB, and back pain who presents to [facility] on 04/03/2016 as a transfer from [facility].   She initially presented to [facility] on 10/5 for severe sudden onset dyspnea. Was sitting in the car outside a department store when she developed sudden onset dyspnea and felt like she could barely catch her breath. Denies any chest discomfort or palpitations at that time. Upon EMS arrival, oxygen saturations were in the 60's and she was placed on BiPAP (has now been weaned down to [place]).   Initial labs showed a WBC 10.5, Hgb 13.5, and platelets 337. K+ 3.8. Creatinine 0.69. BNP of 725. Initial troponin was negative but cyclic values were [id number] and 0.24  CXR with pulmonary edema. Echocardiogram was performed showing EF of 50% with no wall motion abnormalities. Mild aortic sclerosis with mild to moderate aortic stenosis was noted. Mild MR, mild TR, and moderate pulmonary HTN with PA Peak pressure of 44 mmHg was present as well.   She was diuresed with IV Lasix 40mg  BID and transferred to [facility] for further treatment of her CHF and elevated troponin values.   During this encounter, she reports her breathing has significantly improved. Still denies any chest discomfort or palpitations. Has occasional lower extremity edema and orthopnea. She is active at baseline, currently living with her son but able to perform ADL's independently and drive herself to various places. She does report worsening dyspnea with exertion for the past 2 months. Notices this when walking around the store or using the stationary bike.   Denies any prior  history of CAD. Has known atrial fibrillation and reports good compliance with her medication regimen which includes Cardizem CD 240mg  daily, Lasix 40mg  BID, HCTZ 12.5mg  daily, Lovastatin 20mg  daily, and Coumadin.   Past Medical History    Past Medical History:  Diagnosis Date  . Chronic atrial fibrillation ([place])   . Chronic back pain   . Hyperlipidemia   . Leg edema     No past surgical history on file.   Allergies  Allergies  Allergen Reactions  . Penicillins     REACTION: rash     Home Medications    Prior to Admission medications   Medication Sig Start Date End Date Taking? Authorizing Provider  acetaminophen (TYLENOL) 650 MG CR tablet Take 650 mg by mouth every 8 (eight) hours as needed.      Historical Provider, MD  B Complex Vitamins (B COMPLEX-B12) TABS Take 1 tablet by mouth daily.     Historical Provider, MD  Cholecalciferol (VITAMIN D3) 1000 UNITS tablet Take 1,000 Units by mouth daily.      Historical Provider, MD  diltiazem (CARDIZEM CD) 240 MG 24 hr capsule Take 240 mg by mouth daily.     Historical Provider, MD  DimenhyDRINATE (TRAVEL SICKNESS PO) Take 1 tablet by mouth as needed.    Historical Provider, MD  furosemide (LASIX) 40 MG tablet Take 1 tablet by mouth Twice daily. 12/03/10   Historical Provider, MD  gabapentin (NEURONTIN) 600 MG tablet Take 600 mg by mouth 3 (three) times daily.    Historical Provider,  MD  hydrochlorothiazide 25 MG tablet Take 12.5 mg by mouth daily.     Historical Provider, MD  lovastatin (MEVACOR) 20 MG tablet Take 20 mg by mouth daily.      Historical Provider, MD  metFORMIN (GLUCOPHAGE) 500 MG tablet Take 1,000 mg by mouth 2 (two) times daily with a meal.     Historical Provider, MD  Multiple Vitamin (MULTIVITAMIN) tablet Take 1 tablet by mouth daily.      Historical Provider, MD  warfarin (COUMADIN) 4 MG tablet Take by mouth as directed.     Historical Provider, MD    Family History    Family History  Problem Relation Age of  Onset  . Heart disease Mother   . Colon cancer Father   . Heart disease Sister   . Heart disease Brother     Social History    Social History   Social History  . Marital status: Divorced    Spouse name: N/A  . Number of children: N/A  . Years of education: N/A   Occupational History  . Not on file.   Social History Main Topics  . Smoking status: Former Smoker    Packs/day: 2.00    Years: 24.00    Types: Cigarettes    Quit date: 06/29/1968  . Smokeless tobacco: Never Used  . Alcohol use No  . Drug use: No  . Sexual activity: Not on file   Other Topics Concern  . Not on file   Social History Narrative   Divorced, retired.      Review of Systems    General:  No chills, fever, night sweats or weight changes.  Cardiovascular:  No chest pain,  edema, orthopnea, palpitations, paroxysmal nocturnal dyspnea. Positive for dyspnea with exertion.  Dermatological: No rash, lesions/masses Respiratory: No cough, Positive for dyspnea. Urologic: No hematuria, dysuria Abdominal:   No nausea, vomiting, diarrhea, bright red blood per rectum, melena, or hematemesis Neurologic:  No visual changes, wkns, changes in mental status. All other systems reviewed and are otherwise negative except as noted above.  Physical Exam    Blood pressure 125/63, pulse (!) 44, temperature 97.7 F (36.5 C), temperature source Oral, resp. rate 15, SpO2 95 %.  General: Pleasant elderly Caucasian female appearing in no acute distress. Head: Normocephalic, atraumatic, sclera non-icteric, no xanthomas, nares are without discharge.  Neck: No carotid bruits. JVD not elevated.  Lungs: Respirations regular and unlabored, without wheezes. Decreased breath sounds at bases bilaterally.  Heart: Irregularly irregular. No S3 or S4.  No rubs, or gallops appreciated. 2/6 SEM at RUSB.  Abdomen: Soft, non-tender, non-distended with normoactive bowel sounds. No hepatomegaly. No rebound/guarding. No obvious abdominal  masses. Msk:  Strength and tone appear normal for age. No joint deformities or effusions. Extremities: No clubbing or cyanosis. Trace lower extremity edema.  Distal pedal pulses are 2+ bilaterally. Neuro: Alert and oriented X 3. Moves all extremities spontaneously. No focal deficits noted. Psych:  Responds to questions appropriately with a normal affect. Skin: No rashes or lesions noted  Labs    Troponin (Point of Care Test) No results for input(s): TROPIPOC in the last 72 hours. No results for input(s): CKTOTAL, CKMB, TROPONINI in the last 72 hours. No results found for: WBC, HGB, HCT, MCV, PLT No results for input(s): NA, K, CL, CO2, BUN, CREATININE, CALCIUM, PROT, BILITOT, ALKPHOS, ALT, AST, GLUCOSE in the last 168 hours.   Radiology Studies    None on File  EKG & Cardiac Imaging  EKG: No new tracings.   ECHOCARDIOGRAM: 04/02/2016 (Report in Records from [place] - CD Available with Images) EF of 50% with no wall motion abnormalities. Mild aortic sclerosis with mild to moderate aortic stenosis was noted. Mild MR, mild TR, and moderate pulmonary HTN with PA Peak pressure of 44 mmHg.  Assessment & Plan    1. Acute on chronic diastolic CHF (congestive heart failure) ([place]) - developed sudden onset dyspnea on 10/5 while at rest with oxygen saturations initially in the 60's and requiring BiPAP.  - initial labs showed a BNP of 725. CXR with pulmonary edema. Echocardiogram was performed showing EF of 50% with no wall motion abnormalities. Mild aortic sclerosis with mild to moderate aortic stenosis was noted. Mild MR, mild TR, and moderate pulmonary HTN with PA Peak pressure of 44 mmHg was present as well.  - diuresed with IV Lasix 40mg  BID. Will continue with diuresis for now as she still appears volume overloaded.   2. Chronic atrial fibrillation (HCC) - This patients CHA2DS2-VASc Score and unadjusted Ischemic Stroke Rate (% per year) is equal to 7.2 % stroke rate/year from a score of  5 (CHF, HTN, Female, Age (2)). On Coumadin prior to admission. Will bridge with Heparin for now once INR < 2.0 in case she requires further ischemic evaluation.   - continue Cardizem CD 240mg  daily.   3. Moderate aortic valve stenosis - echo showed mild aortic sclerosis with mild to moderate aortic stenosis.  - will need continued follow-up as an outpatient.   4. Elevated Troponin - values at [facility] elevated to 0.34 and 0.24. Will repeat value here. - likely secondary to demand ischemia in the setting of CHF exacerbation. She does note worsening dyspnea with exertion for the past 2 months.  - could consider NST for risk-stratification once respiratory status improves.   5. Type 2 DM - hold PTA Metformin. SSI while admitted.   6. HLD - continue statin therapy.    Signed, Erma Heritage, PA-C 04/03/2016, 8:10 PM Pager: [phone]

## 2016-04-04 ENCOUNTER — Encounter (HOSPITAL_COMMUNITY): Payer: Self-pay | Admitting: *Deleted

## 2016-04-04 DIAGNOSIS — I5041 Acute combined systolic (congestive) and diastolic (congestive) heart failure: Secondary | ICD-10-CM | POA: Diagnosis present

## 2016-04-04 LAB — BASIC METABOLIC PANEL
ANION GAP: 10 (ref 5–15)
BUN: 13 mg/dL (ref 6–20)
CALCIUM: 10.5 mg/dL — AB (ref 8.9–10.3)
CHLORIDE: 100 mmol/L — AB (ref 101–111)
CO2: 29 mmol/L (ref 22–32)
Creatinine, Ser: 0.77 mg/dL (ref 0.44–1.00)
GFR calc Af Amer: >60 mL/min (ref >60)
GFR calc non Af Amer: >60 mL/min (ref >60)
GLUCOSE: 121 mg/dL — AB (ref 65–99)
Potassium: 3.5 mmol/L (ref 3.5–5.1)
Sodium: 139 mmol/L (ref 135–145)

## 2016-04-04 LAB — GLUCOSE, CAPILLARY
GLUCOSE-CAPILLARY: 129 mg/dL — AB (ref 65–99)
GLUCOSE-CAPILLARY: 149 mg/dL — AB (ref 65–99)
GLUCOSE-CAPILLARY: 128 mg/dL — AB (ref 65–99)
Glucose-Capillary: 122 mg/dL — ABNORMAL HIGH (ref 65–99)
Glucose-Capillary: 140 mg/dL — ABNORMAL HIGH (ref 65–99)

## 2016-04-04 LAB — CBC
HEMATOCRIT: 39.1 % (ref 36.0–46.0)
HEMOGLOBIN: 12.7 g/dL (ref 12.0–15.0)
MCH: 31.7 pg (ref 26.0–34.0)
MCHC: 32.5 g/dL (ref 30.0–36.0)
MCV: 97.5 fL (ref 78.0–100.0)
Platelets: 248 10*3/uL (ref 150–400)
RBC: 4.01 MIL/uL (ref 3.87–5.11)
RDW: 13.6 % (ref 11.5–15.5)
WBC: 8.3 10*3/uL (ref 4.0–10.5)

## 2016-04-04 LAB — PROTIME-INR
INR: 1.86
Prothrombin Time: 21.6 seconds — ABNORMAL HIGH (ref 11.4–15.2)

## 2016-04-04 LAB — LIPID PANEL
Cholesterol: 146 mg/dL (ref 0–200)
HDL: 38 mg/dL — AB (ref >40)
LDL CALC: 74 mg/dL (ref 0–99)
Total CHOL/HDL Ratio: 3.8 RATIO
Triglycerides: 169 mg/dL — ABNORMAL HIGH (ref <150)
VLDL: 34 mg/dL (ref 0–40)

## 2016-04-04 LAB — TROPONIN I
TROPONIN I: 0.85 ng/mL — AB (ref <0.03)
Troponin I: 0.78 ng/mL (ref <0.03)

## 2016-04-04 LAB — HEPARIN LEVEL (UNFRACTIONATED): HEPARIN UNFRACTIONATED: 0.15 [IU]/mL — AB (ref 0.30–0.70)

## 2016-04-04 MED ORDER — HEPARIN (PORCINE) IN NACL 100-0.45 UNIT/ML-% IJ SOLN
1450.0000 [IU]/h | INTRAMUSCULAR | Status: DC
  Administered 2016-04-05: 1350 [IU]/h via INTRAVENOUS
  Administered 2016-04-06: 1450 [IU]/h via INTRAVENOUS
  Filled 2016-04-04 (×2): qty 250

## 2016-04-04 MED ORDER — GABAPENTIN 600 MG PO TABS
600.0000 mg | ORAL_TABLET | Freq: Three times a day (TID) | ORAL | Status: DC | PRN
  Administered 2016-04-04 – 2016-04-07 (×2): 600 mg via ORAL
  Filled 2016-04-04 (×2): qty 1

## 2016-04-04 MED ORDER — HEPARIN (PORCINE) IN NACL 100-0.45 UNIT/ML-% IJ SOLN
1000.0000 [IU]/h | INTRAMUSCULAR | Status: DC
  Administered 2016-04-04: 1000 [IU]/h via INTRAVENOUS
  Filled 2016-04-04: qty 250

## 2016-04-04 MED ORDER — FUROSEMIDE 40 MG PO TABS
40.0000 mg | ORAL_TABLET | Freq: Two times a day (BID) | ORAL | Status: DC
  Administered 2016-04-05 – 2016-04-07 (×5): 40 mg via ORAL
  Filled 2016-04-04 (×5): qty 1

## 2016-04-04 NOTE — Progress Notes (Signed)
ANTICOAGULATION CONSULT NOTE - Follow Up Consult  Pharmacy Consult for Heparin Indication: atrial fibrillation, r/o ACS  Allergies  Allergen Reactions  . Penicillins Swelling and Rash    Has patient had a PCN reaction causing immediate rash, facial/tongue/throat swelling, SOB or lightheadedness with hypotension: Yes Has patient had a PCN reaction causing severe rash involving mucus membranes or skin necrosis: No Has patient had a PCN reaction that required hospitalization No Has patient had a PCN reaction occurring within the last 10 years: No If all of the above answers are "NO", then may proceed with Cephalosporin use.    Patient Measurements: Height: 5\' 7"  (170.2 cm) Weight: 179 lb 0.2 oz (81.2 kg) IBW/kg (Calculated) : 61.6 Heparin Dosing Weight: 78.3kg  Vital Signs: Temp: 98.3 F (36.8 C) (10/07 1900) Temp Source: Oral (10/07 1900) BP: 115/94 (10/07 2125) Pulse Rate: 68 (10/07 2125)  Labs:  Recent Labs  04/03/16 2202 04/04/16 0310 04/04/16 0859 04/04/16 2054  HGB  --   --  12.7  --   HCT  --   --  39.1  --   PLT  --   --  248  --   LABPROT 26.9*  --  21.6*  --   INR 2.43  --  1.86  --   HEPARINUNFRC  --   --   --  0.15*  CREATININE  --   --  0.77  --   TROPONINI 0.92* 0.85* 0.78*  --     Estimated Creatinine Clearance: 56.3 mL/min (by C-G formula based on SCr of 0.77 mg/dL).   Medications:  Heparin @ 1000 units/hr  Assessment: 87yof started on heparin earlier today in setting of subtherapeutic INR while coumadin on hold. Initial heparin level is subtherapeutic at 0.15. No issues with infusion.  Goal of Therapy:  Heparin level 0.3-0.7 units/ml Monitor platelets by anticoagulation protocol: Yes   Plan:  1) Increase heparin to 1200 units/hr 2) Daily heparin level and CBC  Deboraha Sprang 04/04/2016,10:03 PM

## 2016-04-04 NOTE — Progress Notes (Signed)
DAILY PROGRESS NOTE  Subjective:  Breathing better today. Diuresed about 3.5L overnight. HR now in the 60's with frequent PVC's. Troponin declined from 0.92, 0.85 to 0.78. INR 1.86 today. No further chest pressure.  Objective:  Temp:  [97.7 F (36.5 C)-99.1 F (37.3 C)] 98.2 F (36.8 C) (10/07 1100) Pulse Rate:  [44-115] 72 (10/07 0737) Resp:  [15-16] 15 (10/07 0737) BP: (125-135)/(63-76) 135/67 (10/07 0737) SpO2:  [95 %-96 %] 96 % (10/07 0737) Weight:  [179 lb 0.2 oz (81.2 kg)] 179 lb 0.2 oz (81.2 kg) (10/06 1950) Weight change:   Intake/Output from previous day: No intake/output data recorded.  Intake/Output from this shift: Total I/O In: -  Out: 3500 [Urine:3500]  Medications: No current facility-administered medications on file prior to encounter.    Current Outpatient Prescriptions on File Prior to Encounter  Medication Sig Dispense Refill  . acetaminophen (TYLENOL) 650 MG CR tablet Take 650 mg by mouth every 8 (eight) hours as needed.      . B Complex Vitamins (B COMPLEX-B12) TABS Take 1 tablet by mouth daily.     . Cholecalciferol (VITAMIN D3) 1000 UNITS tablet Take 1,000 Units by mouth daily.      Marland Kitchen diltiazem (CARDIZEM CD) 240 MG 24 hr capsule Take 240 mg by mouth daily.     . DimenhyDRINATE (TRAVEL SICKNESS PO) Take 1 tablet by mouth as needed.    . furosemide (LASIX) 40 MG tablet Take 1 tablet by mouth Twice daily.    Marland Kitchen gabapentin (NEURONTIN) 600 MG tablet Take 600 mg by mouth 3 (three) times daily.    . hydrochlorothiazide 25 MG tablet Take 12.5 mg by mouth daily.     Marland Kitchen lovastatin (MEVACOR) 20 MG tablet Take 20 mg by mouth daily.      . metFORMIN (GLUCOPHAGE) 500 MG tablet Take 1,000 mg by mouth 2 (two) times daily with a meal.     . Multiple Vitamin (MULTIVITAMIN) tablet Take 1 tablet by mouth daily.      Marland Kitchen warfarin (COUMADIN) 4 MG tablet Take by mouth as directed.       Physical Exam: General appearance: alert and no distress Neck: no carotid bruit  and no JVD Lungs: diminished breath sounds bilaterally Heart: regular rate and rhythm, S1, S2 normal and systolic murmur: systolic ejection 3/6, crescendo at 2nd right intercostal space Abdomen: soft, non-tender; bowel sounds normal; no masses,  no organomegaly Extremities: extremities normal, atraumatic, no cyanosis or edema Pulses: 2+ and symmetric Skin: Skin color, texture, turgor normal. No rashes or lesions Neurologic: Grossly normal PSych: Pleasant  Lab Results: Results for orders placed or performed during the hospital encounter of 04/03/16 (from the past 48 hour(s))  MRSA PCR Screening     Status: None   Collection Time: 04/03/16  6:40 PM  Result Value Ref Range   MRSA by PCR NEGATIVE NEGATIVE    Comment:        The GeneXpert MRSA Assay (FDA approved for NASAL specimens only), is one component of a comprehensive MRSA colonization surveillance program. It is not intended to diagnose MRSA infection nor to guide or monitor treatment for MRSA infections.   Troponin I     Status: Abnormal   Collection Time: 04/03/16 10:02 PM  Result Value Ref Range   Troponin I 0.92 (HH) <0.03 ng/mL    Comment: CRITICAL RESULT CALLED TO, READ BACK BY AND VERIFIED WITH: M.MCMILLIAM,RN 2327 04/03/16 G.MCADOO   Protime-INR     Status: Abnormal  Collection Time: 04/03/16 10:02 PM  Result Value Ref Range   Prothrombin Time 26.9 (H) 11.4 - 15.2 seconds   INR 2.43   Troponin I     Status: Abnormal   Collection Time: 04/04/16  3:10 AM  Result Value Ref Range   Troponin I 0.85 (HH) <0.03 ng/mL    Comment: CRITICAL VALUE NOTED.  VALUE IS CONSISTENT WITH PREVIOUSLY REPORTED AND CALLED VALUE.  Lipid panel     Status: Abnormal   Collection Time: 04/04/16  3:10 AM  Result Value Ref Range   Cholesterol 146 0 - 200 mg/dL   Triglycerides 169 (H) <150 mg/dL   HDL 38 (L) >40 mg/dL   Total CHOL/HDL Ratio 3.8 RATIO   VLDL 34 0 - 40 mg/dL   LDL Cholesterol 74 0 - 99 mg/dL    Comment:        Total  Cholesterol/HDL:CHD Risk Coronary Heart Disease Risk Table                     Men   Women  1/2 Average Risk   3.4   3.3  Average Risk       5.0   4.4  2 X Average Risk   9.6   7.1  3 X Average Risk  23.4   11.0        Use the calculated Patient Ratio above and the CHD Risk Table to determine the patient's CHD Risk.        ATP III CLASSIFICATION (LDL):  <100     mg/dL   Optimal  100-129  mg/dL   Near or Above                    Optimal  130-159  mg/dL   Borderline  160-189  mg/dL   High  >190     mg/dL   Very High   Glucose, capillary     Status: Abnormal   Collection Time: 04/04/16  6:24 AM  Result Value Ref Range   Glucose-Capillary 122 (H) 65 - 99 mg/dL   Comment 1 Notify RN   Glucose, capillary     Status: Abnormal   Collection Time: 04/04/16  7:33 AM  Result Value Ref Range   Glucose-Capillary 129 (H) 65 - 99 mg/dL   Comment 1 Capillary Specimen   Troponin I     Status: Abnormal   Collection Time: 04/04/16  8:59 AM  Result Value Ref Range   Troponin I 0.78 (HH) <0.03 ng/mL    Comment: CRITICAL VALUE NOTED.  VALUE IS CONSISTENT WITH PREVIOUSLY REPORTED AND CALLED VALUE.  Basic metabolic panel     Status: Abnormal   Collection Time: 04/04/16  8:59 AM  Result Value Ref Range   Sodium 139 135 - 145 mmol/L   Potassium 3.5 3.5 - 5.1 mmol/L   Chloride 100 (L) 101 - 111 mmol/L   CO2 29 22 - 32 mmol/L   Glucose, Bld 121 (H) 65 - 99 mg/dL   BUN 13 6 - 20 mg/dL   Creatinine, Ser 0.77 0.44 - 1.00 mg/dL   Calcium 10.5 (H) 8.9 - 10.3 mg/dL   GFR calc non Af Amer >60 >60 mL/min   GFR calc Af Amer >60 >60 mL/min    Comment: (NOTE) The eGFR has been calculated using the CKD EPI equation. This calculation has not been validated in all clinical situations. eGFR's persistently <60 mL/min signify possible Chronic Kidney Disease.    Anion  gap 10 5 - 15  CBC     Status: None   Collection Time: 04/04/16  8:59 AM  Result Value Ref Range   WBC 8.3 4.0 - 10.5 K/uL   RBC 4.01 3.87  - 5.11 MIL/uL   Hemoglobin 12.7 12.0 - 15.0 g/dL   HCT 39.1 36.0 - 46.0 %   MCV 97.5 78.0 - 100.0 fL   MCH 31.7 26.0 - 34.0 pg   MCHC 32.5 30.0 - 36.0 g/dL   RDW 13.6 11.5 - 15.5 %   Platelets 248 150 - 400 K/uL  Protime-INR     Status: Abnormal   Collection Time: 04/04/16  8:59 AM  Result Value Ref Range   Prothrombin Time 21.6 (H) 11.4 - 15.2 seconds   INR 1.86   Glucose, capillary     Status: Abnormal   Collection Time: 04/04/16 11:33 AM  Result Value Ref Range   Glucose-Capillary 149 (H) 65 - 99 mg/dL   Comment 1 Notify RN     Imaging: Imaging results have been reviewed  Assessment:  1. Principal Problem: 2.   Acute on chronic diastolic CHF (congestive heart failure) ([place]) 3. Active Problems: 4.   MR (mitral regurgitation) 5.   Chronic atrial fibrillation (HCC) 6.   Moderate aortic valve stenosis 7.   Elevated troponin 8.   Acute combined systolic and diastolic (congestive) hrt fail ([place]) 9.   Plan:  1. Marked improvement in breathing overnight with diuresis - out 3.5L. Creatinine appears stable. Continue IV lasix today and transition to po lasix tomorrow. Troponin rise to 0.92 and falling. Warfarin being held and on IV heparin. Given relatively newly reduced LV function and elevated troponin, suspect acute MI likely preceeded flash pulmonary edema/CHF. This is in the setting of mild to moderate aortic stenosis. HR is better controlled today, however, she is having frequent PVC's. Would recommend LHC next week.   Time Spent Directly with Patient:  15 minutes  Length of Stay:  LOS: 1 day   Pixie Casino, MD, [facility] Attending Cardiologist [place] 04/04/2016, 12:25 PM

## 2016-04-04 NOTE — Progress Notes (Signed)
ANTICOAGULATION CONSULT NOTE - Initial Consult  Pharmacy Consult for heparin while INR < 2 Indication: atrial fibrillation, r/o ACS  Allergies  Allergen Reactions  . Penicillins     REACTION: rash    Patient Measurements: Height: 5\' 7"  (170.2 cm) Weight: 179 lb 0.2 oz (81.2 kg) IBW/kg (Calculated) : 61.6 Heparin Dosing Weight: 78.3 kg  Vital Signs: Temp: 98.2 F (36.8 C) (10/07 0737) Temp Source: Oral (10/07 0737) BP: 135/67 (10/07 0737) Pulse Rate: 72 (10/07 0737)  Labs:  Recent Labs  04/03/16 2202 04/04/16 0310 04/04/16 0859  HGB  --   --  12.7  HCT  --   --  39.1  PLT  --   --  248  LABPROT 26.9*  --  21.6*  INR 2.43  --  1.86  CREATININE  --   --  0.77  TROPONINI 0.92* 0.85* 0.78*    Estimated Creatinine Clearance: 56.3 mL/min (by C-G formula based on SCr of 0.77 mg/dL).   Medical History: Past Medical History:  Diagnosis Date  . Chronic atrial fibrillation ([place])   . Chronic back pain   . Hyperlipidemia   . Leg edema     Medications:  Scheduled:  . aspirin EC  81 mg Oral Daily  . diltiazem  240 mg Oral Daily  . furosemide  40 mg Intravenous BID  . gabapentin  600 mg Oral TID  . insulin aspart  0-15 Units Subcutaneous TID WC  . pravastatin  20 mg Oral q1800       Assessment: 82 yo female admitted with r/o ACS.  On chronic Coumadin PTA.  Today's INR down to 1.86.  Pharmacy asked to begin IV heparin when INR < 2.  No bleeding or complications noted.  Per patient home Coumadin dose is 4 mg daily except 6 mg on Tuesdays.  Last dose was Wed 10/4.  Goal of Therapy:  Heparin level 0.3-0.7 units/ml Monitor platelets by anticoagulation protocol: Yes   Plan:  1. Start IV heparin at 1000 units/hr. 2. Check heparin level 8 hrs after gtt starts. 3. Daily heparin level and CBC. 4. F/u plans for further cardiac work-up/resuming Coumadin.  Uvaldo Rising, BCPS  Clinical Pharmacist Pager [phone]  04/04/2016 11:02 AM

## 2016-04-05 LAB — HEPARIN LEVEL (UNFRACTIONATED)
HEPARIN UNFRACTIONATED: 0.25 [IU]/mL — AB (ref 0.30–0.70)
HEPARIN UNFRACTIONATED: 0.32 [IU]/mL (ref 0.30–0.70)
Heparin Unfractionated: 0.34 IU/mL (ref 0.30–0.70)

## 2016-04-05 LAB — CBC
HEMATOCRIT: 38.9 % (ref 36.0–46.0)
Hemoglobin: 12.9 g/dL (ref 12.0–15.0)
MCH: 32.1 pg (ref 26.0–34.0)
MCHC: 33.2 g/dL (ref 30.0–36.0)
MCV: 96.8 fL (ref 78.0–100.0)
Platelets: 280 10*3/uL (ref 150–400)
RBC: 4.02 MIL/uL (ref 3.87–5.11)
RDW: 13.3 % (ref 11.5–15.5)
WBC: 10.1 10*3/uL (ref 4.0–10.5)

## 2016-04-05 LAB — BASIC METABOLIC PANEL
Anion gap: 10 (ref 5–15)
BUN: 18 mg/dL (ref 6–20)
CALCIUM: 10.6 mg/dL — AB (ref 8.9–10.3)
CO2: 28 mmol/L (ref 22–32)
CREATININE: 0.84 mg/dL (ref 0.44–1.00)
Chloride: 99 mmol/L — ABNORMAL LOW (ref 101–111)
GFR calc non Af Amer: >60 mL/min (ref >60)
Glucose, Bld: 127 mg/dL — ABNORMAL HIGH (ref 65–99)
Potassium: 3.7 mmol/L (ref 3.5–5.1)
SODIUM: 137 mmol/L (ref 135–145)

## 2016-04-05 LAB — GLUCOSE, CAPILLARY
GLUCOSE-CAPILLARY: 116 mg/dL — AB (ref 65–99)
GLUCOSE-CAPILLARY: 116 mg/dL — AB (ref 65–99)
GLUCOSE-CAPILLARY: 127 mg/dL — AB (ref 65–99)
Glucose-Capillary: 138 mg/dL — ABNORMAL HIGH (ref 65–99)

## 2016-04-05 LAB — PROTIME-INR
INR: 1.49
Prothrombin Time: 18.2 seconds — ABNORMAL HIGH (ref 11.4–15.2)

## 2016-04-05 MED ORDER — SODIUM CHLORIDE 0.9% FLUSH: 3.0000 mL | INTRAVENOUS | Status: DC | PRN

## 2016-04-05 MED ORDER — SODIUM CHLORIDE 0.9 % WEIGHT BASED INFUSION
1.0000 mL/kg/h | INTRAVENOUS | Status: DC
  Administered 2016-04-06: 1 mL/kg/h via INTRAVENOUS

## 2016-04-05 MED ORDER — SODIUM CHLORIDE 0.9 % WEIGHT BASED INFUSION
3.0000 mL/kg/h | INTRAVENOUS | Status: DC
  Administered 2016-04-06: 3 mL/kg/h via INTRAVENOUS

## 2016-04-05 MED ORDER — HYDROCODONE-ACETAMINOPHEN 5-325 MG PO TABS
1.0000 | ORAL_TABLET | ORAL | Status: DC | PRN
  Administered 2016-04-05 – 2016-04-07 (×8): 1 via ORAL
  Filled 2016-04-05 (×8): qty 1

## 2016-04-05 MED ORDER — SODIUM CHLORIDE 0.9% FLUSH
3.0000 mL | Freq: Two times a day (BID) | INTRAVENOUS | Status: DC
  Administered 2016-04-05 – 2016-04-06 (×2): 3 mL via INTRAVENOUS

## 2016-04-05 MED ORDER — ASPIRIN 81 MG PO CHEW
81.0000 mg | CHEWABLE_TABLET | ORAL | Status: AC
  Administered 2016-04-06: 81 mg via ORAL
  Filled 2016-04-05: qty 1

## 2016-04-05 MED ORDER — SODIUM CHLORIDE 0.9 % IV SOLN: 250.0000 mL | INTRAVENOUS | Status: DC | PRN

## 2016-04-05 NOTE — Progress Notes (Signed)
ANTICOAGULATION CONSULT NOTE - Follow Up Consult  Pharmacy Consult for Heparin Indication: atrial fibrillation, r/o ACS  Allergies  Allergen Reactions  . Penicillins Swelling and Rash    Has patient had a PCN reaction causing immediate rash, facial/tongue/throat swelling, SOB or lightheadedness with hypotension: Yes Has patient had a PCN reaction causing severe rash involving mucus membranes or skin necrosis: No Has patient had a PCN reaction that required hospitalization No Has patient had a PCN reaction occurring within the last 10 years: No If all of the above answers are "NO", then may proceed with Cephalosporin use.    Patient Measurements: Height: 5\' 7"  (170.2 cm) Weight: 175 lb 4.3 oz (79.5 kg) IBW/kg (Calculated) : 61.6 Heparin Dosing Weight: 78.3kg  Vital Signs: Temp: 98.1 F (36.7 C) (10/08 0353) Temp Source: Oral (10/08 0353) BP: 109/63 (10/08 0353) Pulse Rate: 65 (10/08 0353)  Labs:  Recent Labs  04/03/16 2202 04/04/16 0310 04/04/16 0859 04/04/16 2054 04/05/16 0420  HGB  --   --  12.7  --  12.9  HCT  --   --  39.1  --  38.9  PLT  --   --  248  --  280  LABPROT 26.9*  --  21.6*  --  18.2*  INR 2.43  --  1.86  --  1.49  HEPARINUNFRC  --   --   --  0.15* 0.25*  CREATININE  --   --  0.77  --  0.84  TROPONINI 0.92* 0.85* 0.78*  --   --     Estimated Creatinine Clearance: 53.2 mL/min (by C-G formula based on SCr of 0.84 mg/dL).  Assessment: 87yof on coumadin PTA - on hold. Pt on heparin bridge for r/o ACS. Heparin level remains subtherapeutic (0.25) on 1200 units/hr. No issues with line or bleeding reported per RN. CBC stable.  Goal of Therapy:  Heparin level 0.3-0.7 units/ml Monitor platelets by anticoagulation protocol: Yes   Plan:  Increase heparin to 1350 units/hr Will f/u 8hr heparin level  Sherlon Handing, PharmD, BCPS Clinical pharmacist, pager [phone] 04/05/2016,5:35 AM

## 2016-04-05 NOTE — Progress Notes (Signed)
Patient Name: Sharon Little Date of Encounter: 04/05/2016  Principal Problem:   Acute on chronic diastolic CHF (congestive heart failure) ([place]) Active Problems:   MR (mitral regurgitation)   Chronic atrial fibrillation ([facility])   Moderate aortic valve stenosis   Elevated troponin   Acute combined systolic and diastolic (congestive) hrt fail ([place])   Length of Stay: 2  SUBJECTIVE  Feels better, no dyspnea at rest. Some discomfort in right back occurs chronically off and on and is probably postherpetic neuralgia. 4.5L net diuresis since admission, renal parameters still OK. Fair rate control (90 at rest). Echo at [facility] reports   [place] . aspirin EC  81 mg Oral Daily  . diltiazem  240 mg Oral Daily  . furosemide  40 mg Oral BID  . insulin aspart  0-15 Units Subcutaneous TID WC  . pravastatin  20 mg Oral q1800    OBJECTIVE   Intake/Output Summary (Last 24 hours) at 04/05/16 0956 Last data filed at 04/05/16 0900  Gross per 24 hour  Intake            244.7 ml  Output             4800 ml  Net          -4555.3 ml   Filed Weights   04/03/16 1950 04/05/16 0353  Weight: 179 lb 0.2 oz (81.2 kg) 175 lb 4.3 oz (79.5 kg)    PHYSICAL EXAM Vitals:   04/04/16 2125 04/04/16 2340 04/05/16 0353 04/05/16 0800  BP: (!) 115/94 124/67 109/63   Pulse: 68 67 65   Resp: (!) 22  16   Temp:  99 F (37.2 C) 98.1 F (36.7 C) 98.4 F (36.9 C)  TempSrc:  Oral Oral Oral  SpO2: 91% 94% 91%   Weight:   175 lb 4.3 oz (79.5 kg)   Height:       General: Alert, oriented x3, no distress Head: no evidence of trauma, PERRL, EOMI, no exophtalmos or lid lag, no myxedema, no xanthelasma; normal ears, nose and oropharynx Neck: normal jugular venous pulsations and no hepatojugular reflux; brisk carotid pulses without delay and no carotid bruits Chest: clear to auscultation, no signs of consolidation by percussion or palpation, normal fremitus, symmetrical and full respiratory  excursions Cardiovascular: normal position and quality of the apical impulse, irregular rhythm, normal first and paradoxically split second heart sounds, no rubs or gallops, no diastolic murmur. 2-3/6 early to mid peaking AS murmur Abdomen: no tenderness or distention, no masses by palpation, no abnormal pulsatility or arterial bruits, normal bowel sounds, no hepatosplenomegaly Extremities: no clubbing, cyanosis or edema; 2+ radial, ulnar and brachial pulses bilaterally; 2+ right femoral, posterior tibial and dorsalis pedis pulses; 2+ left femoral, posterior tibial and dorsalis pedis pulses; no subclavian or femoral bruits Neurological: grossly nonfocal  LABS  CBC  Recent Labs  04/04/16 0859 04/05/16 0420  WBC 8.3 10.1  HGB 12.7 12.9  HCT 39.1 38.9  MCV 97.5 96.8  PLT 248 [id number]   Basic Metabolic Panel  Recent Labs  04/04/16 0859 04/05/16 0420  NA 139 137  K 3.5 3.7  CL 100* 99*  CO2 29 28  GLUCOSE 121* 127*  BUN 13 18  CREATININE 0.77 0.84  CALCIUM 10.5* 10.6*   Liver Function Tests No results for input(s): AST, ALT, ALKPHOS, BILITOT, PROT, ALBUMIN in the last 72 hours. No results for input(s): LIPASE, AMYLASE in the last 72 hours. Cardiac Enzymes  Recent Labs  04/03/16 2202  04/04/16 0310 04/04/16 0859  TROPONINI 0.92* 0.85* 0.78*   BNP Invalid input(s): POCBNP D-Dimer No results for input(s): DDIMER in the last 72 hours. Hemoglobin A1C No results for input(s): HGBA1C in the last 72 hours. Fasting Lipid Panel  Recent Labs  04/04/16 0310  CHOL 146  HDL 38*  LDLCALC 74  TRIG 169*  CHOLHDL 3.8   Radiology Studies Imaging results have been reviewed and No results found.  TELE AFib with PVCs  ECG AFib w PVCs, LBBB  ASSESSMENT AND PLAN  1. Acute pulmonary edema, resolved: concern for acute coronary insufficiency as etiology. Appears to be euvolemic. Switch to PO diuretics. Echo still pending. 2. Mild-moderate AS: per echo at [facility]; exam is  consistent with moderate AS. 3. Abnormal troponin: possible "tail end" of acute MI. ECG nondiagnostic due to LBBB. Plan cardiac cath and possible PCI/stent tomorrow. This procedure has been fully reviewed with the patient and written informed consent has been obtained. 4. AFib: rate control is fair. On IV heparin, warfarin stopped for anticipated cath tomorrow. 5. DM: hold metformin for cath 6. HLP: LDL not bad, low HDL and mild hyperTG c/w insulin resistance.     Sanda Klein, MD, [facility] [phone] office [phone] pager 04/05/2016 9:56 AM

## 2016-04-05 NOTE — Progress Notes (Signed)
ANTICOAGULATION CONSULT NOTE - Follow Up Consult  Pharmacy Consult for Heparin Indication: atrial fibrillation, r/o ACS  Allergies  Allergen Reactions  . Penicillins Swelling and Rash    Has patient had a PCN reaction causing immediate rash, facial/tongue/throat swelling, SOB or lightheadedness with hypotension: Yes Has patient had a PCN reaction causing severe rash involving mucus membranes or skin necrosis: No Has patient had a PCN reaction that required hospitalization No Has patient had a PCN reaction occurring within the last 10 years: No If all of the above answers are "NO", then may proceed with Cephalosporin use.    Patient Measurements: Height: 5\' 7"  (170.2 cm) Weight: 175 lb 4.3 oz (79.5 kg) IBW/kg (Calculated) : 61.6 Heparin Dosing Weight: 78.3kg  Vital Signs: Temp: 98.5 F (36.9 C) (10/08 1100) Temp Source: Oral (10/08 1100) BP: 155/65 (10/08 1003) Pulse Rate: 65 (10/08 0353)  Labs:  Recent Labs  04/03/16 2202 04/04/16 0310 04/04/16 0859 04/04/16 2054 04/05/16 0420 04/05/16 1347  HGB  --   --  12.7  --  12.9  --   HCT  --   --  39.1  --  38.9  --   PLT  --   --  248  --  280  --   LABPROT 26.9*  --  21.6*  --  18.2*  --   INR 2.43  --  1.86  --  1.49  --   HEPARINUNFRC  --   --   --  0.15* 0.25* 0.34  CREATININE  --   --  0.77  --  0.84  --   TROPONINI 0.92* 0.85* 0.78*  --   --   --     Estimated Creatinine Clearance: 53.2 mL/min (by C-G formula based on SCr of 0.84 mg/dL).  Assessment: 87yof on coumadin PTA - on hold. Pt on heparin bridge for r/o ACS. Heparin level is now therapeutic on heparin 1200 units/hr. No issues with line or bleeding reported per RN. CBC stable.  Goal of Therapy:  Heparin level 0.3-0.7 units/ml Monitor platelets by anticoagulation protocol: Yes   Plan:  Continue IV heparin at current rate. Confirm heparin level in 8 hrs. Daily heparin level and CBC. F/u plans for resuming Coumadin after cath tomorrow.  Uvaldo Rising, BCPS  Clinical Pharmacist Pager [phone]  04/05/2016 3:14 PM

## 2016-04-06 ENCOUNTER — Encounter (HOSPITAL_COMMUNITY)
Admission: AD | Disposition: A | Discharge status: Home / Self Care | Payer: Self-pay | Source: Other Acute Inpatient Hospital | Attending: Internal Medicine

## 2016-04-06 DIAGNOSIS — I2510 Atherosclerotic heart disease of native coronary artery without angina pectoris: Secondary | ICD-10-CM

## 2016-04-06 HISTORY — PX: CARDIAC CATHETERIZATION: SHX172

## 2016-04-06 LAB — BASIC METABOLIC PANEL
Anion gap: 13 (ref 5–15)
BUN: 12 mg/dL (ref 6–20)
CO2: 26 mmol/L (ref 22–32)
CREATININE: 0.75 mg/dL (ref 0.44–1.00)
Calcium: 10.2 mg/dL (ref 8.9–10.3)
Chloride: 99 mmol/L — ABNORMAL LOW (ref 101–111)
GFR calc Af Amer: >60 mL/min (ref >60)
GFR calc non Af Amer: >60 mL/min (ref >60)
Glucose, Bld: 129 mg/dL — ABNORMAL HIGH (ref 65–99)
Potassium: 3.4 mmol/L — ABNORMAL LOW (ref 3.5–5.1)
SODIUM: 138 mmol/L (ref 135–145)

## 2016-04-06 LAB — CBC
HCT: 37.1 % (ref 36.0–46.0)
Hemoglobin: 12.2 g/dL (ref 12.0–15.0)
MCH: 31.7 pg (ref 26.0–34.0)
MCHC: 32.9 g/dL (ref 30.0–36.0)
MCV: 96.4 fL (ref 78.0–100.0)
PLATELETS: 259 10*3/uL (ref 150–400)
RBC: 3.85 MIL/uL — ABNORMAL LOW (ref 3.87–5.11)
RDW: 13.4 % (ref 11.5–15.5)
WBC: 8 10*3/uL (ref 4.0–10.5)

## 2016-04-06 LAB — GLUCOSE, CAPILLARY
GLUCOSE-CAPILLARY: 101 mg/dL — AB (ref 65–99)
GLUCOSE-CAPILLARY: 132 mg/dL — AB (ref 65–99)
GLUCOSE-CAPILLARY: 133 mg/dL — AB (ref 65–99)
Glucose-Capillary: 137 mg/dL — ABNORMAL HIGH (ref 65–99)

## 2016-04-06 LAB — PROTIME-INR
INR: 1.29
PROTHROMBIN TIME: 16.2 s — AB (ref 11.4–15.2)

## 2016-04-06 LAB — HEPARIN LEVEL (UNFRACTIONATED): Heparin Unfractionated: 0.32 IU/mL (ref 0.30–0.70)

## 2016-04-06 SURGERY — LEFT HEART CATH AND CORONARY ANGIOGRAPHY

## 2016-04-06 MED ORDER — WARFARIN SODIUM 2 MG PO TABS
4.0000 mg | ORAL_TABLET | Freq: Once | ORAL | Status: AC
  Administered 2016-04-06: 4 mg via ORAL
  Filled 2016-04-06: qty 2

## 2016-04-06 MED ORDER — POTASSIUM CHLORIDE CRYS ER 20 MEQ PO TBCR
40.0000 meq | EXTENDED_RELEASE_TABLET | Freq: Once | ORAL | Status: AC
  Administered 2016-04-06: 40 meq via ORAL
  Filled 2016-04-06: qty 2

## 2016-04-06 MED ORDER — HEPARIN SODIUM (PORCINE) 1000 UNIT/ML IJ SOLN
INTRAMUSCULAR | Status: AC
  Filled 2016-04-06: qty 1

## 2016-04-06 MED ORDER — HEPARIN (PORCINE) IN NACL 100-0.45 UNIT/ML-% IJ SOLN: 1450.0000 [IU]/h | INTRAMUSCULAR | Status: DC

## 2016-04-06 MED ORDER — SODIUM CHLORIDE 0.9% FLUSH
3.0000 mL | Freq: Two times a day (BID) | INTRAVENOUS | Status: DC
  Administered 2016-04-06 – 2016-04-07 (×2): 3 mL via INTRAVENOUS

## 2016-04-06 MED ORDER — HEPARIN SODIUM (PORCINE) 1000 UNIT/ML IJ SOLN
INTRAMUSCULAR | Status: DC | PRN
Start: 2016-04-06 — End: 2016-04-06
  Administered 2016-04-06: 4000 [IU] via INTRAVENOUS

## 2016-04-06 MED ORDER — ACETAMINOPHEN 325 MG PO TABS: 650.0000 mg | ORAL_TABLET | ORAL | Status: DC | PRN

## 2016-04-06 MED ORDER — IOPAMIDOL (ISOVUE-370) INJECTION 76%
INTRAVENOUS | Status: DC | PRN
  Administered 2016-04-06: 70 mL via INTRA_ARTERIAL

## 2016-04-06 MED ORDER — VERAPAMIL HCL 2.5 MG/ML IV SOLN
INTRAVENOUS | Status: DC | PRN
  Administered 2016-04-06: 10 mL via INTRA_ARTERIAL

## 2016-04-06 MED ORDER — SODIUM CHLORIDE 0.9 % WEIGHT BASED INFUSION: 1.0000 mL/kg/h | INTRAVENOUS | Status: AC

## 2016-04-06 MED ORDER — FENTANYL CITRATE (PF) 100 MCG/2ML IJ SOLN
INTRAMUSCULAR | Status: DC | PRN
  Administered 2016-04-06: 25 ug via INTRAVENOUS

## 2016-04-06 MED ORDER — LIDOCAINE HCL (PF) 1 % IJ SOLN
INTRAMUSCULAR | Status: AC
  Filled 2016-04-06: qty 30

## 2016-04-06 MED ORDER — METOPROLOL TARTRATE 5 MG/5ML IV SOLN
INTRAVENOUS | Status: AC
  Filled 2016-04-06: qty 5

## 2016-04-06 MED ORDER — LIDOCAINE HCL (PF) 1 % IJ SOLN
INTRAMUSCULAR | Status: DC | PRN
  Administered 2016-04-06: 3 mL via SUBCUTANEOUS

## 2016-04-06 MED ORDER — HEPARIN (PORCINE) IN NACL 100-0.45 UNIT/ML-% IJ SOLN
1450.0000 [IU]/h | INTRAMUSCULAR | Status: DC
  Administered 2016-04-07: 1450 [IU]/h via INTRAVENOUS
  Filled 2016-04-06: qty 250

## 2016-04-06 MED ORDER — IOPAMIDOL (ISOVUE-370) INJECTION 76%
INTRAVENOUS | Status: AC
  Filled 2016-04-06: qty 100

## 2016-04-06 MED ORDER — METOPROLOL TARTRATE 5 MG/5ML IV SOLN
INTRAVENOUS | Status: DC | PRN
  Administered 2016-04-06: 5 mg via INTRAVENOUS

## 2016-04-06 MED ORDER — FENTANYL CITRATE (PF) 100 MCG/2ML IJ SOLN
INTRAMUSCULAR | Status: AC
  Filled 2016-04-06: qty 2

## 2016-04-06 MED ORDER — VERAPAMIL HCL 2.5 MG/ML IV SOLN
INTRAVENOUS | Status: AC
  Filled 2016-04-06: qty 2

## 2016-04-06 MED ORDER — ONDANSETRON HCL 4 MG/2ML IJ SOLN: 4.0000 mg | Freq: Four times a day (QID) | INTRAMUSCULAR | Status: DC | PRN

## 2016-04-06 MED ORDER — HEPARIN (PORCINE) IN NACL 2-0.9 UNIT/ML-% IJ SOLN
INTRAMUSCULAR | Status: DC | PRN
  Administered 2016-04-06: 1000 mL

## 2016-04-06 MED ORDER — HEPARIN (PORCINE) IN NACL 2-0.9 UNIT/ML-% IJ SOLN
INTRAMUSCULAR | Status: AC
  Filled 2016-04-06: qty 1000

## 2016-04-06 MED ORDER — SODIUM CHLORIDE 0.9 % IV SOLN: 250.0000 mL | INTRAVENOUS | Status: DC | PRN

## 2016-04-06 MED ORDER — POLYVINYL ALCOHOL 1.4 % OP SOLN
1.0000 [drp] | OPHTHALMIC | Status: DC | PRN
  Administered 2016-04-06: 1 [drp] via OPHTHALMIC
  Filled 2016-04-06: qty 15

## 2016-04-06 MED ORDER — SODIUM CHLORIDE 0.9% FLUSH: 3.0000 mL | INTRAVENOUS | Status: DC | PRN

## 2016-04-06 MED ORDER — WARFARIN - PHARMACIST DOSING INPATIENT: Freq: Every day | Status: DC

## 2016-04-06 SURGICAL SUPPLY — 9 items
CATH 5FR JL3.5 JR4 ANG PIG MP (CATHETERS) ×2 IMPLANT
DEVICE RAD COMP TR BAND LRG (VASCULAR PRODUCTS) ×2 IMPLANT
GLIDESHEATH SLEND SS 6F .021 (SHEATH) ×2 IMPLANT
KIT HEART LEFT (KITS) ×3 IMPLANT
PACK CARDIAC CATHETERIZATION (CUSTOM PROCEDURE TRAY) ×3 IMPLANT
TRANSDUCER W/STOPCOCK (MISCELLANEOUS) ×3 IMPLANT
TUBING CIL FLEX 10 FLL-RA (TUBING) ×3 IMPLANT
WIRE HI TORQ VERSACORE-J 145CM (WIRE) ×2 IMPLANT
WIRE SAFE-T 1.5MM-J .035X260CM (WIRE) ×2 IMPLANT

## 2016-04-06 NOTE — Progress Notes (Signed)
ANTICOAGULATION CONSULT NOTE - Follow Up Consult  Pharmacy Consult for Heparin, coumadin  Indication: atrial fibrillation, r/o ACS  Allergies  Allergen Reactions  . Penicillins Swelling and Rash    Has patient had a PCN reaction causing immediate rash, facial/tongue/throat swelling, SOB or lightheadedness with hypotension: Yes Has patient had a PCN reaction causing severe rash involving mucus membranes or skin necrosis: No Has patient had a PCN reaction that required hospitalization No Has patient had a PCN reaction occurring within the last 10 years: No If all of the above answers are "NO", then may proceed with Cephalosporin use.    Patient Measurements: Height: 5\' 7"  (170.2 cm) Weight: 175 lb 0.7 oz (79.4 kg) IBW/kg (Calculated) : 61.6 Heparin Dosing Weight: 78.3kg  Vital Signs: Temp: 98.3 F (36.8 C) (10/09 1642) Temp Source: Oral (10/09 1642) BP: 164/79 (10/09 1920) Pulse Rate: 84 (10/09 1920)  Labs:  Recent Labs  04/03/16 2202 04/04/16 0310  04/04/16 0859  04/05/16 0420 04/05/16 1347 04/05/16 2320 04/06/16 0251  HGB  --   --   < > 12.7  --  12.9  --   --  12.2  HCT  --   --   --  39.1  --  38.9  --   --  37.1  PLT  --   --   --  248  --  280  --   --  259  LABPROT 26.9*  --   --  21.6*  --  18.2*  --   --  16.2*  INR 2.43  --   --  1.86  --  1.49  --   --  1.29  HEPARINUNFRC  --   --   --   --   < > 0.25* 0.34 0.32 0.32  CREATININE  --   --   --  0.77  --  0.84  --   --  0.75  TROPONINI 0.92* 0.85*  --  0.78*  --   --   --   --   --   < > = values in this interval not displayed.  Estimated Creatinine Clearance: 55.8 mL/min (by C-G formula based on SCr of 0.75 mg/dL).  Assessment: 82 yo F on coumadin PTA for afib - on hold. Pt on heparin bridge for r/o ACS and now s/p cath with 2 vessel calcified CAD.  Heparin restarted per MD but will retime to 8 hours post sheath removal (removed at about 7pm; TR band placed) -heparin was at 1450 units/hr pre-cath with  heparin level= 0.32 -INR= 1.29  Spoke with Dr. Irish Lack: Patient may go to PCI. Pharmacy may begin coumadin but do not give more than the home regimen until plans are decided.   Goal of Therapy:  Heparin level 0.3-0.7 units/ml Monitor platelets by anticoagulation protocol: Yes   Plan:  -restart heparin at 1450 units/hr 8 hrs post sheath removal -Heparin level in 8 hours and daily wth CBC daily -Coumadin 4mg  po today -Daily INR -Will follow cath plans  Hildred Laser, Pharm D 04/06/2016 7:55 PM

## 2016-04-06 NOTE — Interval H&P Note (Signed)
Cath Lab Visit (complete for each Cath Lab visit)  Clinical Evaluation Leading to the Procedure:   ACS: Yes.    Non-ACS:    Anginal Classification: CCS IV  Anti-ischemic medical therapy: Minimal Therapy (1 class of medications)  Non-Invasive Test Results: No non-invasive testing performed  Prior CABG: No previous CABG      History and Physical Interval Note:  04/06/2016 6:23 PM  Sharon Little  has presented today for surgery, with the diagnosis of cp  The various methods of treatment have been discussed with the patient and family. After consideration of risks, benefits and other options for treatment, the patient has consented to  Procedure(s): Left Heart Cath and Coronary Angiography (N/A) as a surgical intervention .  The patient's history has been reviewed, patient examined, no change in status, stable for surgery.  I have reviewed the patient's chart and labs.  Questions were answered to the patient's satisfaction.     Larae Grooms

## 2016-04-06 NOTE — Progress Notes (Signed)
ANTICOAGULATION CONSULT NOTE - Follow Up Consult  Pharmacy Consult for Heparin Indication: atrial fibrillation, r/o ACS  Allergies  Allergen Reactions  . Penicillins Swelling and Rash    Has patient had a PCN reaction causing immediate rash, facial/tongue/throat swelling, SOB or lightheadedness with hypotension: Yes Has patient had a PCN reaction causing severe rash involving mucus membranes or skin necrosis: No Has patient had a PCN reaction that required hospitalization No Has patient had a PCN reaction occurring within the last 10 years: No If all of the above answers are "NO", then may proceed with Cephalosporin use.    Patient Measurements: Height: 5\' 7"  (170.2 cm) Weight: 175 lb 4.3 oz (79.5 kg) IBW/kg (Calculated) : 61.6 Heparin Dosing Weight: 78.3kg  Vital Signs: Temp: 98.7 F (37.1 C) (10/08 2312) Temp Source: Oral (10/08 2312) BP: 118/46 (10/08 2312) Pulse Rate: 76 (10/08 1229)  Labs:  Recent Labs  04/03/16 2202 04/04/16 0310 04/04/16 0859  04/05/16 0420 04/05/16 1347 04/05/16 2320  HGB  --   --  12.7  --  12.9  --   --   HCT  --   --  39.1  --  38.9  --   --   PLT  --   --  248  --  280  --   --   LABPROT 26.9*  --  21.6*  --  18.2*  --   --   INR 2.43  --  1.86  --  1.49  --   --   HEPARINUNFRC  --   --   --   < > 0.25* 0.34 0.32  CREATININE  --   --  0.77  --  0.84  --   --   TROPONINI 0.92* 0.85* 0.78*  --   --   --   --   < > = values in this interval not displayed.  Estimated Creatinine Clearance: 53.2 mL/min (by C-G formula based on SCr of 0.84 mg/dL).  Assessment: 82 yo F on coumadin PTA - on hold. Pt on heparin bridge for r/o ACS. Heparin level remains at low end of therapeutic on heparin 1350 units/hr. No bleeding noted. Plan for cath in a.m.  Goal of Therapy:  Heparin level 0.3-0.7 units/ml Monitor platelets by anticoagulation protocol: Yes   Plan:  Increase IV heparin to 1450 units/hr F/u a.m. level F/u plans for resuming Coumadin  after cath tomorrow.  Sherlon Handing, PharmD, BCPS Clinical pharmacist, pager [phone]  04/06/2016 12:06 AM

## 2016-04-06 NOTE — H&P (View-Only) (Signed)
DAILY PROGRESS NOTE  Subjective:  Feels well today. Potassium is slightly low. Total 5.6L negative. Switched to po lasix yesterday.   Objective:  Temp:  [98.3 F (36.8 C)-99.7 F (37.6 C)] 98.3 F (36.8 C) (10/09 0807) Pulse Rate:  [76-93] 93 (10/09 0807) Resp:  [14-25] 21 (10/09 0807) BP: (118-156)/(46-132) 155/77 (10/09 0807) SpO2:  [89 %-98 %] 98 % (10/09 0334) Weight:  [175 lb 0.7 oz (79.4 kg)] 175 lb 0.7 oz (79.4 kg) (10/09 0334) Weight change: -3.5 oz (-0.1 kg)  Intake/Output from previous day: 10/08 0701 - 10/09 0700 In: 734.9 [P.O.:180; I.V.:554.9] Out: 2125 [Urine:2125]  Intake/Output from this shift: No intake/output data recorded.  Medications: No current facility-administered medications on file prior to encounter.    Current Outpatient Prescriptions on File Prior to Encounter  Medication Sig Dispense Refill  . acetaminophen (TYLENOL) 650 MG CR tablet Take 650 mg by mouth every 8 (eight) hours as needed for pain.     . B Complex Vitamins (B COMPLEX-B12) TABS Take 1 tablet by mouth daily.     . Cholecalciferol (VITAMIN D3) 1000 UNITS tablet Take 1,000 Units by mouth daily.      Marland Kitchen diltiazem (CARDIZEM CD) 240 MG 24 hr capsule Take 240 mg by mouth daily.     . DimenhyDRINATE (TRAVEL SICKNESS PO) Take 1 tablet by mouth at bedtime as needed (headache).     . furosemide (LASIX) 40 MG tablet Take 40 mg by mouth Twice daily.     Marland Kitchen gabapentin (NEURONTIN) 600 MG tablet Take 600 mg by mouth 3 (three) times daily as needed (back pain).     . hydrochlorothiazide 25 MG tablet Take 25 mg by mouth daily.     Marland Kitchen lovastatin (MEVACOR) 20 MG tablet Take 20 mg by mouth daily with supper.     . warfarin (COUMADIN) 4 MG tablet Take 4 mg by mouth See admin instructions. Take 1 1/2 tablet (6 mg) by mouth on Tuesdays at 5pm, take 1 tablet (4 mg) on Sunday, Monday, Wednesday, Thursday, Friday, Saturday at 5pm.      Physical Exam: General appearance: alert and no distress Lungs:  clear to auscultation bilaterally Heart: regular rate and rhythm, S1, S2 normal and systolic murmur: systolic ejection 3/6, crescendo at 2nd right intercostal space Extremities: extremities normal, atraumatic, no cyanosis or edema Neurologic: Grossly normal  Lab Results: Results for orders placed or performed during the hospital encounter of 04/03/16 (from the past 48 hour(s))  Troponin I     Status: Abnormal   Collection Time: 04/04/16  8:59 AM  Result Value Ref Range   Troponin I 0.78 (HH) <0.03 ng/mL    Comment: CRITICAL VALUE NOTED.  VALUE IS CONSISTENT WITH PREVIOUSLY REPORTED AND CALLED VALUE.  Basic metabolic panel     Status: Abnormal   Collection Time: 04/04/16  8:59 AM  Result Value Ref Range   Sodium 139 135 - 145 mmol/L   Potassium 3.5 3.5 - 5.1 mmol/L   Chloride 100 (L) 101 - 111 mmol/L   CO2 29 22 - 32 mmol/L   Glucose, Bld 121 (H) 65 - 99 mg/dL   BUN 13 6 - 20 mg/dL   Creatinine, Ser 0.77 0.44 - 1.00 mg/dL   Calcium 10.5 (H) 8.9 - 10.3 mg/dL   GFR calc non Af Amer >60 >60 mL/min   GFR calc Af Amer >60 >60 mL/min    Comment: (NOTE) The eGFR has been calculated using the CKD EPI equation. This calculation  has not been validated in all clinical situations. eGFR's persistently <60 mL/min signify possible Chronic Kidney Disease.    Anion gap 10 5 - 15  CBC     Status: None   Collection Time: 04/04/16  8:59 AM  Result Value Ref Range   WBC 8.3 4.0 - 10.5 K/uL   RBC 4.01 3.87 - 5.11 MIL/uL   Hemoglobin 12.7 12.0 - 15.0 g/dL   HCT 39.1 36.0 - 46.0 %   MCV 97.5 78.0 - 100.0 fL   MCH 31.7 26.0 - 34.0 pg   MCHC 32.5 30.0 - 36.0 g/dL   RDW 13.6 11.5 - 15.5 %   Platelets 248 150 - 400 K/uL  Protime-INR     Status: Abnormal   Collection Time: 04/04/16  8:59 AM  Result Value Ref Range   Prothrombin Time 21.6 (H) 11.4 - 15.2 seconds   INR 1.86   Glucose, capillary     Status: Abnormal   Collection Time: 04/04/16 11:33 AM  Result Value Ref Range   Glucose-Capillary  149 (H) 65 - 99 mg/dL   Comment 1 Notify RN   Glucose, capillary     Status: Abnormal   Collection Time: 04/04/16  4:18 PM  Result Value Ref Range   Glucose-Capillary 140 (H) 65 - 99 mg/dL   Comment 1 Notify RN   Heparin level (unfractionated)     Status: Abnormal   Collection Time: 04/04/16  8:54 PM  Result Value Ref Range   Heparin Unfractionated 0.15 (L) 0.30 - 0.70 IU/mL    Comment:        IF HEPARIN RESULTS ARE BELOW EXPECTED VALUES, AND PATIENT DOSAGE HAS BEEN CONFIRMED, SUGGEST FOLLOW UP TESTING OF ANTITHROMBIN III LEVELS.   Glucose, capillary     Status: Abnormal   Collection Time: 04/04/16  9:48 PM  Result Value Ref Range   Glucose-Capillary 128 (H) 65 - 99 mg/dL   Comment 1 Capillary Specimen    Comment 2 Notify RN   Heparin level (unfractionated)     Status: Abnormal   Collection Time: 04/05/16  4:20 AM  Result Value Ref Range   Heparin Unfractionated 0.25 (L) 0.30 - 0.70 IU/mL    Comment:        IF HEPARIN RESULTS ARE BELOW EXPECTED VALUES, AND PATIENT DOSAGE HAS BEEN CONFIRMED, SUGGEST FOLLOW UP TESTING OF ANTITHROMBIN III LEVELS.   CBC     Status: None   Collection Time: 04/05/16  4:20 AM  Result Value Ref Range   WBC 10.1 4.0 - 10.5 K/uL   RBC 4.02 3.87 - 5.11 MIL/uL   Hemoglobin 12.9 12.0 - 15.0 g/dL   HCT 38.9 36.0 - 46.0 %   MCV 96.8 78.0 - 100.0 fL   MCH 32.1 26.0 - 34.0 pg   MCHC 33.2 30.0 - 36.0 g/dL   RDW 13.3 11.5 - 15.5 %   Platelets 280 150 - 400 K/uL  Basic metabolic panel     Status: Abnormal   Collection Time: 04/05/16  4:20 AM  Result Value Ref Range   Sodium 137 135 - 145 mmol/L   Potassium 3.7 3.5 - 5.1 mmol/L   Chloride 99 (L) 101 - 111 mmol/L   CO2 28 22 - 32 mmol/L   Glucose, Bld 127 (H) 65 - 99 mg/dL   BUN 18 6 - 20 mg/dL   Creatinine, Ser 0.84 0.44 - 1.00 mg/dL   Calcium 10.6 (H) 8.9 - 10.3 mg/dL   GFR calc non Af Amer >  60 >60 mL/min   GFR calc Af Amer >60 >60 mL/min    Comment: (NOTE) The eGFR has been calculated using  the CKD EPI equation. This calculation has not been validated in all clinical situations. eGFR's persistently <60 mL/min signify possible Chronic Kidney Disease.    Anion gap 10 5 - 15  Protime-INR     Status: Abnormal   Collection Time: 04/05/16  4:20 AM  Result Value Ref Range   Prothrombin Time 18.2 (H) 11.4 - 15.2 seconds   INR 1.49   Glucose, capillary     Status: Abnormal   Collection Time: 04/05/16  8:20 AM  Result Value Ref Range   Glucose-Capillary 138 (H) 65 - 99 mg/dL   Comment 1 Notify RN   Glucose, capillary     Status: Abnormal   Collection Time: 04/05/16 12:06 PM  Result Value Ref Range   Glucose-Capillary 116 (H) 65 - 99 mg/dL   Comment 1 Notify RN   Heparin level (unfractionated)     Status: None   Collection Time: 04/05/16  1:47 PM  Result Value Ref Range   Heparin Unfractionated 0.34 0.30 - 0.70 IU/mL    Comment:        IF HEPARIN RESULTS ARE BELOW EXPECTED VALUES, AND PATIENT DOSAGE HAS BEEN CONFIRMED, SUGGEST FOLLOW UP TESTING OF ANTITHROMBIN III LEVELS.   Glucose, capillary     Status: Abnormal   Collection Time: 04/05/16  4:53 PM  Result Value Ref Range   Glucose-Capillary 116 (H) 65 - 99 mg/dL  Glucose, capillary     Status: Abnormal   Collection Time: 04/05/16  9:13 PM  Result Value Ref Range   Glucose-Capillary 127 (H) 65 - 99 mg/dL   Comment 1 Capillary Specimen   Heparin level (unfractionated)     Status: None   Collection Time: 04/05/16 11:20 PM  Result Value Ref Range   Heparin Unfractionated 0.32 0.30 - 0.70 IU/mL    Comment:        IF HEPARIN RESULTS ARE BELOW EXPECTED VALUES, AND PATIENT DOSAGE HAS BEEN CONFIRMED, SUGGEST FOLLOW UP TESTING OF ANTITHROMBIN III LEVELS.   Heparin level (unfractionated)     Status: None   Collection Time: 04/06/16  2:51 AM  Result Value Ref Range   Heparin Unfractionated 0.32 0.30 - 0.70 IU/mL    Comment:        IF HEPARIN RESULTS ARE BELOW EXPECTED VALUES, AND PATIENT DOSAGE HAS BEEN  CONFIRMED, SUGGEST FOLLOW UP TESTING OF ANTITHROMBIN III LEVELS.   CBC     Status: Abnormal   Collection Time: 04/06/16  2:51 AM  Result Value Ref Range   WBC 8.0 4.0 - 10.5 K/uL   RBC 3.85 (L) 3.87 - 5.11 MIL/uL   Hemoglobin 12.2 12.0 - 15.0 g/dL   HCT 37.1 36.0 - 46.0 %   MCV 96.4 78.0 - 100.0 fL   MCH 31.7 26.0 - 34.0 pg   MCHC 32.9 30.0 - 36.0 g/dL   RDW 13.4 11.5 - 15.5 %   Platelets 259 150 - 400 K/uL  Basic metabolic panel     Status: Abnormal   Collection Time: 04/06/16  2:51 AM  Result Value Ref Range   Sodium 138 135 - 145 mmol/L   Potassium 3.4 (L) 3.5 - 5.1 mmol/L   Chloride 99 (L) 101 - 111 mmol/L   CO2 26 22 - 32 mmol/L   Glucose, Bld 129 (H) 65 - 99 mg/dL   BUN 12 6 - 20 mg/dL  Creatinine, Ser 0.75 0.44 - 1.00 mg/dL   Calcium 10.2 8.9 - 10.3 mg/dL   GFR calc non Af Amer >60 >60 mL/min   GFR calc Af Amer >60 >60 mL/min    Comment: (NOTE) The eGFR has been calculated using the CKD EPI equation. This calculation has not been validated in all clinical situations. eGFR's persistently <60 mL/min signify possible Chronic Kidney Disease.    Anion gap 13 5 - 15  Protime-INR     Status: Abnormal   Collection Time: 04/06/16  2:51 AM  Result Value Ref Range   Prothrombin Time 16.2 (H) 11.4 - 15.2 seconds   INR 1.29     Imaging: No results found.  Assessment:  1. Principal Problem: 2.   Acute on chronic diastolic CHF (congestive heart failure) ([place]) 3. Active Problems: 4.   MR (mitral regurgitation) 5.   Chronic atrial fibrillation (HCC) 6.   Moderate aortic valve stenosis 7.   Elevated troponin 8.   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   Plan:  1. INR 1.29 today - on IV heparin. Plan for LHC. Explained risks/benefits/alternatives to her and she agrees to proceed. Replete potassium this morning. She is requesting eye drops for dry eyes. Likely restart warfarin tonight after her cath.  Time Spent Directly with  Patient:  15 minutes  Length of Stay:  LOS: 3 days   Pixie Casino, MD, [facility] Attending Cardiologist [place] 04/06/2016, 8:43 AM

## 2016-04-06 NOTE — Progress Notes (Signed)
ANTICOAGULATION CONSULT NOTE - Follow Up Consult  Pharmacy Consult for Heparin Indication: atrial fibrillation, r/o ACS  Allergies  Allergen Reactions  . Penicillins Swelling and Rash    Has patient had a PCN reaction causing immediate rash, facial/tongue/throat swelling, SOB or lightheadedness with hypotension: Yes Has patient had a PCN reaction causing severe rash involving mucus membranes or skin necrosis: No Has patient had a PCN reaction that required hospitalization No Has patient had a PCN reaction occurring within the last 10 years: No If all of the above answers are "NO", then may proceed with Cephalosporin use.    Patient Measurements: Height: 5\' 7"  (170.2 cm) Weight: 175 lb 0.7 oz (79.4 kg) IBW/kg (Calculated) : 61.6 Heparin Dosing Weight: 78.3kg  Vital Signs: Temp: 98.3 F (36.8 C) (10/09 0807) Temp Source: Oral (10/09 0807) BP: 155/77 (10/09 0807) Pulse Rate: 93 (10/09 0807)  Labs:  Recent Labs  04/03/16 2202 04/04/16 0310  04/04/16 0859  04/05/16 0420 04/05/16 1347 04/05/16 2320 04/06/16 0251  HGB  --   --   < > 12.7  --  12.9  --   --  12.2  HCT  --   --   --  39.1  --  38.9  --   --  37.1  PLT  --   --   --  248  --  280  --   --  259  LABPROT 26.9*  --   --  21.6*  --  18.2*  --   --  16.2*  INR 2.43  --   --  1.86  --  1.49  --   --  1.29  HEPARINUNFRC  --   --   --   --   < > 0.25* 0.34 0.32 0.32  CREATININE  --   --   --  0.77  --  0.84  --   --  0.75  TROPONINI 0.92* 0.85*  --  0.78*  --   --   --   --   --   < > = values in this interval not displayed.  Estimated Creatinine Clearance: 55.8 mL/min (by C-G formula based on SCr of 0.75 mg/dL).  Assessment: 82 yo F on coumadin PTA for afib - on hold. Pt on heparin bridge for r/o ACS. Heparin level remains at low end of therapeutic after slight rate increase to heparin at 1450 units/hr. No bleeding noted. Plan for cath this AM. CBC wnl.  Goal of Therapy:  Heparin level 0.3-0.7 units/ml Monitor  platelets by anticoagulation protocol: Yes   Plan:  IV heparin at 1450 units/hr Daily heparin level/CBC Monitor for s/sx bleeding F/u plans for resuming Coumadin after cath  Elicia Lamp, PharmD, [facility] Clinical Pharmacist Pager [phone] 04/06/2016 8:10 AM

## 2016-04-06 NOTE — Progress Notes (Signed)
DAILY PROGRESS NOTE  Subjective:  Feels well today. Potassium is slightly low. Total 5.6L negative. Switched to po lasix yesterday.   Objective:  Temp:  [98.3 F (36.8 C)-99.7 F (37.6 C)] 98.3 F (36.8 C) (10/09 0807) Pulse Rate:  [76-93] 93 (10/09 0807) Resp:  [14-25] 21 (10/09 0807) BP: (118-156)/(46-132) 155/77 (10/09 0807) SpO2:  [89 %-98 %] 98 % (10/09 0334) Weight:  [175 lb 0.7 oz (79.4 kg)] 175 lb 0.7 oz (79.4 kg) (10/09 0334) Weight change: -3.5 oz (-0.1 kg)  Intake/Output from previous day: 10/08 0701 - 10/09 0700 In: 734.9 [P.O.:180; I.V.:554.9] Out: 2125 [Urine:2125]  Intake/Output from this shift: No intake/output data recorded.  Medications: No current facility-administered medications on file prior to encounter.    Current Outpatient Prescriptions on File Prior to Encounter  Medication Sig Dispense Refill  . acetaminophen (TYLENOL) 650 MG CR tablet Take 650 mg by mouth every 8 (eight) hours as needed for pain.     . B Complex Vitamins (B COMPLEX-B12) TABS Take 1 tablet by mouth daily.     . Cholecalciferol (VITAMIN D3) 1000 UNITS tablet Take 1,000 Units by mouth daily.      Marland Kitchen diltiazem (CARDIZEM CD) 240 MG 24 hr capsule Take 240 mg by mouth daily.     . DimenhyDRINATE (TRAVEL SICKNESS PO) Take 1 tablet by mouth at bedtime as needed (headache).     . furosemide (LASIX) 40 MG tablet Take 40 mg by mouth Twice daily.     Marland Kitchen gabapentin (NEURONTIN) 600 MG tablet Take 600 mg by mouth 3 (three) times daily as needed (back pain).     . hydrochlorothiazide 25 MG tablet Take 25 mg by mouth daily.     Marland Kitchen lovastatin (MEVACOR) 20 MG tablet Take 20 mg by mouth daily with supper.     . warfarin (COUMADIN) 4 MG tablet Take 4 mg by mouth See admin instructions. Take 1 1/2 tablet (6 mg) by mouth on Tuesdays at 5pm, take 1 tablet (4 mg) on Sunday, Monday, Wednesday, Thursday, Friday, Saturday at 5pm.      Physical Exam: General appearance: alert and no distress Lungs:  clear to auscultation bilaterally Heart: regular rate and rhythm, S1, S2 normal and systolic murmur: systolic ejection 3/6, crescendo at 2nd right intercostal space Extremities: extremities normal, atraumatic, no cyanosis or edema Neurologic: Grossly normal  Lab Results: Results for orders placed or performed during the hospital encounter of 04/03/16 (from the past 48 hour(s))  Troponin I     Status: Abnormal   Collection Time: 04/04/16  8:59 AM  Result Value Ref Range   Troponin I 0.78 (HH) <0.03 ng/mL    Comment: CRITICAL VALUE NOTED.  VALUE IS CONSISTENT WITH PREVIOUSLY REPORTED AND CALLED VALUE.  Basic metabolic panel     Status: Abnormal   Collection Time: 04/04/16  8:59 AM  Result Value Ref Range   Sodium 139 135 - 145 mmol/L   Potassium 3.5 3.5 - 5.1 mmol/L   Chloride 100 (L) 101 - 111 mmol/L   CO2 29 22 - 32 mmol/L   Glucose, Bld 121 (H) 65 - 99 mg/dL   BUN 13 6 - 20 mg/dL   Creatinine, Ser 0.77 0.44 - 1.00 mg/dL   Calcium 10.5 (H) 8.9 - 10.3 mg/dL   GFR calc non Af Amer >60 >60 mL/min   GFR calc Af Amer >60 >60 mL/min    Comment: (NOTE) The eGFR has been calculated using the CKD EPI equation. This calculation  has not been validated in all clinical situations. eGFR's persistently <60 mL/min signify possible Chronic Kidney Disease.    Anion gap 10 5 - 15  CBC     Status: None   Collection Time: 04/04/16  8:59 AM  Result Value Ref Range   WBC 8.3 4.0 - 10.5 K/uL   RBC 4.01 3.87 - 5.11 MIL/uL   Hemoglobin 12.7 12.0 - 15.0 g/dL   HCT 39.1 36.0 - 46.0 %   MCV 97.5 78.0 - 100.0 fL   MCH 31.7 26.0 - 34.0 pg   MCHC 32.5 30.0 - 36.0 g/dL   RDW 13.6 11.5 - 15.5 %   Platelets 248 150 - 400 K/uL  Protime-INR     Status: Abnormal   Collection Time: 04/04/16  8:59 AM  Result Value Ref Range   Prothrombin Time 21.6 (H) 11.4 - 15.2 seconds   INR 1.86   Glucose, capillary     Status: Abnormal   Collection Time: 04/04/16 11:33 AM  Result Value Ref Range   Glucose-Capillary  149 (H) 65 - 99 mg/dL   Comment 1 Notify RN   Glucose, capillary     Status: Abnormal   Collection Time: 04/04/16  4:18 PM  Result Value Ref Range   Glucose-Capillary 140 (H) 65 - 99 mg/dL   Comment 1 Notify RN   Heparin level (unfractionated)     Status: Abnormal   Collection Time: 04/04/16  8:54 PM  Result Value Ref Range   Heparin Unfractionated 0.15 (L) 0.30 - 0.70 IU/mL    Comment:        IF HEPARIN RESULTS ARE BELOW EXPECTED VALUES, AND PATIENT DOSAGE HAS BEEN CONFIRMED, SUGGEST FOLLOW UP TESTING OF ANTITHROMBIN III LEVELS.   Glucose, capillary     Status: Abnormal   Collection Time: 04/04/16  9:48 PM  Result Value Ref Range   Glucose-Capillary 128 (H) 65 - 99 mg/dL   Comment 1 Capillary Specimen    Comment 2 Notify RN   Heparin level (unfractionated)     Status: Abnormal   Collection Time: 04/05/16  4:20 AM  Result Value Ref Range   Heparin Unfractionated 0.25 (L) 0.30 - 0.70 IU/mL    Comment:        IF HEPARIN RESULTS ARE BELOW EXPECTED VALUES, AND PATIENT DOSAGE HAS BEEN CONFIRMED, SUGGEST FOLLOW UP TESTING OF ANTITHROMBIN III LEVELS.   CBC     Status: None   Collection Time: 04/05/16  4:20 AM  Result Value Ref Range   WBC 10.1 4.0 - 10.5 K/uL   RBC 4.02 3.87 - 5.11 MIL/uL   Hemoglobin 12.9 12.0 - 15.0 g/dL   HCT 38.9 36.0 - 46.0 %   MCV 96.8 78.0 - 100.0 fL   MCH 32.1 26.0 - 34.0 pg   MCHC 33.2 30.0 - 36.0 g/dL   RDW 13.3 11.5 - 15.5 %   Platelets 280 150 - 400 K/uL  Basic metabolic panel     Status: Abnormal   Collection Time: 04/05/16  4:20 AM  Result Value Ref Range   Sodium 137 135 - 145 mmol/L   Potassium 3.7 3.5 - 5.1 mmol/L   Chloride 99 (L) 101 - 111 mmol/L   CO2 28 22 - 32 mmol/L   Glucose, Bld 127 (H) 65 - 99 mg/dL   BUN 18 6 - 20 mg/dL   Creatinine, Ser 0.84 0.44 - 1.00 mg/dL   Calcium 10.6 (H) 8.9 - 10.3 mg/dL   GFR calc non Af Amer >  60 >60 mL/min   GFR calc Af Amer >60 >60 mL/min    Comment: (NOTE) The eGFR has been calculated using  the CKD EPI equation. This calculation has not been validated in all clinical situations. eGFR's persistently <60 mL/min signify possible Chronic Kidney Disease.    Anion gap 10 5 - 15  Protime-INR     Status: Abnormal   Collection Time: 04/05/16  4:20 AM  Result Value Ref Range   Prothrombin Time 18.2 (H) 11.4 - 15.2 seconds   INR 1.49   Glucose, capillary     Status: Abnormal   Collection Time: 04/05/16  8:20 AM  Result Value Ref Range   Glucose-Capillary 138 (H) 65 - 99 mg/dL   Comment 1 Notify RN   Glucose, capillary     Status: Abnormal   Collection Time: 04/05/16 12:06 PM  Result Value Ref Range   Glucose-Capillary 116 (H) 65 - 99 mg/dL   Comment 1 Notify RN   Heparin level (unfractionated)     Status: None   Collection Time: 04/05/16  1:47 PM  Result Value Ref Range   Heparin Unfractionated 0.34 0.30 - 0.70 IU/mL    Comment:        IF HEPARIN RESULTS ARE BELOW EXPECTED VALUES, AND PATIENT DOSAGE HAS BEEN CONFIRMED, SUGGEST FOLLOW UP TESTING OF ANTITHROMBIN III LEVELS.   Glucose, capillary     Status: Abnormal   Collection Time: 04/05/16  4:53 PM  Result Value Ref Range   Glucose-Capillary 116 (H) 65 - 99 mg/dL  Glucose, capillary     Status: Abnormal   Collection Time: 04/05/16  9:13 PM  Result Value Ref Range   Glucose-Capillary 127 (H) 65 - 99 mg/dL   Comment 1 Capillary Specimen   Heparin level (unfractionated)     Status: None   Collection Time: 04/05/16 11:20 PM  Result Value Ref Range   Heparin Unfractionated 0.32 0.30 - 0.70 IU/mL    Comment:        IF HEPARIN RESULTS ARE BELOW EXPECTED VALUES, AND PATIENT DOSAGE HAS BEEN CONFIRMED, SUGGEST FOLLOW UP TESTING OF ANTITHROMBIN III LEVELS.   Heparin level (unfractionated)     Status: None   Collection Time: 04/06/16  2:51 AM  Result Value Ref Range   Heparin Unfractionated 0.32 0.30 - 0.70 IU/mL    Comment:        IF HEPARIN RESULTS ARE BELOW EXPECTED VALUES, AND PATIENT DOSAGE HAS BEEN  CONFIRMED, SUGGEST FOLLOW UP TESTING OF ANTITHROMBIN III LEVELS.   CBC     Status: Abnormal   Collection Time: 04/06/16  2:51 AM  Result Value Ref Range   WBC 8.0 4.0 - 10.5 K/uL   RBC 3.85 (L) 3.87 - 5.11 MIL/uL   Hemoglobin 12.2 12.0 - 15.0 g/dL   HCT 37.1 36.0 - 46.0 %   MCV 96.4 78.0 - 100.0 fL   MCH 31.7 26.0 - 34.0 pg   MCHC 32.9 30.0 - 36.0 g/dL   RDW 13.4 11.5 - 15.5 %   Platelets 259 150 - 400 K/uL  Basic metabolic panel     Status: Abnormal   Collection Time: 04/06/16  2:51 AM  Result Value Ref Range   Sodium 138 135 - 145 mmol/L   Potassium 3.4 (L) 3.5 - 5.1 mmol/L   Chloride 99 (L) 101 - 111 mmol/L   CO2 26 22 - 32 mmol/L   Glucose, Bld 129 (H) 65 - 99 mg/dL   BUN 12 6 - 20 mg/dL  Creatinine, Ser 0.75 0.44 - 1.00 mg/dL   Calcium 10.2 8.9 - 10.3 mg/dL   GFR calc non Af Amer >60 >60 mL/min   GFR calc Af Amer >60 >60 mL/min    Comment: (NOTE) The eGFR has been calculated using the CKD EPI equation. This calculation has not been validated in all clinical situations. eGFR's persistently <60 mL/min signify possible Chronic Kidney Disease.    Anion gap 13 5 - 15  Protime-INR     Status: Abnormal   Collection Time: 04/06/16  2:51 AM  Result Value Ref Range   Prothrombin Time 16.2 (H) 11.4 - 15.2 seconds   INR 1.29     Imaging: No results found.  Assessment:  1. Principal Problem: 2.   Acute on chronic diastolic CHF (congestive heart failure) ([place]) 3. Active Problems: 4.   MR (mitral regurgitation) 5.   Chronic atrial fibrillation (HCC) 6.   Moderate aortic valve stenosis 7.   Elevated troponin 8.   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   Plan:  1. INR 1.29 today - on IV heparin. Plan for LHC. Explained risks/benefits/alternatives to her and she agrees to proceed. Replete potassium this morning. She is requesting eye drops for dry eyes. Likely restart warfarin tonight after her cath.  Time Spent Directly with  Patient:  15 minutes  Length of Stay:  LOS: 3 days   Pixie Casino, MD, [facility] Attending Cardiologist [place] 04/06/2016, 8:43 AM

## 2016-04-07 ENCOUNTER — Encounter (HOSPITAL_COMMUNITY): Payer: Self-pay | Admitting: Interventional Cardiology

## 2016-04-07 ENCOUNTER — Telehealth: Payer: Self-pay | Admitting: Cardiology

## 2016-04-07 LAB — CBC
HCT: 37.1 % (ref 36.0–46.0)
Hemoglobin: 11.9 g/dL — ABNORMAL LOW (ref 12.0–15.0)
MCH: 31.3 pg (ref 26.0–34.0)
MCHC: 32.1 g/dL (ref 30.0–36.0)
MCV: 97.6 fL (ref 78.0–100.0)
PLATELETS: 285 10*3/uL (ref 150–400)
RBC: 3.8 MIL/uL — AB (ref 3.87–5.11)
RDW: 13.6 % (ref 11.5–15.5)
WBC: 6.9 10*3/uL (ref 4.0–10.5)

## 2016-04-07 LAB — BASIC METABOLIC PANEL
Anion gap: 10 (ref 5–15)
BUN: 8 mg/dL (ref 6–20)
CALCIUM: 10.5 mg/dL — AB (ref 8.9–10.3)
CO2: 27 mmol/L (ref 22–32)
CREATININE: 0.69 mg/dL (ref 0.44–1.00)
Chloride: 103 mmol/L (ref 101–111)
GFR calc Af Amer: >60 mL/min (ref >60)
GLUCOSE: 149 mg/dL — AB (ref 65–99)
POTASSIUM: 3.9 mmol/L (ref 3.5–5.1)
SODIUM: 140 mmol/L (ref 135–145)

## 2016-04-07 LAB — GLUCOSE, CAPILLARY
GLUCOSE-CAPILLARY: 131 mg/dL — AB (ref 65–99)
Glucose-Capillary: 135 mg/dL — ABNORMAL HIGH (ref 65–99)

## 2016-04-07 LAB — PROTIME-INR
INR: 1.25
Prothrombin Time: 15.8 seconds — ABNORMAL HIGH (ref 11.4–15.2)

## 2016-04-07 MED ORDER — ASPIRIN 81 MG PO TBEC: 81.0000 mg | DELAYED_RELEASE_TABLET | Freq: Every day | ORAL | Status: DC

## 2016-04-07 MED ORDER — METOPROLOL SUCCINATE ER 25 MG PO TB24
25.0000 mg | ORAL_TABLET | Freq: Every day | ORAL | Status: DC
  Administered 2016-04-07: 25 mg via ORAL
  Filled 2016-04-07: qty 1

## 2016-04-07 MED ORDER — NITROGLYCERIN 0.4 MG SL SUBL: 0.4000 mg | SUBLINGUAL_TABLET | SUBLINGUAL | 3 refills | Status: DC | PRN

## 2016-04-07 MED ORDER — FUROSEMIDE 40 MG PO TABS: 60.0000 mg | ORAL_TABLET | Freq: Every day | ORAL | Status: DC

## 2016-04-07 MED ORDER — FUROSEMIDE 40 MG PO TABS: 60.0000 mg | ORAL_TABLET | Freq: Every day | ORAL | 6 refills | Status: DC

## 2016-04-07 MED ORDER — WARFARIN SODIUM 2 MG PO TABS
6.0000 mg | ORAL_TABLET | Freq: Once | ORAL | Status: AC
  Administered 2016-04-07: 6 mg via ORAL
  Filled 2016-04-07: qty 3

## 2016-04-07 MED ORDER — FUROSEMIDE 40 MG PO TABS: 40.0000 mg | ORAL_TABLET | Freq: Every day | ORAL | Status: DC

## 2016-04-07 MED ORDER — METOPROLOL SUCCINATE ER 25 MG PO TB24: 25.0000 mg | ORAL_TABLET | Freq: Every day | ORAL | 6 refills | Status: DC

## 2016-04-07 NOTE — Progress Notes (Signed)
DAILY PROGRESS NOTE  Subjective:  Feels well today. Cath yesterday showed 2 calcified lesions of the distal LCx and mid LAD of moderate stenosis. LVEF by visual estimate was 50-55%. She is diuresing significantly on lasix 80 mg BID.  Objective:  Temp:  [97.3 F (36.3 C)-98.3 F (36.8 C)] 98.1 F (36.7 C) (10/10 0801) Pulse Rate:  [0-149] 54 (10/10 0332) Resp:  [0-61] 17 (10/10 0801) BP: (106-180)/(44-107) 155/107 (10/10 0801) SpO2:  [0 %-97 %] 94 % (10/10 0801) Weight:  [172 lb 3.2 oz (78.1 kg)] 172 lb 3.2 oz (78.1 kg) (10/10 0321) Weight change: -2 lb 13.5 oz (-1.29 kg)  Intake/Output from previous day: 10/09 0701 - 10/10 0700 In: 1727 [P.O.:380; I.V.:1347] Out: 3535 [Urine:3535]  Intake/Output from this shift: Total I/O In: 418 [P.O.:360; I.V.:58] Out: -   Medications: No current facility-administered medications on file prior to encounter.    Current Outpatient Prescriptions on File Prior to Encounter  Medication Sig Dispense Refill  . acetaminophen (TYLENOL) 650 MG CR tablet Take 650 mg by mouth every 8 (eight) hours as needed for pain.     . B Complex Vitamins (B COMPLEX-B12) TABS Take 1 tablet by mouth daily.     . Cholecalciferol (VITAMIN D3) 1000 UNITS tablet Take 1,000 Units by mouth daily.      Marland Kitchen diltiazem (CARDIZEM CD) 240 MG 24 hr capsule Take 240 mg by mouth daily.     . DimenhyDRINATE (TRAVEL SICKNESS PO) Take 1 tablet by mouth at bedtime as needed (headache).     . furosemide (LASIX) 40 MG tablet Take 40 mg by mouth Twice daily.     Marland Kitchen gabapentin (NEURONTIN) 600 MG tablet Take 600 mg by mouth 3 (three) times daily as needed (back pain).     . hydrochlorothiazide 25 MG tablet Take 25 mg by mouth daily.     Marland Kitchen lovastatin (MEVACOR) 20 MG tablet Take 20 mg by mouth daily with supper.     . warfarin (COUMADIN) 4 MG tablet Take 4 mg by mouth See admin instructions. Take 1 1/2 tablet (6 mg) by mouth on Tuesdays at 5pm, take 1 tablet (4 mg) on Sunday, Monday,  Wednesday, Thursday, Friday, Saturday at 5pm.      Physical Exam: General appearance: alert and no distress Lungs: clear to auscultation bilaterally Heart: regular rate and rhythm, S1, S2 normal and systolic murmur: systolic ejection 3/6, crescendo at 2nd right intercostal space Extremities: extremities normal, atraumatic, no cyanosis or edema Neurologic: Grossly normal  Lab Results: Results for orders placed or performed during the hospital encounter of 04/03/16 (from the past 48 hour(s))  Glucose, capillary     Status: Abnormal   Collection Time: 04/05/16 12:06 PM  Result Value Ref Range   Glucose-Capillary 116 (H) 65 - 99 mg/dL   Comment 1 Notify RN   Heparin level (unfractionated)     Status: None   Collection Time: 04/05/16  1:47 PM  Result Value Ref Range   Heparin Unfractionated 0.34 0.30 - 0.70 IU/mL    Comment:        IF HEPARIN RESULTS ARE BELOW EXPECTED VALUES, AND PATIENT DOSAGE HAS BEEN CONFIRMED, SUGGEST FOLLOW UP TESTING OF ANTITHROMBIN III LEVELS.   Glucose, capillary     Status: Abnormal   Collection Time: 04/05/16  4:53 PM  Result Value Ref Range   Glucose-Capillary 116 (H) 65 - 99 mg/dL  Glucose, capillary     Status: Abnormal   Collection Time: 04/05/16  9:13 PM  Result Value Ref Range   Glucose-Capillary 127 (H) 65 - 99 mg/dL   Comment 1 Capillary Specimen   Heparin level (unfractionated)     Status: None   Collection Time: 04/05/16 11:20 PM  Result Value Ref Range   Heparin Unfractionated 0.32 0.30 - 0.70 IU/mL    Comment:        IF HEPARIN RESULTS ARE BELOW EXPECTED VALUES, AND PATIENT DOSAGE HAS BEEN CONFIRMED, SUGGEST FOLLOW UP TESTING OF ANTITHROMBIN III LEVELS.   Heparin level (unfractionated)     Status: None   Collection Time: 04/06/16  2:51 AM  Result Value Ref Range   Heparin Unfractionated 0.32 0.30 - 0.70 IU/mL    Comment:        IF HEPARIN RESULTS ARE BELOW EXPECTED VALUES, AND PATIENT DOSAGE HAS BEEN CONFIRMED, SUGGEST FOLLOW  UP TESTING OF ANTITHROMBIN III LEVELS.   CBC     Status: Abnormal   Collection Time: 04/06/16  2:51 AM  Result Value Ref Range   WBC 8.0 4.0 - 10.5 K/uL   RBC 3.85 (L) 3.87 - 5.11 MIL/uL   Hemoglobin 12.2 12.0 - 15.0 g/dL   HCT 09.0 39.3 - 02.7 %   MCV 96.4 78.0 - 100.0 fL   MCH 31.7 26.0 - 34.0 pg   MCHC 32.9 30.0 - 36.0 g/dL   RDW 39.2 30.4 - 26.0 %   Platelets 259 150 - 400 K/uL  Basic metabolic panel     Status: Abnormal   Collection Time: 04/06/16  2:51 AM  Result Value Ref Range   Sodium 138 135 - 145 mmol/L   Potassium 3.4 (L) 3.5 - 5.1 mmol/L   Chloride 99 (L) 101 - 111 mmol/L   CO2 26 22 - 32 mmol/L   Glucose, Bld 129 (H) 65 - 99 mg/dL   BUN 12 6 - 20 mg/dL   Creatinine, Ser 8.60 0.44 - 1.00 mg/dL   Calcium 24.6 8.9 - 31.7 mg/dL   GFR calc non Af Amer >60 >60 mL/min   GFR calc Af Amer >60 >60 mL/min    Comment: (NOTE) The eGFR has been calculated using the CKD EPI equation. This calculation has not been validated in all clinical situations. eGFR's persistently <60 mL/min signify possible Chronic Kidney Disease.    Anion gap 13 5 - 15  Protime-INR     Status: Abnormal   Collection Time: 04/06/16  2:51 AM  Result Value Ref Range   Prothrombin Time 16.2 (H) 11.4 - 15.2 seconds   INR 1.29   Glucose, capillary     Status: Abnormal   Collection Time: 04/06/16  7:38 AM  Result Value Ref Range   Glucose-Capillary 137 (H) 65 - 99 mg/dL   Comment 1 QC Due    Comment 2 Capillary Specimen   Glucose, capillary     Status: Abnormal   Collection Time: 04/06/16 12:10 PM  Result Value Ref Range   Glucose-Capillary 101 (H) 65 - 99 mg/dL   Comment 1 Capillary Specimen   Glucose, capillary     Status: Abnormal   Collection Time: 04/06/16  4:44 PM  Result Value Ref Range   Glucose-Capillary 132 (H) 65 - 99 mg/dL   Comment 1 Capillary Specimen   Glucose, capillary     Status: Abnormal   Collection Time: 04/06/16  9:32 PM  Result Value Ref Range   Glucose-Capillary 133  (H) 65 - 99 mg/dL   Comment 1 Capillary Specimen   Protime-INR     Status:  Abnormal   Collection Time: 04/07/16  3:48 AM  Result Value Ref Range   Prothrombin Time 15.8 (H) 11.4 - 15.2 seconds   INR 1.25   CBC     Status: Abnormal   Collection Time: 04/07/16  3:48 AM  Result Value Ref Range   WBC 6.9 4.0 - 10.5 K/uL   RBC 3.80 (L) 3.87 - 5.11 MIL/uL   Hemoglobin 11.9 (L) 12.0 - 15.0 g/dL   HCT 37.1 36.0 - 46.0 %   MCV 97.6 78.0 - 100.0 fL   MCH 31.3 26.0 - 34.0 pg   MCHC 32.1 30.0 - 36.0 g/dL   RDW 13.6 11.5 - 15.5 %   Platelets 285 150 - 400 K/uL  Basic metabolic panel     Status: Abnormal   Collection Time: 04/07/16  3:48 AM  Result Value Ref Range   Sodium 140 135 - 145 mmol/L   Potassium 3.9 3.5 - 5.1 mmol/L   Chloride 103 101 - 111 mmol/L   CO2 27 22 - 32 mmol/L   Glucose, Bld 149 (H) 65 - 99 mg/dL   BUN 8 6 - 20 mg/dL   Creatinine, Ser 0.69 0.44 - 1.00 mg/dL   Calcium 10.5 (H) 8.9 - 10.3 mg/dL   GFR calc non Af Amer >60 >60 mL/min   GFR calc Af Amer >60 >60 mL/min    Comment: (NOTE) The eGFR has been calculated using the CKD EPI equation. This calculation has not been validated in all clinical situations. eGFR's persistently <60 mL/min signify possible Chronic Kidney Disease.    Anion gap 10 5 - 15  Glucose, capillary     Status: Abnormal   Collection Time: 04/07/16  7:59 AM  Result Value Ref Range   Glucose-Capillary 131 (H) 65 - 99 mg/dL   Comment 1 Notify RN     Imaging: No results found.  Assessment:  Principal Problem:   Acute on chronic diastolic CHF (congestive heart failure) (HCC) Active Problems:   MR (mitral regurgitation)   Chronic atrial fibrillation (HCC)   Moderate aortic valve stenosis   Elevated troponin   Acute combined systolic and diastolic (congestive) hrt fail ([place])   Plan:  1. Appears euvolemic on exam. Feels well. Moderate calcific CAD of the LAD and LCx, not thought to be related to her NSTEMI. Suspect acute predominantly  diastolic CHF. Given significant ongoing diuresis, decrease lasix to 60 mg daily, rather than 40 mg po BID. Add low dose Toprol XL 25 mg daily for additional rate control, PVC's, NSTEMI and low normal LV function. [place] for d/c home today. Follow-up with Dr. Domenic Polite in 7-10 days.  Time Spent Directly with Patient:  15 minutes  Length of Stay:  LOS: 4 days   Pixie Casino, MD, [facility] Attending Cardiologist [place] 04/07/2016, 11:18 AM

## 2016-04-07 NOTE — Care Management Note (Signed)
Case Management Note  Patient Details  Name: Sharon Little MRN: [id number] Date of Birth: 1928/09/19  Subjective/Objective:   CHF                 Action/Plan: Discharge Planning: AVS reviewed: NCM spoke to pt and sister, Sharon Little at bedside. Pt states she is independent prior to admission. She still drives to her appts. Sister available to assist at home as needed. Pt lives in home with her son (handicap). She has RW at home. Able to afford her medication. Medications delivered to home by [facility].   PCP Sharon Ponto MD  Expected Discharge Date:  04/07/2016             Expected Discharge Plan:  Home/Self Care  In-House Referral:  NA  Discharge planning Services  CM Consult  Post Acute Care Choice:  NA Choice offered to:  NA  DME Arranged:  N/A DME Agency:  NA  HH Arranged:  NA HH Agency:  NA  Status of Service:  Completed, signed off  If discussed at [place] of Stay Meetings, dates discussed:    Additional Comments:  Erenest Rasher, RN 04/07/2016, 2:38 PM

## 2016-04-07 NOTE — Care Management Important Message (Signed)
Important Message  Patient Details  Name: Sharon Little MRN: [id number] Date of Birth: 16-Jul-1928   Medicare Important Message Given:  Yes    Erenest Rasher, RN 04/07/2016, 2:24 PM

## 2016-04-07 NOTE — Progress Notes (Signed)
Pt went through discharge teaching.  Pt acknowledged understanding of discharge instructions. Removed both Iv.

## 2016-04-07 NOTE — Progress Notes (Signed)
ANTICOAGULATION CONSULT NOTE - Follow Up Consult  Pharmacy Consult for heparin/coumadin  Indication: atrial fibrillation, r/o ACS  Allergies  Allergen Reactions  . Penicillins Swelling and Rash    Has patient had a PCN reaction causing immediate rash, facial/tongue/throat swelling, SOB or lightheadedness with hypotension: Yes Has patient had a PCN reaction causing severe rash involving mucus membranes or skin necrosis: No Has patient had a PCN reaction that required hospitalization No Has patient had a PCN reaction occurring within the last 10 years: No If all of the above answers are "NO", then may proceed with Cephalosporin use.    Patient Measurements: Height: 5\' 7"  (170.2 cm) Weight: 172 lb 3.2 oz (78.1 kg) IBW/kg (Calculated) : 61.6 Heparin Dosing Weight: 78.3kg  Vital Signs: Temp: 98.1 F (36.7 C) (10/10 0801) Temp Source: Oral (10/10 0801) BP: 155/107 (10/10 0801) Pulse Rate: 54 (10/10 0332)  Labs:  Recent Labs  04/05/16 0420 04/05/16 1347 04/05/16 2320 04/06/16 0251 04/07/16 0348  HGB 12.9  --   --  12.2 11.9*  HCT 38.9  --   --  37.1 37.1  PLT 280  --   --  259 285  LABPROT 18.2*  --   --  16.2* 15.8*  INR 1.49  --   --  1.29 1.25  HEPARINUNFRC 0.25* 0.34 0.32 0.32  --   CREATININE 0.84  --   --  0.75 0.69    Estimated Creatinine Clearance: 55.4 mL/min (by C-G formula based on SCr of 0.69 mg/dL).  Assessment: 82 yo F on coumadin PTA for afib. Pt started on heparin bridge for r/o ACS and now s/p cath with 2 vessel calcified CAD. Heparin restarted per MD 8 hours post sheath removal on 10/9 at previous rate and coumadin resumed. INR stable this AM 1.25 s/p 1 dose of warfarin. CBC stable, no bleed documented.  Heparin d/c'd and patient to go home today on coumadin per Dr. Debara Pickett. No further cath plans.  Goal of Therapy:  Heparin level 0.3-0.7 units/ml Monitor platelets by anticoagulation protocol: Yes   Plan:  -Coumadin 6mg  po x 1 dose tonight -Daily  INR, monitor for s/sx bleeding  Elicia Lamp, PharmD, BCPS Clinical Pharmacist 04/07/2016 10:29 AM

## 2016-04-07 NOTE — Care Management Note (Signed)
Case Management Note  Patient Details  Name: Sharon Little MRN: [id number] Date of Birth: 1929-05-08  Subjective/Objective:   Pt admitted with CHF. She is from home with family. Pt on heparin gtt and will transition back to coumadin.                 Action/Plan: Plan is for patient to return home when medically ready for discharge. CM following for d/c needs.   Expected Discharge Date:                  Expected Discharge Plan:  Home/Self Care  In-House Referral:     Discharge planning Services     Post Acute Care Choice:    Choice offered to:     DME Arranged:    DME Agency:     HH Arranged:    HH Agency:     Status of Service:  In process, will continue to follow  If discussed at Long Length of Stay Meetings, dates discussed:    Additional Comments:  Pollie Friar, RN 04/07/2016, 10:31 AM

## 2016-04-07 NOTE — Discharge Summary (Signed)
Discharge Summary    Patient ID: Sharon Little,  MRN: [id number], DOB/AGE: 80-Feb-1930 80 y.o.  Admit date: 04/03/2016 Discharge date: 04/07/2016  Primary Care Provider: Gar Ponto Primary Cardiologist: Dr. Domenic Polite  Discharge Diagnoses    Principal Problem:   Acute on chronic diastolic CHF (congestive heart failure) ([place]) Active Problems:   MR (mitral regurgitation)   Chronic atrial fibrillation (HCC)   Moderate aortic valve stenosis   Elevated troponin   Acute combined systolic and diastolic (congestive) hrt fail (HCC)   Allergies Allergies  Allergen Reactions  . Penicillins Swelling and Rash    Has patient had a PCN reaction causing immediate rash, facial/tongue/throat swelling, SOB or lightheadedness with hypotension: Yes Has patient had a PCN reaction causing severe rash involving mucus membranes or skin necrosis: No Has patient had a PCN reaction that required hospitalization No Has patient had a PCN reaction occurring within the last 10 years: No If all of the above answers are "NO", then may proceed with Cephalosporin use.    Diagnostic Studies/Procedures    LHC: 10/9  Conclusion     Dist Cx lesion, 50-60% stenosed, eccentric and calcified.  Mid LAD lesion, 50-60% stenosed, severely calcified and eccentric.  The left ventricular ejection fraction is 50-55% by visual estimate.  The left ventricular systolic function is normal.  There is no aortic valve stenosis.  LV end diastolic pressure is normal.   Moderate, calcific 2 vessel coronary artery disease. No clear culprit vessel for an MI. I suspect her troponin elevation was more related to heart failure, especially given the pattern of troponin elevation.  I had a long discussion with the patient's younger sister. The patient is severely limited by chronic back pain. Her most strenuous activity is physical therapy, and occasionally riding a stationary bicycle.  She does not a much attention to the  salt content in her food. She eats out at restaurants fairly frequently.  We also discussed the fact that her disease is quite calcified and could entail rotational atherectomy to fix if it turned out to be significant. The family was in favor of medical therapy as well.  Given all of this information, and the fact that neither lesion appeared to be a thrombotic, plaque rupture type lesion, would advocate medical therapy for diastolic heart failure. She'll need diet control and salt restriction. If she has typical anginal symptoms, could bring her back for FFR. She should be preloaded with Plavix if intervention was to be considered.     Diagnostic Diagram       _____________   History of Present Illness     Sharon Little is a 82 y.o. female with past medical history of chronic atrial fibrillation, HLD, Type 2 DM, known LBBB, and back pain who presents to [facility] on 04/03/2016 as a transfer from [facility].   She initially presented to [facility] on 10/5 for severe sudden onset dyspnea. Was sitting in the car outside a department store when she developed sudden onset dyspnea and felt like she could barely catch her breath. Denies any chest discomfort or palpitations at that time. Upon EMS arrival, oxygen saturations were in the 60's and she was placed on BiPAP (has now been weaned down to [place]).   Initial labs showed a WBC 10.5, Hgb 13.5, and platelets 337. K+ 3.8. Creatinine 0.69. BNP of 725. Initial troponin was negative but cyclic values were [id number] and 0.24  CXR with pulmonary edema. Echocardiogram was performed showing EF  of 50% with no wall motion abnormalities. Mild aortic sclerosis with mild to moderate aortic stenosis was noted. Mild MR, mild TR, and moderate pulmonary HTN with PA Peak pressure of 44 mmHg was present as well.   She was diuresed with IV Lasix 40mg  BID and transferred to [facility] for further treatment of her CHF and elevated troponin values.    During this encounter, she reports her breathing has significantly improved. Still denies any chest discomfort or palpitations. Has occasional lower extremity edema and orthopnea. She is active at baseline, currently living with her son but able to perform ADL's independently and drive herself to various places. She does report worsening dyspnea with exertion for the past 2 months. Notices this when walking around the store or using the stationary bike.   Denies any prior history of CAD. Has known atrial fibrillation and reports good compliance with her medication regimen which includes Cardizem CD 240mg  daily, Lasix 40mg  BID, HCTZ 12.5mg  daily, Lovastatin 20mg  daily, and Coumadin.   Hospital Course     Consultants: None  She was admitted and continued on IV diuresis, and Coumadin was held given the possibility for ischemic workup. The following day she reported a marked improvement in her breathing, and had a total of 3.5 L of urine output overnight. She was continued on IV Lasix for an additional day. Troponin did peak at 0.92. Given her relatively new reduced LV function, along with elevated troponin concern was raised for acute MI or proceeding to flash pulmonary edema/CHF. Also noted on telemetry of having frequent PVCs, therefore decision was made to proceed with LHC.  She remained in A. fib, but rate did improve throughout admission. She was switched to by mouth Lasix on 10/9. She underwent left heart cath with Dr. Irish Lack showing moderate two-vessel coronary disease including the LCx and LAD of moderate stenosis, but no clear culprit for MI. At that time her troponin elevation was suspected to be more related to her heart failure. Discussion was had with her family regarding further treatment and decision was made in favor of medical therapy.   She was observed overnight post procedure without any noted complications. Reported feeling well and noted to be bulimic on exam. Plan will be to  continue on 60 mg by mouth Lasix daily, with the addition of Toprol-XL 25 mg additional rate control, PVCs and low LV function. She diuresed a total of 7 L during this admission, with discharge labs noted as stable. Given the increase in her lasix to 60mg  daily and addition of Toprol XL, will hold HCTZ at discharge.   On 10/10 she was seen and assessed by Dr. Debara Pickett and determined stable for discharge home. Follow up in the [place] office has been arranged.  _____________  Discharge Vitals Blood pressure 115/68, pulse 64, temperature 98.2 F (36.8 C), temperature source Oral, resp. rate 15, height 5\' 7"  (1.702 m), weight 172 lb 3.2 oz (78.1 kg), SpO2 90 %.  Filed Weights   04/05/16 0353 04/06/16 0334 04/07/16 0321  Weight: 175 lb 4.3 oz (79.5 kg) 175 lb 0.7 oz (79.4 kg) 172 lb 3.2 oz (78.1 kg)    Labs & Radiologic Studies    CBC  Recent Labs  04/06/16 0251 04/07/16 0348  WBC 8.0 6.9  HGB 12.2 11.9*  HCT 37.1 37.1  MCV 96.4 97.6  PLT 259 [id number]   Basic Metabolic Panel  Recent Labs  04/06/16 0251 04/07/16 0348  NA 138 140  K 3.4* 3.9  CL  99* 103  CO2 26 27  GLUCOSE 129* 149*  BUN 12 8  CREATININE 0.75 0.69  CALCIUM 10.2 10.5*   Liver Function Tests No results for input(s): AST, ALT, ALKPHOS, BILITOT, PROT, ALBUMIN in the last 72 hours. No results for input(s): LIPASE, AMYLASE in the last 72 hours. Cardiac Enzymes No results for input(s): CKTOTAL, CKMB, CKMBINDEX, TROPONINI in the last 72 hours. BNP Invalid input(s): POCBNP D-Dimer No results for input(s): DDIMER in the last 72 hours. Hemoglobin A1C No results for input(s): HGBA1C in the last 72 hours. Fasting Lipid Panel No results for input(s): CHOL, HDL, LDLCALC, TRIG, CHOLHDL, LDLDIRECT in the last 72 hours. Thyroid Function Tests No results for input(s): TSH, T4TOTAL, T3FREE, THYROIDAB in the last 72 hours.  Invalid input(s): FREET3 _____________  No results found. Disposition   Pt is being discharged  home today in good condition.  Follow-up Plans & Appointments    Follow-up Information    Ermalinda Barrios, PA-C Follow up on 04/15/2016.   Specialty:  Cardiology Why:  at 2pm for your hospital follow up with Dr. Myles Gip PA Nigel Berthold information: [place] [id number] [phone]          Discharge Instructions    Diet - low sodium heart healthy    Complete by:  As directed    Discharge instructions    Complete by:  As directed    Radial Site Care Refer to this sheet in the next few weeks. These instructions provide you with information on caring for yourself after your procedure. Your caregiver may also give you more specific instructions. Your treatment has been planned according to current medical practices, but problems sometimes occur. Call your caregiver if you have any problems or questions after your procedure. HOME CARE INSTRUCTIONS You may shower the day after the procedure.Remove the bandage (dressing) and gently wash the site with plain soap and water.Gently pat the site dry.  Do not apply powder or lotion to the site.  Do not submerge the affected site in water for 3 to 5 days.  Inspect the site at least twice daily.  Do not flex or bend the affected arm for 24 hours.  No lifting over 5 pounds (2.3 kg) for 5 days after your procedure.  Do not drive home if you are discharged the same day of the procedure. Have someone else drive you.  You may drive 24 hours after the procedure unless otherwise instructed by your caregiver.  What to expect: Any bruising will usually fade within 1 to 2 weeks.  Blood that collects in the tissue (hematoma) may be painful to the touch. It should usually decrease in size and tenderness within 1 to 2 weeks.  SEEK IMMEDIATE MEDICAL CARE IF: You have unusual pain at the radial site.  You have redness, warmth, swelling, or pain at the radial site.  You have drainage (other than a small amount of blood on the dressing).   You have chills.  You have a fever or persistent symptoms for more than 72 hours.  You have a fever and your symptoms suddenly get worse.  Your arm becomes pale, cool, tingly, or numb.  You have heavy bleeding from the site. Hold pressure on the site.   Increase activity slowly    Complete by:  As directed       Discharge Medications   Current Discharge Medication List    START taking these medications   Details  aspirin EC 81 MG  EC tablet Take 1 tablet (81 mg total) by mouth daily.    metoprolol succinate (TOPROL-XL) 25 MG 24 hr tablet Take 1 tablet (25 mg total) by mouth daily. Qty: 30 tablet, Refills: 6    nitroGLYCERIN (NITROSTAT) 0.4 MG SL tablet Place 1 tablet (0.4 mg total) under the tongue every 5 (five) minutes x 3 doses as needed for chest pain. Qty: 25 tablet, Refills: 3      CONTINUE these medications which have CHANGED   Details  furosemide (LASIX) 40 MG tablet Take 1.5 tablets (60 mg total) by mouth daily. Qty: 30 tablet, Refills: 6      CONTINUE these medications which have NOT CHANGED   Details  acetaminophen (TYLENOL) 650 MG CR tablet Take 650 mg by mouth every 8 (eight) hours as needed for pain.     B Complex Vitamins (B COMPLEX-B12) TABS Take 1 tablet by mouth daily.     Cholecalciferol (VITAMIN D3) 1000 UNITS tablet Take 1,000 Units by mouth daily.      diltiazem (CARDIZEM CD) 240 MG 24 hr capsule Take 240 mg by mouth daily.     DimenhyDRINATE (TRAVEL SICKNESS PO) Take 1 tablet by mouth at bedtime as needed (headache).     gabapentin (NEURONTIN) 600 MG tablet Take 600 mg by mouth 3 (three) times daily as needed (back pain).     lovastatin (MEVACOR) 20 MG tablet Take 20 mg by mouth daily with supper.     metFORMIN (GLUCOPHAGE-XR) 500 MG 24 hr tablet Take 500 mg by mouth 2 (two) times daily. Refills: 1    Multiple Vitamin (MULTIVITAMIN WITH MINERALS) TABS tablet Take 1 tablet by mouth daily.    OVER THE COUNTER MEDICATION Place 1 drop into both  eyes daily. Over the counter lubricating eye drop    ranitidine (ZANTAC) 150 MG tablet Take 150 mg by mouth 2 (two) times daily as needed for heartburn.  Refills: 1    traMADol (ULTRAM) 50 MG tablet Take 50 mg by mouth every 6 (six) hours as needed (pain).    warfarin (COUMADIN) 4 MG tablet Take 4 mg by mouth See admin instructions. Take 1 1/2 tablet (6 mg) by mouth on Tuesdays at 5pm, take 1 tablet (4 mg) on Sunday, Monday, Wednesday, Thursday, Friday, Saturday at [place] taking these medications     hydrochlorothiazide 25 MG tablet           Outstanding Labs/Studies   None  Duration of Discharge Encounter   Greater than 30 minutes including physician time.  Signed, Latricia Vallance NP-C 04/07/2016, 1:43 PM\

## 2016-04-07 NOTE — Telephone Encounter (Signed)
TOC dsch from [facility] 04/07/16   Schedule to see M. Bonnell Public 04/15/16

## 2016-04-08 NOTE — Telephone Encounter (Signed)
Sharon Little called the office stating that she was not given an RX for Tramadol 50mg .  [facility] ,

## 2016-04-08 NOTE — Telephone Encounter (Signed)
Appt made for 04/14/16 at 3pm.  Pt aware.

## 2016-04-08 NOTE — Telephone Encounter (Signed)
Patient contacted regarding discharge from [facility] on 04/07/2016.    Patient understands to follow up with Estella Husk, PA on Wednesday, 04/15/2016 at 2:00 in [place] office.   Patient understands discharge instructions?  Yes  Patient understands medications and regiment?  Yes  Patient understands to bring all medications to this visit?  Yes

## 2016-04-08 NOTE — Telephone Encounter (Signed)
Mrs. Imholte called wanting to let Edrick Oh know that she had been in the hospital.  She missed her coumdin check on 04/07/16. Will have Lattie Haw call patient to let her know when she needs to come back.

## 2016-04-08 NOTE — Telephone Encounter (Signed)
Spoke with sister Stanton Kidney) regarding the Tramadol prescription.  Review of discharge summary does not look like they were giving her prescription for this upon her discharge.  This medication was on her list prior to that hospital visit as well.  Our office has never filled this for her.  Sister stated that she has lots of back / arthritic pain & she will call her family doctor for this .

## 2016-04-14 ENCOUNTER — Ambulatory Visit (INDEPENDENT_AMBULATORY_CARE_PROVIDER_SITE_OTHER): Payer: Medicare Other | Admitting: *Deleted

## 2016-04-14 DIAGNOSIS — Z5181 Encounter for therapeutic drug level monitoring: Secondary | ICD-10-CM

## 2016-04-14 LAB — POCT INR: INR: 1.8

## 2016-04-15 ENCOUNTER — Encounter: Payer: Self-pay | Admitting: Physician Assistant

## 2016-04-15 ENCOUNTER — Ambulatory Visit (INDEPENDENT_AMBULATORY_CARE_PROVIDER_SITE_OTHER): Payer: Medicare Other | Admitting: Physician Assistant

## 2016-04-15 ENCOUNTER — Other Ambulatory Visit (HOSPITAL_COMMUNITY)
Admission: RE | Admit: 2016-04-15 | Discharge: 2016-04-15 | Disposition: A | Discharge status: Home / Self Care | Payer: Medicare Other | Source: Ambulatory Visit | Attending: Physician Assistant | Admitting: Physician Assistant

## 2016-04-15 VITALS — BP 130/74 | HR 50 | Ht 67.0 in | Wt 170.0 lb

## 2016-04-15 DIAGNOSIS — E785 Hyperlipidemia, unspecified: Secondary | ICD-10-CM

## 2016-04-15 DIAGNOSIS — Z79899 Other long term (current) drug therapy: Secondary | ICD-10-CM

## 2016-04-15 DIAGNOSIS — I4819 Other persistent atrial fibrillation: Secondary | ICD-10-CM

## 2016-04-15 DIAGNOSIS — I481 Persistent atrial fibrillation: Secondary | ICD-10-CM | POA: Diagnosis not present

## 2016-04-15 DIAGNOSIS — I5032 Chronic diastolic (congestive) heart failure: Secondary | ICD-10-CM

## 2016-04-15 DIAGNOSIS — I350 Nonrheumatic aortic (valve) stenosis: Secondary | ICD-10-CM

## 2016-04-15 LAB — BASIC METABOLIC PANEL
Anion gap: 8 (ref 5–15)
BUN: 22 mg/dL — AB (ref 6–20)
CHLORIDE: 99 mmol/L — AB (ref 101–111)
CO2: 29 mmol/L (ref 22–32)
Calcium: 10.6 mg/dL — ABNORMAL HIGH (ref 8.9–10.3)
Creatinine, Ser: 0.85 mg/dL (ref 0.44–1.00)
GFR calc Af Amer: >60 mL/min (ref >60)
GFR calc non Af Amer: >60 mL/min (ref >60)
GLUCOSE: 113 mg/dL — AB (ref 65–99)
POTASSIUM: 4.5 mmol/L (ref 3.5–5.1)
Sodium: 136 mmol/L (ref 135–145)

## 2016-04-15 NOTE — Patient Instructions (Signed)
Your physician recommends that you schedule a follow-up appointment in: 2 Months with Dr. Domenic Polite in [place] recommends that you return for lab work in: Today  Your physician recommends that you continue on your current medications as directed. Please refer to the Current Medication list given to you today.  If you need a refill on your cardiac medications before your next appointment, please call your pharmacy.  Thank you for choosing [facility]!

## 2016-04-15 NOTE — Progress Notes (Signed)
Cardiology Office Note    Date:  04/15/2016   ID:  Sharon Little, DOB Aug 13, 1928, MRN [id number]  PCP:  Sharon Ponto, MD  Cardiologist: Dr. Domenic Polite  Chief Complaint  Patient presents with  . Follow-up    History of Present Illness:  Sharon Little is a 82 y.o. female  with history of chronic atrial fibrillation on Coumadin, chronic dyspnea on exertion, hyperlipidemia who was last seen by Dr. Domenic Polite 03/24/16. He felt her dyspnea on exertion could be related to chronotropic incompetence but she has done well on Cardizem and he was reluctant to change it at that day. He ordered a 2-D echo which is scheduled to be done October 25.  She was admitted to the hospital with acute on chronic CHF and chest pain. Cardiac cath 04/06/16 revealed moderate calcific 2 vessel CAD with no clear culprit vessel for an MI. Her troponins were most likely elevated due to heart failure. It was felt that she could possibly have rotational atherectomy to fix it if she continues to have chest pain but the family was in favor of medical therapy. Patient is very limited because of chronic severe back pain. She does eat a lot of salt as well. If she has refractory pain could bring her back for FFR. She should be preloaded with Plavix if intervention was to be considered.  Patient comes in today accompanied by her sister. Her main complaint today is back pain. She denies any chest pain, dyspnea, dyspnea on exertion, dizziness or presyncope. She is trying to follow low sodium diet but it's been difficult.  Past Medical History:  Diagnosis Date  . Chronic atrial fibrillation ([place])   . Chronic back pain   . Hyperlipidemia   . Leg edema     Past Surgical History:  Procedure Laterality Date  . CARDIAC CATHETERIZATION N/A 04/06/2016   Procedure: Left Heart Cath and Coronary Angiography;  Surgeon: Jettie Booze, MD;  Location: [facility];  Service: Cardiovascular;  Laterality: N/A;    Current  Medications: Outpatient Medications Prior to Visit  Medication Sig Dispense Refill  . acetaminophen (TYLENOL) 650 MG CR tablet Take 650 mg by mouth every 8 (eight) hours as needed for pain.     Marland Kitchen aspirin EC 81 MG EC tablet Take 1 tablet (81 mg total) by mouth daily.    . B Complex Vitamins (B COMPLEX-B12) TABS Take 1 tablet by mouth daily.     . Cholecalciferol (VITAMIN D3) 1000 UNITS tablet Take 1,000 Units by mouth daily.      Marland Kitchen diltiazem (CARDIZEM CD) 240 MG 24 hr capsule Take 240 mg by mouth daily.     . DimenhyDRINATE (TRAVEL SICKNESS PO) Take 1 tablet by mouth at bedtime as needed (headache).     . furosemide (LASIX) 40 MG tablet Take 1.5 tablets (60 mg total) by mouth daily. 30 tablet 6  . gabapentin (NEURONTIN) 600 MG tablet Take 600 mg by mouth 3 (three) times daily as needed (back pain).     Marland Kitchen lovastatin (MEVACOR) 20 MG tablet Take 20 mg by mouth daily with supper.     . metFORMIN (GLUCOPHAGE-XR) 500 MG 24 hr tablet Take 500 mg by mouth 2 (two) times daily.  1  . metoprolol succinate (TOPROL-XL) 25 MG 24 hr tablet Take 1 tablet (25 mg total) by mouth daily. 30 tablet 6  . Multiple Vitamin (MULTIVITAMIN WITH MINERALS) TABS tablet Take 1 tablet by mouth daily.    . nitroGLYCERIN (  NITROSTAT) 0.4 MG SL tablet Place 1 tablet (0.4 mg total) under the tongue every 5 (five) minutes x 3 doses as needed for chest pain. 25 tablet 3  . OVER THE COUNTER MEDICATION Place 1 drop into both eyes daily. Over the counter lubricating eye drop    . ranitidine (ZANTAC) 150 MG tablet Take 150 mg by mouth 2 (two) times daily as needed for heartburn.   1  . traMADol (ULTRAM) 50 MG tablet Take 50 mg by mouth every 6 (six) hours as needed (pain).    Marland Kitchen warfarin (COUMADIN) 4 MG tablet Take 4 mg by mouth See admin instructions. Take 1 1/2 tablet (6 mg) by mouth on Tuesdays at 5pm, take 1 tablet (4 mg) on Sunday, Monday, Wednesday, Thursday, Friday, Saturday at 5pm.     No facility-administered medications prior to  visit.      Allergies:   Penicillins   Social History   Social History  . Marital status: Divorced    Spouse name: N/A  . Number of children: N/A  . Years of education: N/A   Social History Main Topics  . Smoking status: Former Smoker    Packs/day: 2.00    Years: 24.00    Types: Cigarettes    Quit date: 06/29/1968  . Smokeless tobacco: Never Used  . Alcohol use No  . Drug use: No  . Sexual activity: Not Asked   Other Topics Concern  . None   Social History Narrative   Divorced, retired.      Family History:  The patient's   family history includes Colon cancer in her father; Heart disease in her brother, mother, and sister.   ROS:   Please see the history of present illness.    Review of Systems  Constitution: Negative.  HENT: Positive for hearing loss.   Eyes: Negative.   Cardiovascular: Negative.   Respiratory: Negative.   Hematologic/Lymphatic: Negative.   Musculoskeletal: Positive for arthritis, back pain and stiffness. Negative for joint pain.  Gastrointestinal: Negative.   Genitourinary: Negative.   Neurological: Negative.    All other systems reviewed and are negative.   PHYSICAL EXAM:   VS:  BP 130/74   Pulse (!) 50   Ht 5\' 7"  (1.702 m)   Wt 170 lb (77.1 kg)   SpO2 97%   BMI 26.63 kg/m   Physical Exam  GEN: Well nourished, well developed, in no acute distress  Neck: Bilateral carotid bruits, no JVD, or masses Cardiac: Irregular irregular with 2/6 systolic murmur at the left sternal border, no rubs, or gallops  Respiratory:  Decreased breath sounds throughout clear to auscultation bilaterally, normal work of breathing GI: soft, nontender, nondistended, + BS Ext: Right arm at cath site without hematoma or hemorrhage good radial and brachial pulses, otherwise lower extremities without cyanosis, clubbing, or edema, Good distal pulses bilaterally MS: no deformity or atrophy  Skin: warm and dry, no rash Neuro:  Alert and Oriented x 3, Strength and  sensation are intact Psych: euthymic mood, full affect  Wt Readings from Last 3 Encounters:  04/15/16 170 lb (77.1 kg)  04/07/16 172 lb 3.2 oz (78.1 kg)  03/24/16 178 lb 12.8 oz (81.1 kg)      Studies/Labs Reviewed:   EKG:  EKG is Not ordered today.    Recent Labs: 04/07/2016: BUN 8; Creatinine, Ser 0.69; Hemoglobin 11.9; Platelets 285; Potassium 3.9; Sodium 140   Lipid Panel    Component Value Date/Time   CHOL 146 04/04/2016 0310  TRIG 169 (H) 04/04/2016 0310   HDL 38 (L) 04/04/2016 0310   CHOLHDL 3.8 04/04/2016 0310   VLDL 34 04/04/2016 0310   LDLCALC 74 04/04/2016 0310    Additional studies/ records that were reviewed today include:   Conclusion     Dist Cx lesion, 50-60% stenosed, eccentric and calcified.  Mid LAD lesion, 50-60% stenosed, severely calcified and eccentric.  The left ventricular ejection fraction is 50-55% by visual estimate.  The left ventricular systolic function is normal.  There is no aortic valve stenosis.  LV end diastolic pressure is normal.   Moderate, calcific 2 vessel coronary artery disease. No clear culprit vessel for an MI. I suspect her troponin elevation was more related to heart failure, especially given the pattern of troponin elevation.  I had a long discussion with the patient's younger sister. The patient is severely limited by chronic back pain. Her most strenuous activity is physical therapy, and occasionally riding a stationary bicycle.  She does not a much attention to the salt content in her food. She eats out at restaurants fairly frequently.  We also discussed the fact that her disease is quite calcified and could entail rotational atherectomy to fix if it turned out to be significant. The family was in favor of medical therapy as well.   Given all of this information, and the fact that neither lesion appeared to be a thrombotic, plaque rupture type lesion, would advocate medical therapy for diastolic heart failure. She'll  need diet control and salt restriction. If she has typical anginal symptoms, could bring her back for FFR. She should be preloaded with Plavix if intervention was to be considered.   We'll have her follow-up with Dr. Domenic Polite.         ASSESSMENT:    1. Drug therapy   2. Persistent atrial fibrillation ([place])   3. Chronic diastolic CHF (congestive heart failure) ([place])   4. Hyperlipidemia, unspecified hyperlipidemia type   5. Moderate aortic valve stenosis      PLAN:  In order of problems listed above:  Drug therapy will check renal function today on current dose of Lasix  Persistent atrial fibrillation on metoprolol 25 mg daily and diltiazem 240 mg daily, rate controlled also on Coumadin  Chronic diastolic CHF compensated: Doing well on current dose Lasix and trying to fall low-sodium diet. 2 g sodium diet given. Normal LVEF on cardiac cath. Patient is scheduled for 2-D echo October 25. Follow-up with Dr. Domenic Polite in [place] in 2 months.  Hyperlipidemia continue lovastatin.  Systolic murmur consistent with aortic valve stenosis weight 2-D echo.  Medication Adjustments/Labs and Tests Ordered: Current medicines are reviewed at length with the patient today.  Concerns regarding medicines are outlined above.  Medication changes, Labs and Tests ordered today are listed in the Patient Instructions below. Patient Instructions  Your physician recommends that you schedule a follow-up appointment in: 2 Months with Dr. Domenic Polite in [place] recommends that you return for lab work in: Today  Your physician recommends that you continue on your current medications as directed. Please refer to the Current Medication list given to you today.  If you need a refill on your cardiac medications before your next appointment, please call your pharmacy.  Thank you for choosing [facility]!        Signed, Sharon Barrios, PA-C  04/15/2016 3:03 PM    [facility]  [facility], [place], [place]  [address] Phone: [phone]; Fax: [phone]    

## 2016-04-22 ENCOUNTER — Ambulatory Visit (INDEPENDENT_AMBULATORY_CARE_PROVIDER_SITE_OTHER): Payer: Medicare Other

## 2016-04-22 ENCOUNTER — Other Ambulatory Visit: Payer: Self-pay

## 2016-04-22 DIAGNOSIS — I482 Chronic atrial fibrillation, unspecified: Secondary | ICD-10-CM

## 2016-04-23 ENCOUNTER — Telehealth: Payer: Self-pay | Admitting: *Deleted

## 2016-04-23 NOTE — Telephone Encounter (Signed)
-----   Message from Massie Maroon, [place] sent at 04/23/2016 10:22 AM EDT -----   ----- Message ----- From: Satira Sark, MD Sent: 04/22/2016   1:47 PM To: Merlene Laughter, LPN  Results reviewed. LVEF is in normal range of 55-60% which is reassuring. She does have mild to moderate aortic stenosis, but would not necessarily expect this to be symptomatic at this time. We will continue with current follow-up plan. A copy of this test should be forwarded to Gar Ponto, MD.

## 2016-04-23 NOTE — Telephone Encounter (Signed)
Pt aware - routed to pcp  

## 2016-04-28 ENCOUNTER — Ambulatory Visit (INDEPENDENT_AMBULATORY_CARE_PROVIDER_SITE_OTHER): Payer: Medicare Other | Admitting: *Deleted

## 2016-04-28 DIAGNOSIS — Z5181 Encounter for therapeutic drug level monitoring: Secondary | ICD-10-CM

## 2016-04-28 LAB — POCT INR: INR: 2.5

## 2016-05-03 DIAGNOSIS — I5032 Chronic diastolic (congestive) heart failure: Secondary | ICD-10-CM | POA: Diagnosis not present

## 2016-05-03 DIAGNOSIS — I2510 Atherosclerotic heart disease of native coronary artery without angina pectoris: Secondary | ICD-10-CM | POA: Diagnosis not present

## 2016-05-03 DIAGNOSIS — Z6827 Body mass index (BMI) 27.0-27.9, adult: Secondary | ICD-10-CM | POA: Diagnosis not present

## 2016-05-03 DIAGNOSIS — I482 Chronic atrial fibrillation: Secondary | ICD-10-CM | POA: Diagnosis not present

## 2016-05-18 DIAGNOSIS — H26493 Other secondary cataract, bilateral: Secondary | ICD-10-CM | POA: Diagnosis not present

## 2016-05-18 DIAGNOSIS — Z7984 Long term (current) use of oral hypoglycemic drugs: Secondary | ICD-10-CM | POA: Diagnosis not present

## 2016-05-18 DIAGNOSIS — E119 Type 2 diabetes mellitus without complications: Secondary | ICD-10-CM | POA: Diagnosis not present

## 2016-05-18 DIAGNOSIS — Z961 Presence of intraocular lens: Secondary | ICD-10-CM | POA: Diagnosis not present

## 2016-05-26 ENCOUNTER — Ambulatory Visit (INDEPENDENT_AMBULATORY_CARE_PROVIDER_SITE_OTHER): Payer: Medicare Other | Admitting: *Deleted

## 2016-05-26 DIAGNOSIS — Z5181 Encounter for therapeutic drug level monitoring: Secondary | ICD-10-CM | POA: Diagnosis not present

## 2016-05-26 LAB — POCT INR: INR: 1.8

## 2016-06-08 DIAGNOSIS — E1151 Type 2 diabetes mellitus with diabetic peripheral angiopathy without gangrene: Secondary | ICD-10-CM | POA: Diagnosis not present

## 2016-06-08 DIAGNOSIS — E1140 Type 2 diabetes mellitus with diabetic neuropathy, unspecified: Secondary | ICD-10-CM | POA: Diagnosis not present

## 2016-06-09 DIAGNOSIS — Z1231 Encounter for screening mammogram for malignant neoplasm of breast: Secondary | ICD-10-CM | POA: Diagnosis not present

## 2016-06-16 ENCOUNTER — Ambulatory Visit (INDEPENDENT_AMBULATORY_CARE_PROVIDER_SITE_OTHER): Payer: Medicare Other | Admitting: *Deleted

## 2016-06-16 DIAGNOSIS — Z5181 Encounter for therapeutic drug level monitoring: Secondary | ICD-10-CM

## 2016-06-16 LAB — POCT INR: INR: 1.7

## 2016-06-23 ENCOUNTER — Ambulatory Visit (INDEPENDENT_AMBULATORY_CARE_PROVIDER_SITE_OTHER): Payer: Medicare Other | Admitting: *Deleted

## 2016-06-23 ENCOUNTER — Ambulatory Visit (INDEPENDENT_AMBULATORY_CARE_PROVIDER_SITE_OTHER): Payer: Medicare Other | Admitting: Cardiology

## 2016-06-23 ENCOUNTER — Encounter: Payer: Self-pay | Admitting: Cardiology

## 2016-06-23 VITALS — BP 131/67 | HR 57 | Ht 67.0 in | Wt 172.0 lb

## 2016-06-23 DIAGNOSIS — E782 Mixed hyperlipidemia: Secondary | ICD-10-CM | POA: Diagnosis not present

## 2016-06-23 DIAGNOSIS — I482 Chronic atrial fibrillation, unspecified: Secondary | ICD-10-CM

## 2016-06-23 DIAGNOSIS — I350 Nonrheumatic aortic (valve) stenosis: Secondary | ICD-10-CM | POA: Diagnosis not present

## 2016-06-23 DIAGNOSIS — I25810 Atherosclerosis of coronary artery bypass graft(s) without angina pectoris: Secondary | ICD-10-CM | POA: Diagnosis not present

## 2016-06-23 DIAGNOSIS — Z5181 Encounter for therapeutic drug level monitoring: Secondary | ICD-10-CM | POA: Diagnosis not present

## 2016-06-23 DIAGNOSIS — I5032 Chronic diastolic (congestive) heart failure: Secondary | ICD-10-CM

## 2016-06-23 LAB — POCT INR: INR: 1.7

## 2016-06-23 NOTE — Patient Instructions (Signed)

## 2016-06-23 NOTE — Progress Notes (Signed)
Cardiology Office Note  Date: 06/23/2016   ID: Sharon Little, DOB [birth year], MRN [id number]  PCP: Gar Ponto, MD  Primary Cardiologist: Rozann Lesches, MD   Chief Complaint  Patient presents with  . Atrial Fibrillation    History of Present Illness: Sharon Little is an 82 y.o. female last seen in September. She presents with her sister for a routine follow-up visit. Mainly complains of chronic back pain, does not report any palpitations or exertional chest pain. I did review her records, she was hospitalized in October with acute on chronic diastolic heart failure associated with elevated troponin I level, however cardiac catheterization revealed moderate disease that was felt to be best managed medically.  She continues on Coumadin with follow-up in the anticoagulations clinic. No spontaneous bleeding problems.  Follow-up echocardiogram from October is outlined below. I discussed the results with her today. She has mild to moderate aortic stenosis.  Past Medical History:  Diagnosis Date  . Chronic atrial fibrillation ([place])   . Chronic back pain   . Hyperlipidemia   . Leg edema    Past Surgical History:  Procedure Laterality Date  . CARDIAC CATHETERIZATION N/A 04/06/2016   Procedure: Left Heart Cath and Coronary Angiography;  Surgeon: Jettie Booze, MD;  Location: [facility];  Service: Cardiovascular;  Laterality: N/A;    Current Outpatient Prescriptions  Medication Sig Dispense Refill  . acetaminophen (TYLENOL) 650 MG CR tablet Take 650 mg by mouth every 8 (eight) hours as needed for pain.     Marland Kitchen aspirin EC 81 MG EC tablet Take 1 tablet (81 mg total) by mouth daily.    . B Complex Vitamins (B COMPLEX-B12) TABS Take 1 tablet by mouth daily.     . Cholecalciferol (VITAMIN D3) 1000 UNITS tablet Take 1,000 Units by mouth daily.      Marland Kitchen diltiazem (CARDIZEM CD) 240 MG 24 hr capsule Take 240 mg by mouth daily.     . DimenhyDRINATE (TRAVEL SICKNESS PO) Take 1  tablet by mouth at bedtime as needed (headache).     . furosemide (LASIX) 40 MG tablet Take 1.5 tablets (60 mg total) by mouth daily. 30 tablet 6  . gabapentin (NEURONTIN) 600 MG tablet Take 600 mg by mouth 3 (three) times daily as needed (back pain).     Marland Kitchen lovastatin (MEVACOR) 20 MG tablet Take 20 mg by mouth daily with supper.     . metFORMIN (GLUCOPHAGE-XR) 500 MG 24 hr tablet Take 500 mg by mouth 2 (two) times daily.  1  . metoprolol succinate (TOPROL-XL) 25 MG 24 hr tablet Take 1 tablet (25 mg total) by mouth daily. 30 tablet 6  . Multiple Vitamin (MULTIVITAMIN WITH MINERALS) TABS tablet Take 1 tablet by mouth daily.    . nitroGLYCERIN (NITROSTAT) 0.4 MG SL tablet Place 1 tablet (0.4 mg total) under the tongue every 5 (five) minutes x 3 doses as needed for chest pain. 25 tablet 3  . OVER THE COUNTER MEDICATION Place 1 drop into both eyes daily. Over the counter lubricating eye drop    . ranitidine (ZANTAC) 150 MG tablet Take 150 mg by mouth 2 (two) times daily as needed for heartburn.   1  . traMADol (ULTRAM) 50 MG tablet Take 50 mg by mouth every 6 (six) hours as needed (pain).    Marland Kitchen warfarin (COUMADIN) 4 MG tablet Take 4 mg by mouth See admin instructions. Take 1 1/2 tablet (6 mg) by mouth on Tuesdays at  5pm, take 1 tablet (4 mg) on Sunday, Monday, Wednesday, Thursday, Friday, Saturday at 5pm.     No current facility-administered medications for this visit.    Allergies:  Penicillins   Social History: The patient  reports that she quit smoking about 48 years ago. Her smoking use included Cigarettes. She has a 48.00 pack-year smoking history. She has never used smokeless tobacco. She reports that she does not drink alcohol or use drugs.   ROS:  Please see the history of present illness. Otherwise, complete review of systems is positive for chronic back pain, diminished hearing.  All other systems are reviewed and negative.   Physical Exam: VS:  BP 131/67   Pulse (!) 57   Ht 5\' 7"  (1.702  m)   Wt 172 lb (78 kg)   SpO2 94%   BMI 26.94 kg/m , BMI Body mass index is 26.94 kg/m.  Wt Readings from Last 3 Encounters:  06/23/16 172 lb (78 kg)  04/15/16 170 lb (77.1 kg)  04/07/16 172 lb 3.2 oz (78.1 kg)    General: Elderly woman, appears comfortable at rest. HEENT: Conjunctiva and lids normal, oropharynx clear. Neck: Supple, no elevated JVP or carotid bruits, no thyromegaly. Lungs: Clear to auscultation, nonlabored breathing at rest. Cardiac: Irregularly irregular no S3, [id number] systolic murmur, no pericardial rub. Abdomen: Soft, nontender,bowel sounds present, no guarding or rebound. Extremities: Trace ankle edema, distal pulses 2+. Skin: Warm and dry. Musculoskeletal: No kyphosis. Neuropsychiatric: Alert and oriented x3, affect grossly appropriate.  ECG: I personally reviewed the tracing from 04/05/2016 which showed atrial fibrillation with left bundle branch block and occasional PVCs.  Recent Labwork: 04/07/2016: Hemoglobin 11.9; Platelets 285 04/15/2016: BUN 22; Creatinine, Ser 0.85; Potassium 4.5; Sodium 136     Component Value Date/Time   CHOL 146 04/04/2016 0310   TRIG 169 (H) 04/04/2016 0310   HDL 38 (L) 04/04/2016 0310   CHOLHDL 3.8 04/04/2016 0310   VLDL 34 04/04/2016 0310   LDLCALC 74 04/04/2016 0310    Other Studies Reviewed Today:  Echocardiogram 04/22/2016: Study Conclusions  - Left ventricle: The cavity size was normal. Wall thickness was   increased in a pattern of mild LVH. Systolic function was normal.   The estimated ejection fraction was in the range of 55% to 60%.   Wall motion was normal; there were no regional wall motion   abnormalities. - Aortic valve: Moderately calcified annulus. Trileaflet;   moderately thickened leaflets. There was mild to moderate   stenosis. Mean gradient (S): 14 mm Hg. Valve area (VTI): 1.09   cm^2. Valve area (Vmax): 1.23 cm^2. Valve area (Vmean): 1.17   cm^2. - Mitral valve: Mildly calcified annulus. Mildly  thickened leaflets   . There was mild regurgitation. Valve area by continuity equation   (using LVOT flow): 2.15 cm^2. - Left atrium: The atrium was severely dilated. - Right ventricle: The cavity size was mildly dilated. - Right atrium: The atrium was severely dilated. - Technically adequate study.  Cardiac catheterization 04/06/2016:  Dist Cx lesion, 50-60% stenosed, eccentric and calcified.  Mid LAD lesion, 50-60% stenosed, severely calcified and eccentric.  The left ventricular ejection fraction is 50-55% by visual estimate.  The left ventricular systolic function is normal.  There is no aortic valve stenosis.  LV end diastolic pressure is normal.  Assessment and Plan:  1. Chronic atrial fibrillation, continue strategy of heart rate control and anticoagulation. She remains on Cardizem CD and Coumadin.  2. Moderate 2 vessel CAD, being managed medically.  I reviewed her cardiac catheterization report from October. No active angina symptoms. She remains on statin therapy and low-dose aspirin.  3. Hyperlipidemia, on Mevacor. LDL 74 in October.  4. Chronic diastolic heart failure, LVEF 55-60%. Looks to be euvolemic today. She continues on Lasix.  5. Mild to moderate calcific aortic stenosis.  Current medicines were reviewed with the patient today.  No orders of the defined types were placed in this encounter.   Disposition:  Signed, Satira Sark, MD, [facility] 06/23/2016 3:12 PM    [facility], [place], [place] [address] Phone: [phone]; Fax: [phone]

## 2016-07-07 ENCOUNTER — Ambulatory Visit (INDEPENDENT_AMBULATORY_CARE_PROVIDER_SITE_OTHER): Payer: Medicare Other | Admitting: *Deleted

## 2016-07-07 DIAGNOSIS — Z5181 Encounter for therapeutic drug level monitoring: Secondary | ICD-10-CM | POA: Diagnosis not present

## 2016-07-07 LAB — POCT INR: INR: 2.4

## 2016-07-22 DIAGNOSIS — N182 Chronic kidney disease, stage 2 (mild): Secondary | ICD-10-CM | POA: Diagnosis not present

## 2016-07-22 DIAGNOSIS — I340 Nonrheumatic mitral (valve) insufficiency: Secondary | ICD-10-CM | POA: Diagnosis not present

## 2016-07-22 DIAGNOSIS — K219 Gastro-esophageal reflux disease without esophagitis: Secondary | ICD-10-CM | POA: Diagnosis not present

## 2016-07-22 DIAGNOSIS — E782 Mixed hyperlipidemia: Secondary | ICD-10-CM | POA: Diagnosis not present

## 2016-07-22 DIAGNOSIS — Z9189 Other specified personal risk factors, not elsewhere classified: Secondary | ICD-10-CM | POA: Diagnosis not present

## 2016-07-22 DIAGNOSIS — I10 Essential (primary) hypertension: Secondary | ICD-10-CM | POA: Diagnosis not present

## 2016-07-22 DIAGNOSIS — E1122 Type 2 diabetes mellitus with diabetic chronic kidney disease: Secondary | ICD-10-CM | POA: Diagnosis not present

## 2016-07-23 ENCOUNTER — Telehealth: Payer: Self-pay

## 2016-07-23 NOTE — Telephone Encounter (Signed)
[employer] faxed aletter that had concern over patient taking both Diltiazem CD 240 mg and Toprol XL 25 mg daily. Patient without complaints of tachycardia,states she doesn't feel her A-fib.  Per Dr Domenic Polite note" Toprol XL was added after October 2017 hospitalization.Would check with her to see how she is doing, but we can probably drop the Toprol XL and continue Diltiazem CD and still have good HR control with AF"     Pt understands to call office if she has any change after stopping toprol XL

## 2016-07-28 DIAGNOSIS — I10 Essential (primary) hypertension: Secondary | ICD-10-CM | POA: Diagnosis not present

## 2016-07-28 DIAGNOSIS — Z23 Encounter for immunization: Secondary | ICD-10-CM | POA: Diagnosis not present

## 2016-07-28 DIAGNOSIS — I5032 Chronic diastolic (congestive) heart failure: Secondary | ICD-10-CM | POA: Diagnosis not present

## 2016-07-28 DIAGNOSIS — I482 Chronic atrial fibrillation: Secondary | ICD-10-CM | POA: Diagnosis not present

## 2016-07-28 DIAGNOSIS — E782 Mixed hyperlipidemia: Secondary | ICD-10-CM | POA: Diagnosis not present

## 2016-07-28 DIAGNOSIS — M545 Low back pain: Secondary | ICD-10-CM | POA: Diagnosis not present

## 2016-07-28 DIAGNOSIS — E1122 Type 2 diabetes mellitus with diabetic chronic kidney disease: Secondary | ICD-10-CM | POA: Diagnosis not present

## 2016-07-28 DIAGNOSIS — Z6828 Body mass index (BMI) 28.0-28.9, adult: Secondary | ICD-10-CM | POA: Diagnosis not present

## 2016-08-06 ENCOUNTER — Ambulatory Visit (INDEPENDENT_AMBULATORY_CARE_PROVIDER_SITE_OTHER): Payer: Medicare Other | Admitting: *Deleted

## 2016-08-06 DIAGNOSIS — Z5181 Encounter for therapeutic drug level monitoring: Secondary | ICD-10-CM | POA: Diagnosis not present

## 2016-08-06 LAB — POCT INR: INR: 2.1

## 2016-08-18 DIAGNOSIS — E1140 Type 2 diabetes mellitus with diabetic neuropathy, unspecified: Secondary | ICD-10-CM | POA: Diagnosis not present

## 2016-08-18 DIAGNOSIS — E1151 Type 2 diabetes mellitus with diabetic peripheral angiopathy without gangrene: Secondary | ICD-10-CM | POA: Diagnosis not present

## 2016-08-25 DIAGNOSIS — G588 Other specified mononeuropathies: Secondary | ICD-10-CM | POA: Diagnosis present

## 2016-08-25 DIAGNOSIS — I5033 Acute on chronic diastolic (congestive) heart failure: Secondary | ICD-10-CM | POA: Diagnosis present

## 2016-08-25 DIAGNOSIS — I4891 Unspecified atrial fibrillation: Secondary | ICD-10-CM | POA: Diagnosis not present

## 2016-08-25 DIAGNOSIS — E785 Hyperlipidemia, unspecified: Secondary | ICD-10-CM | POA: Diagnosis present

## 2016-08-25 DIAGNOSIS — I482 Chronic atrial fibrillation: Secondary | ICD-10-CM | POA: Diagnosis present

## 2016-08-25 DIAGNOSIS — R140 Abdominal distension (gaseous): Secondary | ICD-10-CM | POA: Diagnosis not present

## 2016-08-25 DIAGNOSIS — I110 Hypertensive heart disease with heart failure: Secondary | ICD-10-CM | POA: Diagnosis not present

## 2016-08-25 DIAGNOSIS — F419 Anxiety disorder, unspecified: Secondary | ICD-10-CM | POA: Diagnosis not present

## 2016-08-25 DIAGNOSIS — K7689 Other specified diseases of liver: Secondary | ICD-10-CM | POA: Diagnosis not present

## 2016-08-25 DIAGNOSIS — Z880 Allergy status to penicillin: Secondary | ICD-10-CM | POA: Diagnosis not present

## 2016-08-25 DIAGNOSIS — Z9109 Other allergy status, other than to drugs and biological substances: Secondary | ICD-10-CM | POA: Diagnosis not present

## 2016-08-25 DIAGNOSIS — R51 Headache: Secondary | ICD-10-CM | POA: Diagnosis not present

## 2016-08-25 DIAGNOSIS — J189 Pneumonia, unspecified organism: Secondary | ICD-10-CM | POA: Diagnosis not present

## 2016-08-25 DIAGNOSIS — R1110 Vomiting, unspecified: Secondary | ICD-10-CM | POA: Diagnosis not present

## 2016-08-25 DIAGNOSIS — I350 Nonrheumatic aortic (valve) stenosis: Secondary | ICD-10-CM | POA: Diagnosis present

## 2016-08-25 DIAGNOSIS — I2510 Atherosclerotic heart disease of native coronary artery without angina pectoris: Secondary | ICD-10-CM | POA: Diagnosis not present

## 2016-08-25 DIAGNOSIS — M47816 Spondylosis without myelopathy or radiculopathy, lumbar region: Secondary | ICD-10-CM | POA: Diagnosis not present

## 2016-08-25 DIAGNOSIS — R0602 Shortness of breath: Secondary | ICD-10-CM | POA: Diagnosis not present

## 2016-08-25 DIAGNOSIS — R0600 Dyspnea, unspecified: Secondary | ICD-10-CM | POA: Diagnosis not present

## 2016-08-25 DIAGNOSIS — S29011A Strain of muscle and tendon of front wall of thorax, initial encounter: Secondary | ICD-10-CM | POA: Diagnosis not present

## 2016-08-25 DIAGNOSIS — R0902 Hypoxemia: Secondary | ICD-10-CM | POA: Diagnosis not present

## 2016-08-25 DIAGNOSIS — M810 Age-related osteoporosis without current pathological fracture: Secondary | ICD-10-CM | POA: Diagnosis present

## 2016-08-25 DIAGNOSIS — R0682 Tachypnea, not elsewhere classified: Secondary | ICD-10-CM | POA: Diagnosis not present

## 2016-08-25 DIAGNOSIS — K219 Gastro-esophageal reflux disease without esophagitis: Secondary | ICD-10-CM | POA: Diagnosis present

## 2016-08-25 DIAGNOSIS — Z7901 Long term (current) use of anticoagulants: Secondary | ICD-10-CM | POA: Diagnosis not present

## 2016-08-25 DIAGNOSIS — N281 Cyst of kidney, acquired: Secondary | ICD-10-CM | POA: Diagnosis present

## 2016-08-25 DIAGNOSIS — I700 Atherosclerosis of aorta: Secondary | ICD-10-CM | POA: Diagnosis present

## 2016-08-25 DIAGNOSIS — K429 Umbilical hernia without obstruction or gangrene: Secondary | ICD-10-CM | POA: Diagnosis present

## 2016-08-25 DIAGNOSIS — B0229 Other postherpetic nervous system involvement: Secondary | ICD-10-CM | POA: Diagnosis not present

## 2016-09-01 DIAGNOSIS — M5186 Other intervertebral disc disorders, lumbar region: Secondary | ICD-10-CM | POA: Diagnosis not present

## 2016-09-01 DIAGNOSIS — I509 Heart failure, unspecified: Secondary | ICD-10-CM | POA: Diagnosis not present

## 2016-09-01 DIAGNOSIS — E119 Type 2 diabetes mellitus without complications: Secondary | ICD-10-CM | POA: Diagnosis not present

## 2016-09-01 DIAGNOSIS — M519 Unspecified thoracic, thoracolumbar and lumbosacral intervertebral disc disorder: Secondary | ICD-10-CM | POA: Diagnosis not present

## 2016-09-01 DIAGNOSIS — I4891 Unspecified atrial fibrillation: Secondary | ICD-10-CM | POA: Diagnosis not present

## 2016-09-01 DIAGNOSIS — Z7982 Long term (current) use of aspirin: Secondary | ICD-10-CM | POA: Diagnosis not present

## 2016-09-01 DIAGNOSIS — M47814 Spondylosis without myelopathy or radiculopathy, thoracic region: Secondary | ICD-10-CM | POA: Diagnosis not present

## 2016-09-01 DIAGNOSIS — M5184 Other intervertebral disc disorders, thoracic region: Secondary | ICD-10-CM | POA: Diagnosis not present

## 2016-09-01 DIAGNOSIS — Z7901 Long term (current) use of anticoagulants: Secondary | ICD-10-CM | POA: Diagnosis not present

## 2016-09-01 DIAGNOSIS — Z79899 Other long term (current) drug therapy: Secondary | ICD-10-CM | POA: Diagnosis not present

## 2016-09-01 DIAGNOSIS — M47816 Spondylosis without myelopathy or radiculopathy, lumbar region: Secondary | ICD-10-CM | POA: Diagnosis not present

## 2016-09-03 ENCOUNTER — Ambulatory Visit (INDEPENDENT_AMBULATORY_CARE_PROVIDER_SITE_OTHER): Payer: Medicare Other | Admitting: *Deleted

## 2016-09-03 DIAGNOSIS — Z5181 Encounter for therapeutic drug level monitoring: Secondary | ICD-10-CM | POA: Diagnosis not present

## 2016-09-03 LAB — POCT INR: INR: 2.7

## 2016-09-26 DIAGNOSIS — I482 Chronic atrial fibrillation: Secondary | ICD-10-CM | POA: Diagnosis not present

## 2016-09-26 DIAGNOSIS — I5032 Chronic diastolic (congestive) heart failure: Secondary | ICD-10-CM | POA: Diagnosis not present

## 2016-09-26 DIAGNOSIS — Z6828 Body mass index (BMI) 28.0-28.9, adult: Secondary | ICD-10-CM | POA: Diagnosis not present

## 2016-09-28 DIAGNOSIS — Z23 Encounter for immunization: Secondary | ICD-10-CM | POA: Diagnosis not present

## 2016-10-01 ENCOUNTER — Ambulatory Visit (INDEPENDENT_AMBULATORY_CARE_PROVIDER_SITE_OTHER): Payer: Medicare Other | Admitting: *Deleted

## 2016-10-01 DIAGNOSIS — Z5181 Encounter for therapeutic drug level monitoring: Secondary | ICD-10-CM

## 2016-10-01 LAB — POCT INR: INR: 4

## 2016-10-01 MED ORDER — WARFARIN SODIUM 4 MG PO TABS: ORAL_TABLET | ORAL | 3 refills | Status: DC

## 2016-10-09 DIAGNOSIS — L570 Actinic keratosis: Secondary | ICD-10-CM | POA: Diagnosis not present

## 2016-10-12 ENCOUNTER — Other Ambulatory Visit: Payer: Self-pay | Admitting: Cardiology

## 2016-10-15 ENCOUNTER — Ambulatory Visit (INDEPENDENT_AMBULATORY_CARE_PROVIDER_SITE_OTHER): Payer: Medicare Other | Admitting: *Deleted

## 2016-10-15 DIAGNOSIS — Z5181 Encounter for therapeutic drug level monitoring: Secondary | ICD-10-CM

## 2016-10-15 LAB — POCT INR: INR: 3.8

## 2016-10-26 DIAGNOSIS — N182 Chronic kidney disease, stage 2 (mild): Secondary | ICD-10-CM | POA: Diagnosis not present

## 2016-10-26 DIAGNOSIS — E119 Type 2 diabetes mellitus without complications: Secondary | ICD-10-CM | POA: Diagnosis not present

## 2016-10-26 DIAGNOSIS — E782 Mixed hyperlipidemia: Secondary | ICD-10-CM | POA: Diagnosis not present

## 2016-10-26 DIAGNOSIS — K219 Gastro-esophageal reflux disease without esophagitis: Secondary | ICD-10-CM | POA: Diagnosis not present

## 2016-10-26 DIAGNOSIS — I10 Essential (primary) hypertension: Secondary | ICD-10-CM | POA: Diagnosis not present

## 2016-10-28 DIAGNOSIS — I10 Essential (primary) hypertension: Secondary | ICD-10-CM | POA: Diagnosis not present

## 2016-10-28 DIAGNOSIS — I482 Chronic atrial fibrillation: Secondary | ICD-10-CM | POA: Diagnosis not present

## 2016-10-28 DIAGNOSIS — E1122 Type 2 diabetes mellitus with diabetic chronic kidney disease: Secondary | ICD-10-CM | POA: Diagnosis not present

## 2016-10-28 DIAGNOSIS — Z6827 Body mass index (BMI) 27.0-27.9, adult: Secondary | ICD-10-CM | POA: Diagnosis not present

## 2016-10-28 DIAGNOSIS — K219 Gastro-esophageal reflux disease without esophagitis: Secondary | ICD-10-CM | POA: Diagnosis not present

## 2016-10-28 DIAGNOSIS — I5032 Chronic diastolic (congestive) heart failure: Secondary | ICD-10-CM | POA: Diagnosis not present

## 2016-10-28 DIAGNOSIS — E782 Mixed hyperlipidemia: Secondary | ICD-10-CM | POA: Diagnosis not present

## 2016-10-28 DIAGNOSIS — M545 Low back pain: Secondary | ICD-10-CM | POA: Diagnosis not present

## 2016-10-29 ENCOUNTER — Ambulatory Visit (INDEPENDENT_AMBULATORY_CARE_PROVIDER_SITE_OTHER): Payer: Medicare Other | Admitting: *Deleted

## 2016-10-29 DIAGNOSIS — Z5181 Encounter for therapeutic drug level monitoring: Secondary | ICD-10-CM | POA: Diagnosis not present

## 2016-10-29 LAB — POCT INR: INR: 3

## 2016-11-02 DIAGNOSIS — E1140 Type 2 diabetes mellitus with diabetic neuropathy, unspecified: Secondary | ICD-10-CM | POA: Diagnosis not present

## 2016-11-02 DIAGNOSIS — E1151 Type 2 diabetes mellitus with diabetic peripheral angiopathy without gangrene: Secondary | ICD-10-CM | POA: Diagnosis not present

## 2016-11-26 ENCOUNTER — Ambulatory Visit (INDEPENDENT_AMBULATORY_CARE_PROVIDER_SITE_OTHER): Payer: Medicare Other | Admitting: *Deleted

## 2016-11-26 DIAGNOSIS — Z5181 Encounter for therapeutic drug level monitoring: Secondary | ICD-10-CM

## 2016-11-26 LAB — POCT INR: INR: 2.6

## 2016-12-04 ENCOUNTER — Other Ambulatory Visit: Payer: Self-pay | Admitting: Cardiology

## 2016-12-24 ENCOUNTER — Ambulatory Visit (INDEPENDENT_AMBULATORY_CARE_PROVIDER_SITE_OTHER): Payer: Medicare Other | Admitting: *Deleted

## 2016-12-24 DIAGNOSIS — Z5181 Encounter for therapeutic drug level monitoring: Secondary | ICD-10-CM

## 2016-12-24 LAB — POCT INR: INR: 2.7

## 2016-12-28 NOTE — Progress Notes (Signed)
Cardiology Office Note  Date: 12/29/2016   ID: Sharon Little, DOB Jul 16, 1928, MRN [id number]  PCP: Sharon Bis, MD  Primary Cardiologist: Sharon Lesches, MD   Chief Complaint  Patient presents with  . Atrial Fibrillation    History of Present Illness: Sharon Little is an 83 y.o. female last seen in December 2017. She is here today with her sister for a routine follow-up visit. Reports no palpitations or chest pain. Since last encounter we took her off Toprol-XL, she has had adequate heart rate control on Cardizem CD alone.  She continues to follow in the anticoagulation clinic on Coumadin. She does not report any bleeding problems. Recent INR was 2.7.  Past Medical History:  Diagnosis Date  . Chronic atrial fibrillation ([place])   . Chronic back pain   . Hyperlipidemia   . Leg edema     Past Surgical History:  Procedure Laterality Date  . CARDIAC CATHETERIZATION N/A 04/06/2016   Procedure: Left Heart Cath and Coronary Angiography;  Surgeon: Jettie Booze, MD;  Location: [facility];  Service: Cardiovascular;  Laterality: N/A;    Current Outpatient Prescriptions  Medication Sig Dispense Refill  . acetaminophen (TYLENOL) 650 MG CR tablet Take 650 mg by mouth every 8 (eight) hours as needed for pain.     Marland Kitchen aspirin EC 81 MG EC tablet Take 1 tablet (81 mg total) by mouth daily.    . B Complex Vitamins (B COMPLEX-B12) TABS Take 1 tablet by mouth daily.     . Cholecalciferol (VITAMIN D3) 1000 UNITS tablet Take 1,000 Units by mouth daily.      Marland Kitchen diltiazem (CARDIZEM CD) 240 MG 24 hr capsule Take 240 mg by mouth daily.     . DimenhyDRINATE (TRAVEL SICKNESS PO) Take 1 tablet by mouth at bedtime as needed (headache).     . furosemide (LASIX) 40 MG tablet TAKE 1 AND 1/2 TABLETS BY MOUTH DAILY 45 tablet 1  . gabapentin (NEURONTIN) 600 MG tablet Take 600 mg by mouth 3 (three) times daily as needed (back pain).     Marland Kitchen lovastatin (MEVACOR) 20 MG tablet Take 20 mg by mouth  daily with supper.     . Multiple Vitamin (MULTIVITAMIN WITH MINERALS) TABS tablet Take 1 tablet by mouth daily.    . nitroGLYCERIN (NITROSTAT) 0.4 MG SL tablet Place 1 tablet (0.4 mg total) under the tongue every 5 (five) minutes x 3 doses as needed for chest pain. 25 tablet 3  . OVER THE COUNTER MEDICATION Place 1 drop into both eyes daily. Over the counter lubricating eye drop    . ranitidine (ZANTAC) 150 MG tablet Take 150 mg by mouth 2 (two) times daily as needed for heartburn.   1  . traMADol (ULTRAM) 50 MG tablet Take 50 mg by mouth every 6 (six) hours as needed (pain).    Marland Kitchen warfarin (COUMADIN) 4 MG tablet Take 1 1/2 tablets (6 mg) by mouth daily except 1 tablet (4 mg) on Sunday, Tuesdays and Thursdays at 5pm. 45 tablet 3   No current facility-administered medications for this visit.    Allergies:  Penicillins   Social History: The patient  reports that she quit smoking about 48 years ago. Her smoking use included Cigarettes. She has a 48.00 pack-year smoking history. She has never used smokeless tobacco. She reports that she does not drink alcohol or use drugs.   ROS:  Please see the history of present illness. Otherwise, complete review of  systems is positive for chronic back pain.  All other systems are reviewed and negative.   Physical Exam: VS:  BP 138/78   Pulse 84   Ht 5\' 5"  (1.651 m)   Wt 169 lb (76.7 kg)   SpO2 95%   BMI 28.12 kg/m , BMI Body mass index is 28.12 kg/m.  Wt Readings from Last 3 Encounters:  12/29/16 169 lb (76.7 kg)  06/23/16 172 lb (78 kg)  04/15/16 170 lb (77.1 kg)    General: Elderly woman,appears comfortable at rest. HEENT: Conjunctiva and lids normal, oropharynx clear. Neck: Supple, no elevated JVP or carotid bruits, no thyromegaly. Lungs: Clear to auscultation, nonlabored breathing at rest. Cardiac: Irregularly irregularno S3, 4-4/9 systolic murmur, no pericardial rub. Abdomen: Soft, nontender,bowel sounds present, no guarding or  rebound. Extremities: Trace ankle edema, distal pulses 2+.  ECG: I personally reviewed the tracing from 04/05/2016 which showed atrial fibrillation with left bundle branch block and occasional PVCs.  Recent Labwork: 04/07/2016: Hemoglobin 11.9; Platelets 285 04/15/2016: BUN 22; Creatinine, Ser 0.85; Potassium 4.5; Sodium 136     Component Value Date/Time   CHOL 146 04/04/2016 0310   TRIG 169 (H) 04/04/2016 0310   HDL 38 (L) 04/04/2016 0310   CHOLHDL 3.8 04/04/2016 0310   VLDL 34 04/04/2016 0310   LDLCALC 74 04/04/2016 0310    Other Studies Reviewed Today:  Echocardiogram 04/22/2016: Study Conclusions  - Left ventricle: The cavity size was normal. Wall thickness was increased in a pattern of mild LVH. Systolic function was normal. The estimated ejection fraction was in the range of 55% to 60%. Wall motion was normal; there were no regional wall motion abnormalities. - Aortic valve: Moderately calcified annulus. Trileaflet; moderately thickened leaflets. There was mild to moderate stenosis. Mean gradient (S): 14 mm Hg. Valve area (VTI): 1.09 cm^2. Valve area (Vmax): 1.23 cm^2. Valve area (Vmean): 1.17 cm^2. - Mitral valve: Mildly calcified annulus. Mildly thickened leaflets . There was mild regurgitation. Valve area by continuity equation (using LVOT flow): 2.15 cm^2. - Left atrium: The atrium was severely dilated. - Right ventricle: The cavity size was mildly dilated. - Right atrium: The atrium was severely dilated. - Technically adequate study.  Cardiac catheterization 04/06/2016:  Dist Cx lesion, 50-60% stenosed, eccentric and calcified.  Mid LAD lesion, 50-60% stenosed, severely calcified and eccentric.  The left ventricular ejection fraction is 50-55% by visual estimate.  The left ventricular systolic function is normal.  There is no aortic valve stenosis.  LV end diastolic pressure is normal.  Assessment and Plan:  1. Chronic atrial  fibrillation. Continue Cardizem CD and Coumadin. Keep follow-up in the anticoagulation clinic.  2. Mild to moderate aortic stenosis, asymptomatic.  Current medicines were reviewed with the patient today.  Disposition: Follow-up in 6 months.  Signed, Satira Sark, MD, [facility] 12/29/2016 3:05 PM    [facility] [place], [place], [place] [address] Phone: [phone]; Fax: [phone]

## 2016-12-29 ENCOUNTER — Encounter: Payer: Self-pay | Admitting: Cardiology

## 2016-12-29 ENCOUNTER — Ambulatory Visit (INDEPENDENT_AMBULATORY_CARE_PROVIDER_SITE_OTHER): Payer: Medicare Other | Admitting: Cardiology

## 2016-12-29 VITALS — BP 138/78 | HR 84 | Ht 65.0 in | Wt 169.0 lb

## 2016-12-29 DIAGNOSIS — I482 Chronic atrial fibrillation, unspecified: Secondary | ICD-10-CM

## 2016-12-29 DIAGNOSIS — I350 Nonrheumatic aortic (valve) stenosis: Secondary | ICD-10-CM

## 2016-12-29 NOTE — Patient Instructions (Signed)

## 2016-12-31 ENCOUNTER — Other Ambulatory Visit: Payer: Self-pay | Admitting: Cardiology

## 2017-01-25 DIAGNOSIS — E1151 Type 2 diabetes mellitus with diabetic peripheral angiopathy without gangrene: Secondary | ICD-10-CM | POA: Diagnosis not present

## 2017-01-25 DIAGNOSIS — E1140 Type 2 diabetes mellitus with diabetic neuropathy, unspecified: Secondary | ICD-10-CM | POA: Diagnosis not present

## 2017-02-02 ENCOUNTER — Ambulatory Visit (INDEPENDENT_AMBULATORY_CARE_PROVIDER_SITE_OTHER): Payer: Medicare Other | Admitting: *Deleted

## 2017-02-02 DIAGNOSIS — Z5181 Encounter for therapeutic drug level monitoring: Secondary | ICD-10-CM

## 2017-02-02 LAB — POCT INR: INR: 2.5

## 2017-02-18 DIAGNOSIS — I10 Essential (primary) hypertension: Secondary | ICD-10-CM | POA: Diagnosis not present

## 2017-02-18 DIAGNOSIS — I340 Nonrheumatic mitral (valve) insufficiency: Secondary | ICD-10-CM | POA: Diagnosis not present

## 2017-02-18 DIAGNOSIS — I5032 Chronic diastolic (congestive) heart failure: Secondary | ICD-10-CM | POA: Diagnosis not present

## 2017-02-18 DIAGNOSIS — Z6828 Body mass index (BMI) 28.0-28.9, adult: Secondary | ICD-10-CM | POA: Diagnosis not present

## 2017-02-18 DIAGNOSIS — I482 Chronic atrial fibrillation: Secondary | ICD-10-CM | POA: Diagnosis not present

## 2017-02-18 DIAGNOSIS — E1122 Type 2 diabetes mellitus with diabetic chronic kidney disease: Secondary | ICD-10-CM | POA: Diagnosis not present

## 2017-02-18 DIAGNOSIS — E782 Mixed hyperlipidemia: Secondary | ICD-10-CM | POA: Diagnosis not present

## 2017-02-18 DIAGNOSIS — K219 Gastro-esophageal reflux disease without esophagitis: Secondary | ICD-10-CM | POA: Diagnosis not present

## 2017-02-18 DIAGNOSIS — Z9189 Other specified personal risk factors, not elsewhere classified: Secondary | ICD-10-CM | POA: Diagnosis not present

## 2017-02-18 DIAGNOSIS — Z0001 Encounter for general adult medical examination with abnormal findings: Secondary | ICD-10-CM | POA: Diagnosis not present

## 2017-02-23 DIAGNOSIS — K7581 Nonalcoholic steatohepatitis (NASH): Secondary | ICD-10-CM | POA: Diagnosis not present

## 2017-02-23 DIAGNOSIS — I10 Essential (primary) hypertension: Secondary | ICD-10-CM | POA: Diagnosis not present

## 2017-02-23 DIAGNOSIS — N182 Chronic kidney disease, stage 2 (mild): Secondary | ICD-10-CM | POA: Diagnosis not present

## 2017-02-23 DIAGNOSIS — I5032 Chronic diastolic (congestive) heart failure: Secondary | ICD-10-CM | POA: Diagnosis not present

## 2017-02-23 DIAGNOSIS — Z6827 Body mass index (BMI) 27.0-27.9, adult: Secondary | ICD-10-CM | POA: Diagnosis not present

## 2017-02-23 DIAGNOSIS — M545 Low back pain: Secondary | ICD-10-CM | POA: Diagnosis not present

## 2017-02-23 DIAGNOSIS — I482 Chronic atrial fibrillation: Secondary | ICD-10-CM | POA: Diagnosis not present

## 2017-02-23 DIAGNOSIS — E1122 Type 2 diabetes mellitus with diabetic chronic kidney disease: Secondary | ICD-10-CM | POA: Diagnosis not present

## 2017-03-11 ENCOUNTER — Ambulatory Visit (INDEPENDENT_AMBULATORY_CARE_PROVIDER_SITE_OTHER): Payer: Medicare Other | Admitting: *Deleted

## 2017-03-11 DIAGNOSIS — Z5181 Encounter for therapeutic drug level monitoring: Secondary | ICD-10-CM | POA: Diagnosis not present

## 2017-03-11 LAB — POCT INR: INR: 2.4

## 2017-03-13 ENCOUNTER — Other Ambulatory Visit: Payer: Self-pay | Admitting: Cardiology

## 2017-03-22 DIAGNOSIS — Z23 Encounter for immunization: Secondary | ICD-10-CM | POA: Diagnosis not present

## 2017-03-31 DIAGNOSIS — Z6827 Body mass index (BMI) 27.0-27.9, adult: Secondary | ICD-10-CM | POA: Diagnosis not present

## 2017-03-31 DIAGNOSIS — M5432 Sciatica, left side: Secondary | ICD-10-CM | POA: Diagnosis not present

## 2017-04-02 DIAGNOSIS — M79605 Pain in left leg: Secondary | ICD-10-CM | POA: Diagnosis not present

## 2017-04-02 DIAGNOSIS — R531 Weakness: Secondary | ICD-10-CM | POA: Diagnosis not present

## 2017-04-02 DIAGNOSIS — E119 Type 2 diabetes mellitus without complications: Secondary | ICD-10-CM | POA: Diagnosis not present

## 2017-04-02 DIAGNOSIS — S3992XA Unspecified injury of lower back, initial encounter: Secondary | ICD-10-CM | POA: Diagnosis not present

## 2017-04-02 DIAGNOSIS — Z79899 Other long term (current) drug therapy: Secondary | ICD-10-CM | POA: Diagnosis not present

## 2017-04-02 DIAGNOSIS — S3993XA Unspecified injury of pelvis, initial encounter: Secondary | ICD-10-CM | POA: Diagnosis not present

## 2017-04-02 DIAGNOSIS — I509 Heart failure, unspecified: Secondary | ICD-10-CM | POA: Diagnosis not present

## 2017-04-02 DIAGNOSIS — M545 Low back pain: Secondary | ICD-10-CM | POA: Diagnosis not present

## 2017-04-02 DIAGNOSIS — R030 Elevated blood-pressure reading, without diagnosis of hypertension: Secondary | ICD-10-CM | POA: Diagnosis not present

## 2017-04-02 DIAGNOSIS — M5432 Sciatica, left side: Secondary | ICD-10-CM | POA: Diagnosis not present

## 2017-04-02 DIAGNOSIS — R9431 Abnormal electrocardiogram [ECG] [EKG]: Secondary | ICD-10-CM | POA: Diagnosis not present

## 2017-04-02 DIAGNOSIS — I4891 Unspecified atrial fibrillation: Secondary | ICD-10-CM | POA: Diagnosis not present

## 2017-04-02 DIAGNOSIS — Z7982 Long term (current) use of aspirin: Secondary | ICD-10-CM | POA: Diagnosis not present

## 2017-04-03 DIAGNOSIS — Z5181 Encounter for therapeutic drug level monitoring: Secondary | ICD-10-CM | POA: Diagnosis not present

## 2017-04-03 DIAGNOSIS — I4891 Unspecified atrial fibrillation: Secondary | ICD-10-CM | POA: Diagnosis not present

## 2017-04-03 DIAGNOSIS — M5432 Sciatica, left side: Secondary | ICD-10-CM | POA: Diagnosis not present

## 2017-04-03 DIAGNOSIS — R262 Difficulty in walking, not elsewhere classified: Secondary | ICD-10-CM | POA: Diagnosis not present

## 2017-04-03 DIAGNOSIS — Z7901 Long term (current) use of anticoagulants: Secondary | ICD-10-CM | POA: Diagnosis not present

## 2017-04-03 DIAGNOSIS — M6281 Muscle weakness (generalized): Secondary | ICD-10-CM | POA: Diagnosis not present

## 2017-04-03 DIAGNOSIS — M5440 Lumbago with sciatica, unspecified side: Secondary | ICD-10-CM | POA: Diagnosis not present

## 2017-04-05 DIAGNOSIS — R262 Difficulty in walking, not elsewhere classified: Secondary | ICD-10-CM | POA: Diagnosis not present

## 2017-04-05 DIAGNOSIS — Z5181 Encounter for therapeutic drug level monitoring: Secondary | ICD-10-CM | POA: Diagnosis not present

## 2017-04-05 DIAGNOSIS — M5432 Sciatica, left side: Secondary | ICD-10-CM | POA: Diagnosis not present

## 2017-04-05 DIAGNOSIS — M6281 Muscle weakness (generalized): Secondary | ICD-10-CM | POA: Diagnosis not present

## 2017-04-05 DIAGNOSIS — Z7901 Long term (current) use of anticoagulants: Secondary | ICD-10-CM | POA: Diagnosis not present

## 2017-04-06 DIAGNOSIS — I4891 Unspecified atrial fibrillation: Secondary | ICD-10-CM | POA: Diagnosis not present

## 2017-04-06 DIAGNOSIS — M6281 Muscle weakness (generalized): Secondary | ICD-10-CM | POA: Diagnosis not present

## 2017-04-06 DIAGNOSIS — M5440 Lumbago with sciatica, unspecified side: Secondary | ICD-10-CM | POA: Diagnosis not present

## 2017-04-06 DIAGNOSIS — M5432 Sciatica, left side: Secondary | ICD-10-CM | POA: Diagnosis not present

## 2017-04-06 DIAGNOSIS — R262 Difficulty in walking, not elsewhere classified: Secondary | ICD-10-CM | POA: Diagnosis not present

## 2017-04-07 DIAGNOSIS — R262 Difficulty in walking, not elsewhere classified: Secondary | ICD-10-CM | POA: Diagnosis not present

## 2017-04-07 DIAGNOSIS — M5432 Sciatica, left side: Secondary | ICD-10-CM | POA: Diagnosis not present

## 2017-04-07 DIAGNOSIS — M6281 Muscle weakness (generalized): Secondary | ICD-10-CM | POA: Diagnosis not present

## 2017-04-08 DIAGNOSIS — M6281 Muscle weakness (generalized): Secondary | ICD-10-CM | POA: Diagnosis not present

## 2017-04-08 DIAGNOSIS — M5432 Sciatica, left side: Secondary | ICD-10-CM | POA: Diagnosis not present

## 2017-04-08 DIAGNOSIS — R262 Difficulty in walking, not elsewhere classified: Secondary | ICD-10-CM | POA: Diagnosis not present

## 2017-04-09 DIAGNOSIS — Z7901 Long term (current) use of anticoagulants: Secondary | ICD-10-CM | POA: Diagnosis not present

## 2017-04-09 DIAGNOSIS — Z5181 Encounter for therapeutic drug level monitoring: Secondary | ICD-10-CM | POA: Diagnosis not present

## 2017-04-10 DIAGNOSIS — I350 Nonrheumatic aortic (valve) stenosis: Secondary | ICD-10-CM | POA: Diagnosis not present

## 2017-04-10 DIAGNOSIS — Z7901 Long term (current) use of anticoagulants: Secondary | ICD-10-CM | POA: Diagnosis not present

## 2017-04-10 DIAGNOSIS — M5116 Intervertebral disc disorders with radiculopathy, lumbar region: Secondary | ICD-10-CM | POA: Diagnosis not present

## 2017-04-10 DIAGNOSIS — M1991 Primary osteoarthritis, unspecified site: Secondary | ICD-10-CM | POA: Diagnosis not present

## 2017-04-10 DIAGNOSIS — Z9181 History of falling: Secondary | ICD-10-CM | POA: Diagnosis not present

## 2017-04-10 DIAGNOSIS — B0229 Other postherpetic nervous system involvement: Secondary | ICD-10-CM | POA: Diagnosis not present

## 2017-04-10 DIAGNOSIS — I5032 Chronic diastolic (congestive) heart failure: Secondary | ICD-10-CM | POA: Diagnosis not present

## 2017-04-10 DIAGNOSIS — Z7982 Long term (current) use of aspirin: Secondary | ICD-10-CM | POA: Diagnosis not present

## 2017-04-10 DIAGNOSIS — I481 Persistent atrial fibrillation: Secondary | ICD-10-CM | POA: Diagnosis not present

## 2017-04-10 DIAGNOSIS — M6281 Muscle weakness (generalized): Secondary | ICD-10-CM | POA: Diagnosis not present

## 2017-04-10 DIAGNOSIS — K5900 Constipation, unspecified: Secondary | ICD-10-CM | POA: Diagnosis not present

## 2017-04-13 DIAGNOSIS — I350 Nonrheumatic aortic (valve) stenosis: Secondary | ICD-10-CM | POA: Diagnosis not present

## 2017-04-13 DIAGNOSIS — I481 Persistent atrial fibrillation: Secondary | ICD-10-CM | POA: Diagnosis not present

## 2017-04-13 DIAGNOSIS — M1991 Primary osteoarthritis, unspecified site: Secondary | ICD-10-CM | POA: Diagnosis not present

## 2017-04-13 DIAGNOSIS — B0229 Other postherpetic nervous system involvement: Secondary | ICD-10-CM | POA: Diagnosis not present

## 2017-04-13 DIAGNOSIS — I5032 Chronic diastolic (congestive) heart failure: Secondary | ICD-10-CM | POA: Diagnosis not present

## 2017-04-13 DIAGNOSIS — M5116 Intervertebral disc disorders with radiculopathy, lumbar region: Secondary | ICD-10-CM | POA: Diagnosis not present

## 2017-04-14 DIAGNOSIS — I481 Persistent atrial fibrillation: Secondary | ICD-10-CM | POA: Diagnosis not present

## 2017-04-14 DIAGNOSIS — M545 Low back pain: Secondary | ICD-10-CM | POA: Diagnosis not present

## 2017-04-14 DIAGNOSIS — M48061 Spinal stenosis, lumbar region without neurogenic claudication: Secondary | ICD-10-CM | POA: Diagnosis not present

## 2017-04-14 DIAGNOSIS — M5126 Other intervertebral disc displacement, lumbar region: Secondary | ICD-10-CM | POA: Diagnosis not present

## 2017-04-14 DIAGNOSIS — I350 Nonrheumatic aortic (valve) stenosis: Secondary | ICD-10-CM | POA: Diagnosis not present

## 2017-04-14 DIAGNOSIS — M1991 Primary osteoarthritis, unspecified site: Secondary | ICD-10-CM | POA: Diagnosis not present

## 2017-04-14 DIAGNOSIS — M5116 Intervertebral disc disorders with radiculopathy, lumbar region: Secondary | ICD-10-CM | POA: Diagnosis not present

## 2017-04-14 DIAGNOSIS — I5032 Chronic diastolic (congestive) heart failure: Secondary | ICD-10-CM | POA: Diagnosis not present

## 2017-04-14 DIAGNOSIS — B0229 Other postherpetic nervous system involvement: Secondary | ICD-10-CM | POA: Diagnosis not present

## 2017-04-16 DIAGNOSIS — I481 Persistent atrial fibrillation: Secondary | ICD-10-CM | POA: Diagnosis not present

## 2017-04-16 DIAGNOSIS — I350 Nonrheumatic aortic (valve) stenosis: Secondary | ICD-10-CM | POA: Diagnosis not present

## 2017-04-16 DIAGNOSIS — B0229 Other postherpetic nervous system involvement: Secondary | ICD-10-CM | POA: Diagnosis not present

## 2017-04-16 DIAGNOSIS — I5032 Chronic diastolic (congestive) heart failure: Secondary | ICD-10-CM | POA: Diagnosis not present

## 2017-04-16 DIAGNOSIS — M5116 Intervertebral disc disorders with radiculopathy, lumbar region: Secondary | ICD-10-CM | POA: Diagnosis not present

## 2017-04-16 DIAGNOSIS — M1991 Primary osteoarthritis, unspecified site: Secondary | ICD-10-CM | POA: Diagnosis not present

## 2017-04-17 DIAGNOSIS — I5032 Chronic diastolic (congestive) heart failure: Secondary | ICD-10-CM | POA: Diagnosis not present

## 2017-04-17 DIAGNOSIS — B0229 Other postherpetic nervous system involvement: Secondary | ICD-10-CM | POA: Diagnosis not present

## 2017-04-17 DIAGNOSIS — I481 Persistent atrial fibrillation: Secondary | ICD-10-CM | POA: Diagnosis not present

## 2017-04-17 DIAGNOSIS — M1991 Primary osteoarthritis, unspecified site: Secondary | ICD-10-CM | POA: Diagnosis not present

## 2017-04-17 DIAGNOSIS — M5116 Intervertebral disc disorders with radiculopathy, lumbar region: Secondary | ICD-10-CM | POA: Diagnosis not present

## 2017-04-17 DIAGNOSIS — I350 Nonrheumatic aortic (valve) stenosis: Secondary | ICD-10-CM | POA: Diagnosis not present

## 2017-04-19 DIAGNOSIS — M5116 Intervertebral disc disorders with radiculopathy, lumbar region: Secondary | ICD-10-CM | POA: Diagnosis not present

## 2017-04-19 DIAGNOSIS — I481 Persistent atrial fibrillation: Secondary | ICD-10-CM | POA: Diagnosis not present

## 2017-04-19 DIAGNOSIS — M1991 Primary osteoarthritis, unspecified site: Secondary | ICD-10-CM | POA: Diagnosis not present

## 2017-04-19 DIAGNOSIS — I350 Nonrheumatic aortic (valve) stenosis: Secondary | ICD-10-CM | POA: Diagnosis not present

## 2017-04-19 DIAGNOSIS — I5032 Chronic diastolic (congestive) heart failure: Secondary | ICD-10-CM | POA: Diagnosis not present

## 2017-04-19 DIAGNOSIS — B0229 Other postherpetic nervous system involvement: Secondary | ICD-10-CM | POA: Diagnosis not present

## 2017-04-21 ENCOUNTER — Ambulatory Visit (INDEPENDENT_AMBULATORY_CARE_PROVIDER_SITE_OTHER): Payer: Medicare Other | Admitting: *Deleted

## 2017-04-21 DIAGNOSIS — I482 Chronic atrial fibrillation, unspecified: Secondary | ICD-10-CM

## 2017-04-21 DIAGNOSIS — I5032 Chronic diastolic (congestive) heart failure: Secondary | ICD-10-CM | POA: Diagnosis not present

## 2017-04-21 DIAGNOSIS — Z5181 Encounter for therapeutic drug level monitoring: Secondary | ICD-10-CM | POA: Diagnosis not present

## 2017-04-21 DIAGNOSIS — I350 Nonrheumatic aortic (valve) stenosis: Secondary | ICD-10-CM | POA: Diagnosis not present

## 2017-04-21 DIAGNOSIS — M1991 Primary osteoarthritis, unspecified site: Secondary | ICD-10-CM | POA: Diagnosis not present

## 2017-04-21 DIAGNOSIS — B0229 Other postherpetic nervous system involvement: Secondary | ICD-10-CM | POA: Diagnosis not present

## 2017-04-21 DIAGNOSIS — I481 Persistent atrial fibrillation: Secondary | ICD-10-CM | POA: Diagnosis not present

## 2017-04-21 DIAGNOSIS — M5116 Intervertebral disc disorders with radiculopathy, lumbar region: Secondary | ICD-10-CM | POA: Diagnosis not present

## 2017-04-21 LAB — POCT INR: INR: 3.2

## 2017-04-28 ENCOUNTER — Ambulatory Visit (INDEPENDENT_AMBULATORY_CARE_PROVIDER_SITE_OTHER): Payer: Medicare Other | Admitting: *Deleted

## 2017-04-28 DIAGNOSIS — B0229 Other postherpetic nervous system involvement: Secondary | ICD-10-CM | POA: Diagnosis not present

## 2017-04-28 DIAGNOSIS — Z5181 Encounter for therapeutic drug level monitoring: Secondary | ICD-10-CM | POA: Diagnosis not present

## 2017-04-28 DIAGNOSIS — M5116 Intervertebral disc disorders with radiculopathy, lumbar region: Secondary | ICD-10-CM | POA: Diagnosis not present

## 2017-04-28 DIAGNOSIS — I350 Nonrheumatic aortic (valve) stenosis: Secondary | ICD-10-CM | POA: Diagnosis not present

## 2017-04-28 DIAGNOSIS — I482 Chronic atrial fibrillation, unspecified: Secondary | ICD-10-CM

## 2017-04-28 DIAGNOSIS — I481 Persistent atrial fibrillation: Secondary | ICD-10-CM | POA: Diagnosis not present

## 2017-04-28 DIAGNOSIS — I5032 Chronic diastolic (congestive) heart failure: Secondary | ICD-10-CM | POA: Diagnosis not present

## 2017-04-28 DIAGNOSIS — M1991 Primary osteoarthritis, unspecified site: Secondary | ICD-10-CM | POA: Diagnosis not present

## 2017-04-28 LAB — POCT INR: INR: 3.8

## 2017-04-30 DIAGNOSIS — M5116 Intervertebral disc disorders with radiculopathy, lumbar region: Secondary | ICD-10-CM | POA: Diagnosis not present

## 2017-04-30 DIAGNOSIS — I350 Nonrheumatic aortic (valve) stenosis: Secondary | ICD-10-CM | POA: Diagnosis not present

## 2017-04-30 DIAGNOSIS — M1991 Primary osteoarthritis, unspecified site: Secondary | ICD-10-CM | POA: Diagnosis not present

## 2017-04-30 DIAGNOSIS — I5032 Chronic diastolic (congestive) heart failure: Secondary | ICD-10-CM | POA: Diagnosis not present

## 2017-04-30 DIAGNOSIS — B0229 Other postherpetic nervous system involvement: Secondary | ICD-10-CM | POA: Diagnosis not present

## 2017-04-30 DIAGNOSIS — I481 Persistent atrial fibrillation: Secondary | ICD-10-CM | POA: Diagnosis not present

## 2017-05-03 DIAGNOSIS — C44319 Basal cell carcinoma of skin of other parts of face: Secondary | ICD-10-CM | POA: Diagnosis not present

## 2017-05-05 ENCOUNTER — Ambulatory Visit (INDEPENDENT_AMBULATORY_CARE_PROVIDER_SITE_OTHER): Payer: Medicare Other | Admitting: *Deleted

## 2017-05-05 DIAGNOSIS — I480 Paroxysmal atrial fibrillation: Secondary | ICD-10-CM

## 2017-05-05 DIAGNOSIS — I5032 Chronic diastolic (congestive) heart failure: Secondary | ICD-10-CM | POA: Diagnosis not present

## 2017-05-05 DIAGNOSIS — Z5181 Encounter for therapeutic drug level monitoring: Secondary | ICD-10-CM

## 2017-05-05 DIAGNOSIS — B0229 Other postherpetic nervous system involvement: Secondary | ICD-10-CM | POA: Diagnosis not present

## 2017-05-05 DIAGNOSIS — M4727 Other spondylosis with radiculopathy, lumbosacral region: Secondary | ICD-10-CM | POA: Diagnosis not present

## 2017-05-05 DIAGNOSIS — I350 Nonrheumatic aortic (valve) stenosis: Secondary | ICD-10-CM | POA: Diagnosis not present

## 2017-05-05 DIAGNOSIS — M5116 Intervertebral disc disorders with radiculopathy, lumbar region: Secondary | ICD-10-CM | POA: Diagnosis not present

## 2017-05-05 DIAGNOSIS — I481 Persistent atrial fibrillation: Secondary | ICD-10-CM | POA: Diagnosis not present

## 2017-05-05 DIAGNOSIS — M1991 Primary osteoarthritis, unspecified site: Secondary | ICD-10-CM | POA: Diagnosis not present

## 2017-05-05 DIAGNOSIS — M4317 Spondylolisthesis, lumbosacral region: Secondary | ICD-10-CM | POA: Diagnosis not present

## 2017-05-05 LAB — POCT INR: INR: 1.8

## 2017-05-07 DIAGNOSIS — M5116 Intervertebral disc disorders with radiculopathy, lumbar region: Secondary | ICD-10-CM | POA: Diagnosis not present

## 2017-05-07 DIAGNOSIS — I481 Persistent atrial fibrillation: Secondary | ICD-10-CM | POA: Diagnosis not present

## 2017-05-07 DIAGNOSIS — B0229 Other postherpetic nervous system involvement: Secondary | ICD-10-CM | POA: Diagnosis not present

## 2017-05-07 DIAGNOSIS — M1991 Primary osteoarthritis, unspecified site: Secondary | ICD-10-CM | POA: Diagnosis not present

## 2017-05-07 DIAGNOSIS — I350 Nonrheumatic aortic (valve) stenosis: Secondary | ICD-10-CM | POA: Diagnosis not present

## 2017-05-07 DIAGNOSIS — I5032 Chronic diastolic (congestive) heart failure: Secondary | ICD-10-CM | POA: Diagnosis not present

## 2017-05-09 DIAGNOSIS — M545 Low back pain: Secondary | ICD-10-CM | POA: Diagnosis not present

## 2017-05-12 ENCOUNTER — Ambulatory Visit (INDEPENDENT_AMBULATORY_CARE_PROVIDER_SITE_OTHER): Payer: Medicare Other | Admitting: *Deleted

## 2017-05-12 DIAGNOSIS — I5032 Chronic diastolic (congestive) heart failure: Secondary | ICD-10-CM | POA: Diagnosis not present

## 2017-05-12 DIAGNOSIS — I4891 Unspecified atrial fibrillation: Secondary | ICD-10-CM

## 2017-05-12 DIAGNOSIS — I481 Persistent atrial fibrillation: Secondary | ICD-10-CM | POA: Diagnosis not present

## 2017-05-12 DIAGNOSIS — Z5181 Encounter for therapeutic drug level monitoring: Secondary | ICD-10-CM

## 2017-05-12 DIAGNOSIS — I350 Nonrheumatic aortic (valve) stenosis: Secondary | ICD-10-CM | POA: Diagnosis not present

## 2017-05-12 DIAGNOSIS — M5116 Intervertebral disc disorders with radiculopathy, lumbar region: Secondary | ICD-10-CM | POA: Diagnosis not present

## 2017-05-12 DIAGNOSIS — M1991 Primary osteoarthritis, unspecified site: Secondary | ICD-10-CM | POA: Diagnosis not present

## 2017-05-12 DIAGNOSIS — B0229 Other postherpetic nervous system involvement: Secondary | ICD-10-CM | POA: Diagnosis not present

## 2017-05-12 LAB — POCT INR: INR: 3

## 2017-05-14 DIAGNOSIS — I350 Nonrheumatic aortic (valve) stenosis: Secondary | ICD-10-CM | POA: Diagnosis not present

## 2017-05-14 DIAGNOSIS — M5116 Intervertebral disc disorders with radiculopathy, lumbar region: Secondary | ICD-10-CM | POA: Diagnosis not present

## 2017-05-14 DIAGNOSIS — I481 Persistent atrial fibrillation: Secondary | ICD-10-CM | POA: Diagnosis not present

## 2017-05-14 DIAGNOSIS — B0229 Other postherpetic nervous system involvement: Secondary | ICD-10-CM | POA: Diagnosis not present

## 2017-05-14 DIAGNOSIS — I5032 Chronic diastolic (congestive) heart failure: Secondary | ICD-10-CM | POA: Diagnosis not present

## 2017-05-14 DIAGNOSIS — M1991 Primary osteoarthritis, unspecified site: Secondary | ICD-10-CM | POA: Diagnosis not present

## 2017-05-17 ENCOUNTER — Telehealth: Payer: Self-pay | Admitting: *Deleted

## 2017-05-17 DIAGNOSIS — M1991 Primary osteoarthritis, unspecified site: Secondary | ICD-10-CM | POA: Diagnosis not present

## 2017-05-17 DIAGNOSIS — I5032 Chronic diastolic (congestive) heart failure: Secondary | ICD-10-CM | POA: Diagnosis not present

## 2017-05-17 DIAGNOSIS — B0229 Other postherpetic nervous system involvement: Secondary | ICD-10-CM | POA: Diagnosis not present

## 2017-05-17 DIAGNOSIS — M5116 Intervertebral disc disorders with radiculopathy, lumbar region: Secondary | ICD-10-CM | POA: Diagnosis not present

## 2017-05-17 DIAGNOSIS — I481 Persistent atrial fibrillation: Secondary | ICD-10-CM | POA: Diagnosis not present

## 2017-05-17 DIAGNOSIS — I350 Nonrheumatic aortic (valve) stenosis: Secondary | ICD-10-CM | POA: Diagnosis not present

## 2017-05-17 NOTE — Telephone Encounter (Signed)
Mrs, Lora called stating that she is going to a pain clinic in [place] and was told she might need to come off coumdin. States that [place] Nurse is suppose to contact Edrick Oh in regards to this.  Mrs. Popescu just wanted this to be an [place].

## 2017-05-17 NOTE — Telephone Encounter (Signed)
Noted  

## 2017-05-18 DIAGNOSIS — I481 Persistent atrial fibrillation: Secondary | ICD-10-CM | POA: Diagnosis not present

## 2017-05-18 DIAGNOSIS — B0229 Other postherpetic nervous system involvement: Secondary | ICD-10-CM | POA: Diagnosis not present

## 2017-05-18 DIAGNOSIS — I5032 Chronic diastolic (congestive) heart failure: Secondary | ICD-10-CM | POA: Diagnosis not present

## 2017-05-18 DIAGNOSIS — M5116 Intervertebral disc disorders with radiculopathy, lumbar region: Secondary | ICD-10-CM | POA: Diagnosis not present

## 2017-05-18 DIAGNOSIS — I350 Nonrheumatic aortic (valve) stenosis: Secondary | ICD-10-CM | POA: Diagnosis not present

## 2017-05-18 DIAGNOSIS — M1991 Primary osteoarthritis, unspecified site: Secondary | ICD-10-CM | POA: Diagnosis not present

## 2017-05-24 DIAGNOSIS — B0229 Other postherpetic nervous system involvement: Secondary | ICD-10-CM | POA: Diagnosis not present

## 2017-05-24 DIAGNOSIS — I5032 Chronic diastolic (congestive) heart failure: Secondary | ICD-10-CM | POA: Diagnosis not present

## 2017-05-24 DIAGNOSIS — I350 Nonrheumatic aortic (valve) stenosis: Secondary | ICD-10-CM | POA: Diagnosis not present

## 2017-05-24 DIAGNOSIS — M5116 Intervertebral disc disorders with radiculopathy, lumbar region: Secondary | ICD-10-CM | POA: Diagnosis not present

## 2017-05-24 DIAGNOSIS — I481 Persistent atrial fibrillation: Secondary | ICD-10-CM | POA: Diagnosis not present

## 2017-05-24 DIAGNOSIS — M1991 Primary osteoarthritis, unspecified site: Secondary | ICD-10-CM | POA: Diagnosis not present

## 2017-05-26 DIAGNOSIS — I5032 Chronic diastolic (congestive) heart failure: Secondary | ICD-10-CM | POA: Diagnosis not present

## 2017-05-26 DIAGNOSIS — I350 Nonrheumatic aortic (valve) stenosis: Secondary | ICD-10-CM | POA: Diagnosis not present

## 2017-05-26 DIAGNOSIS — M1991 Primary osteoarthritis, unspecified site: Secondary | ICD-10-CM | POA: Diagnosis not present

## 2017-05-26 DIAGNOSIS — B0229 Other postherpetic nervous system involvement: Secondary | ICD-10-CM | POA: Diagnosis not present

## 2017-05-26 DIAGNOSIS — M5116 Intervertebral disc disorders with radiculopathy, lumbar region: Secondary | ICD-10-CM | POA: Diagnosis not present

## 2017-05-26 DIAGNOSIS — I481 Persistent atrial fibrillation: Secondary | ICD-10-CM | POA: Diagnosis not present

## 2017-05-27 ENCOUNTER — Ambulatory Visit (INDEPENDENT_AMBULATORY_CARE_PROVIDER_SITE_OTHER): Payer: Medicare Other | Admitting: *Deleted

## 2017-05-27 DIAGNOSIS — I4891 Unspecified atrial fibrillation: Secondary | ICD-10-CM | POA: Diagnosis not present

## 2017-05-27 DIAGNOSIS — Z5181 Encounter for therapeutic drug level monitoring: Secondary | ICD-10-CM

## 2017-05-27 LAB — POCT INR: INR: 2.8

## 2017-05-31 DIAGNOSIS — I5032 Chronic diastolic (congestive) heart failure: Secondary | ICD-10-CM | POA: Diagnosis not present

## 2017-05-31 DIAGNOSIS — E1140 Type 2 diabetes mellitus with diabetic neuropathy, unspecified: Secondary | ICD-10-CM | POA: Diagnosis not present

## 2017-05-31 DIAGNOSIS — E1151 Type 2 diabetes mellitus with diabetic peripheral angiopathy without gangrene: Secondary | ICD-10-CM | POA: Diagnosis not present

## 2017-05-31 DIAGNOSIS — M1991 Primary osteoarthritis, unspecified site: Secondary | ICD-10-CM | POA: Diagnosis not present

## 2017-05-31 DIAGNOSIS — B0229 Other postherpetic nervous system involvement: Secondary | ICD-10-CM | POA: Diagnosis not present

## 2017-05-31 DIAGNOSIS — I481 Persistent atrial fibrillation: Secondary | ICD-10-CM | POA: Diagnosis not present

## 2017-05-31 DIAGNOSIS — M5116 Intervertebral disc disorders with radiculopathy, lumbar region: Secondary | ICD-10-CM | POA: Diagnosis not present

## 2017-05-31 DIAGNOSIS — I350 Nonrheumatic aortic (valve) stenosis: Secondary | ICD-10-CM | POA: Diagnosis not present

## 2017-06-02 DIAGNOSIS — M5116 Intervertebral disc disorders with radiculopathy, lumbar region: Secondary | ICD-10-CM | POA: Diagnosis not present

## 2017-06-02 DIAGNOSIS — I5032 Chronic diastolic (congestive) heart failure: Secondary | ICD-10-CM | POA: Diagnosis not present

## 2017-06-02 DIAGNOSIS — I481 Persistent atrial fibrillation: Secondary | ICD-10-CM | POA: Diagnosis not present

## 2017-06-02 DIAGNOSIS — I350 Nonrheumatic aortic (valve) stenosis: Secondary | ICD-10-CM | POA: Diagnosis not present

## 2017-06-02 DIAGNOSIS — B0229 Other postherpetic nervous system involvement: Secondary | ICD-10-CM | POA: Diagnosis not present

## 2017-06-02 DIAGNOSIS — M1991 Primary osteoarthritis, unspecified site: Secondary | ICD-10-CM | POA: Diagnosis not present

## 2017-06-03 DIAGNOSIS — Z961 Presence of intraocular lens: Secondary | ICD-10-CM | POA: Diagnosis not present

## 2017-06-03 DIAGNOSIS — E119 Type 2 diabetes mellitus without complications: Secondary | ICD-10-CM | POA: Diagnosis not present

## 2017-06-03 DIAGNOSIS — Z7984 Long term (current) use of oral hypoglycemic drugs: Secondary | ICD-10-CM | POA: Diagnosis not present

## 2017-06-03 DIAGNOSIS — H26493 Other secondary cataract, bilateral: Secondary | ICD-10-CM | POA: Diagnosis not present

## 2017-06-09 DIAGNOSIS — M1991 Primary osteoarthritis, unspecified site: Secondary | ICD-10-CM | POA: Diagnosis not present

## 2017-06-09 DIAGNOSIS — M5116 Intervertebral disc disorders with radiculopathy, lumbar region: Secondary | ICD-10-CM | POA: Diagnosis not present

## 2017-06-09 DIAGNOSIS — I481 Persistent atrial fibrillation: Secondary | ICD-10-CM | POA: Diagnosis not present

## 2017-06-09 DIAGNOSIS — B0229 Other postherpetic nervous system involvement: Secondary | ICD-10-CM | POA: Diagnosis not present

## 2017-06-09 DIAGNOSIS — I5032 Chronic diastolic (congestive) heart failure: Secondary | ICD-10-CM | POA: Diagnosis not present

## 2017-06-09 DIAGNOSIS — Z9181 History of falling: Secondary | ICD-10-CM | POA: Diagnosis not present

## 2017-06-09 DIAGNOSIS — I350 Nonrheumatic aortic (valve) stenosis: Secondary | ICD-10-CM | POA: Diagnosis not present

## 2017-06-09 DIAGNOSIS — Z7901 Long term (current) use of anticoagulants: Secondary | ICD-10-CM | POA: Diagnosis not present

## 2017-06-09 DIAGNOSIS — R269 Unspecified abnormalities of gait and mobility: Secondary | ICD-10-CM | POA: Diagnosis not present

## 2017-06-14 DIAGNOSIS — M1991 Primary osteoarthritis, unspecified site: Secondary | ICD-10-CM | POA: Diagnosis not present

## 2017-06-14 DIAGNOSIS — I5032 Chronic diastolic (congestive) heart failure: Secondary | ICD-10-CM | POA: Diagnosis not present

## 2017-06-14 DIAGNOSIS — I340 Nonrheumatic mitral (valve) insufficiency: Secondary | ICD-10-CM | POA: Diagnosis not present

## 2017-06-14 DIAGNOSIS — I10 Essential (primary) hypertension: Secondary | ICD-10-CM | POA: Diagnosis not present

## 2017-06-14 DIAGNOSIS — I481 Persistent atrial fibrillation: Secondary | ICD-10-CM | POA: Diagnosis not present

## 2017-06-14 DIAGNOSIS — E782 Mixed hyperlipidemia: Secondary | ICD-10-CM | POA: Diagnosis not present

## 2017-06-14 DIAGNOSIS — E1122 Type 2 diabetes mellitus with diabetic chronic kidney disease: Secondary | ICD-10-CM | POA: Diagnosis not present

## 2017-06-14 DIAGNOSIS — Z9189 Other specified personal risk factors, not elsewhere classified: Secondary | ICD-10-CM | POA: Diagnosis not present

## 2017-06-14 DIAGNOSIS — K219 Gastro-esophageal reflux disease without esophagitis: Secondary | ICD-10-CM | POA: Diagnosis not present

## 2017-06-14 DIAGNOSIS — R269 Unspecified abnormalities of gait and mobility: Secondary | ICD-10-CM | POA: Diagnosis not present

## 2017-06-14 DIAGNOSIS — B0229 Other postherpetic nervous system involvement: Secondary | ICD-10-CM | POA: Diagnosis not present

## 2017-06-14 DIAGNOSIS — M5116 Intervertebral disc disorders with radiculopathy, lumbar region: Secondary | ICD-10-CM | POA: Diagnosis not present

## 2017-06-16 DIAGNOSIS — M1991 Primary osteoarthritis, unspecified site: Secondary | ICD-10-CM | POA: Diagnosis not present

## 2017-06-16 DIAGNOSIS — M5116 Intervertebral disc disorders with radiculopathy, lumbar region: Secondary | ICD-10-CM | POA: Diagnosis not present

## 2017-06-16 DIAGNOSIS — R269 Unspecified abnormalities of gait and mobility: Secondary | ICD-10-CM | POA: Diagnosis not present

## 2017-06-16 DIAGNOSIS — I481 Persistent atrial fibrillation: Secondary | ICD-10-CM | POA: Diagnosis not present

## 2017-06-16 DIAGNOSIS — I5032 Chronic diastolic (congestive) heart failure: Secondary | ICD-10-CM | POA: Diagnosis not present

## 2017-06-16 DIAGNOSIS — B0229 Other postherpetic nervous system involvement: Secondary | ICD-10-CM | POA: Diagnosis not present

## 2017-06-17 DIAGNOSIS — I5032 Chronic diastolic (congestive) heart failure: Secondary | ICD-10-CM | POA: Diagnosis not present

## 2017-06-17 DIAGNOSIS — N182 Chronic kidney disease, stage 2 (mild): Secondary | ICD-10-CM | POA: Diagnosis not present

## 2017-06-17 DIAGNOSIS — K7581 Nonalcoholic steatohepatitis (NASH): Secondary | ICD-10-CM | POA: Diagnosis not present

## 2017-06-17 DIAGNOSIS — Z6828 Body mass index (BMI) 28.0-28.9, adult: Secondary | ICD-10-CM | POA: Diagnosis not present

## 2017-06-17 DIAGNOSIS — Z1389 Encounter for screening for other disorder: Secondary | ICD-10-CM | POA: Diagnosis not present

## 2017-06-17 DIAGNOSIS — E1122 Type 2 diabetes mellitus with diabetic chronic kidney disease: Secondary | ICD-10-CM | POA: Diagnosis not present

## 2017-06-17 DIAGNOSIS — I482 Chronic atrial fibrillation: Secondary | ICD-10-CM | POA: Diagnosis not present

## 2017-06-17 DIAGNOSIS — I10 Essential (primary) hypertension: Secondary | ICD-10-CM | POA: Diagnosis not present

## 2017-06-23 DIAGNOSIS — B0229 Other postherpetic nervous system involvement: Secondary | ICD-10-CM | POA: Diagnosis not present

## 2017-06-23 DIAGNOSIS — M1991 Primary osteoarthritis, unspecified site: Secondary | ICD-10-CM | POA: Diagnosis not present

## 2017-06-23 DIAGNOSIS — I5032 Chronic diastolic (congestive) heart failure: Secondary | ICD-10-CM | POA: Diagnosis not present

## 2017-06-23 DIAGNOSIS — I481 Persistent atrial fibrillation: Secondary | ICD-10-CM | POA: Diagnosis not present

## 2017-06-23 DIAGNOSIS — R269 Unspecified abnormalities of gait and mobility: Secondary | ICD-10-CM | POA: Diagnosis not present

## 2017-06-23 DIAGNOSIS — M5116 Intervertebral disc disorders with radiculopathy, lumbar region: Secondary | ICD-10-CM | POA: Diagnosis not present

## 2017-06-24 ENCOUNTER — Ambulatory Visit (INDEPENDENT_AMBULATORY_CARE_PROVIDER_SITE_OTHER): Payer: Medicare Other | Admitting: *Deleted

## 2017-06-24 DIAGNOSIS — I4891 Unspecified atrial fibrillation: Secondary | ICD-10-CM | POA: Diagnosis not present

## 2017-06-24 DIAGNOSIS — Z5181 Encounter for therapeutic drug level monitoring: Secondary | ICD-10-CM | POA: Diagnosis not present

## 2017-06-24 LAB — POCT INR: INR: 2.3

## 2017-06-24 NOTE — Patient Instructions (Signed)
Continue coumadin 4mg  daily except 6mg  on Wednesdays and Saturdays Continue greens/slaw/salad Recheck in 6 weeks in office

## 2017-06-28 ENCOUNTER — Ambulatory Visit (INDEPENDENT_AMBULATORY_CARE_PROVIDER_SITE_OTHER): Payer: Medicare Other | Admitting: Cardiology

## 2017-06-28 ENCOUNTER — Encounter: Payer: Self-pay | Admitting: Cardiology

## 2017-06-28 VITALS — BP 126/61 | HR 77 | Ht 65.0 in | Wt 175.6 lb

## 2017-06-28 DIAGNOSIS — I482 Chronic atrial fibrillation, unspecified: Secondary | ICD-10-CM

## 2017-06-28 DIAGNOSIS — R600 Localized edema: Secondary | ICD-10-CM

## 2017-06-28 DIAGNOSIS — I350 Nonrheumatic aortic (valve) stenosis: Secondary | ICD-10-CM | POA: Diagnosis not present

## 2017-06-28 NOTE — Progress Notes (Signed)
Cardiology Office Note  Date: 06/28/2017   ID: Sharon Little, DOB 09-25-1928, MRN [id number]  PCP: Caryl Bis, MD  Primary Cardiologist: Rozann Lesches, MD   Chief Complaint  Patient presents with  . Atrial Fibrillation    History of Present Illness: Sharon Little is an 83 y.o. female last seen in July. She is here today with her daughter for a follow-up visit. She reports no major change in stamina, fairly sedentary at baseline and uses a walker. She has had no lightheadedness or syncope, and denies chest pain.  She continues on Coumadin with follow-up in the anticoagulation clinic. Recent INR was 2.3. We discussed stopping her aspirin at this point.  I personally reviewed her ECG today which shows atrial fibrillation with left bundle branch block.  Past Medical History:  Diagnosis Date  . Chronic atrial fibrillation ([place])   . Chronic back pain   . Hyperlipidemia   . Leg edema     Past Surgical History:  Procedure Laterality Date  . CARDIAC CATHETERIZATION N/A 04/06/2016   Procedure: Left Heart Cath and Coronary Angiography;  Surgeon: Jettie Booze, MD;  Location: [facility];  Service: Cardiovascular;  Laterality: N/A;    Current Outpatient Medications  Medication Sig Dispense Refill  . acetaminophen (TYLENOL) 650 MG CR tablet Take 650 mg by mouth every 8 (eight) hours as needed for pain.     Marland Kitchen aspirin EC 81 MG EC tablet Take 1 tablet (81 mg total) by mouth daily.    . B Complex Vitamins (B COMPLEX-B12) TABS Take 1 tablet by mouth daily.     . Cholecalciferol (VITAMIN D3) 1000 UNITS tablet Take 1,000 Units by mouth daily.      Marland Kitchen diltiazem (CARDIZEM CD) 240 MG 24 hr capsule Take 240 mg by mouth daily.     . DimenhyDRINATE (TRAVEL SICKNESS PO) Take 1 tablet by mouth at bedtime as needed (headache).     . furosemide (LASIX) 40 MG tablet TAKE 1 AND 1/2 TABLETS BY MOUTH DAILY 45 tablet 6  . gabapentin (NEURONTIN) 600 MG tablet Take 600 mg by mouth 4  (four) times daily as needed (back pain).     Marland Kitchen lovastatin (MEVACOR) 20 MG tablet Take 20 mg by mouth daily with supper.     . Multiple Vitamin (MULTIVITAMIN WITH MINERALS) TABS tablet Take 1 tablet by mouth daily.    . nitroGLYCERIN (NITROSTAT) 0.4 MG SL tablet Place 1 tablet (0.4 mg total) under the tongue every 5 (five) minutes x 3 doses as needed for chest pain. 25 tablet 3  . OVER THE COUNTER MEDICATION Place 1 drop into both eyes daily. Over the counter lubricating eye drop    . ranitidine (ZANTAC) 150 MG tablet Take 150 mg by mouth 2 (two) times daily as needed for heartburn.   1  . traMADol (ULTRAM) 50 MG tablet Take 50 mg by mouth every 4 (four) hours as needed (pain).     Marland Kitchen warfarin (COUMADIN) 4 MG tablet TAKE 1 AND 1/2 TABLETS BY MOUTH DAILY EXCEPT ONE TABLET ON SUNDAY, TUESDAYS, AND THURSDAYS AT 5PM 45 tablet 3   No current facility-administered medications for this visit.    Allergies:  Penicillins   Social History: The patient  reports that she quit smoking about 49 years ago. Her smoking use included cigarettes. She has a 48.00 pack-year smoking history. she has never used smokeless tobacco. She reports that she does not drink alcohol or use drugs.  ROS:  Please see the history of present illness. Otherwise, complete review of systems is positive for hearing loss, neuropathy.  All other systems are reviewed and negative.   Physical Exam: VS:  BP 126/61   Pulse 77   Ht 5\' 5"  (1.651 m)   Wt 175 lb 9.6 oz (79.7 kg)   BMI 29.22 kg/m , BMI Body mass index is 29.22 kg/m.  Wt Readings from Last 3 Encounters:  06/28/17 175 lb 9.6 oz (79.7 kg)  12/29/16 169 lb (76.7 kg)  06/23/16 172 lb (78 kg)    General: Elderly woman using a walker. HEENT: Conjunctiva and lids normal, oropharynx clear. Neck: Supple, no elevated JVP or carotid bruits, no thyromegaly. Lungs: Clear to auscultation, nonlabored breathing at rest. Cardiac: Irregularly irregular, no S3, 3/6 systolic murmur, no  pericardial rub. Abdomen: Soft, nontender, bowel sounds present, no guarding or rebound. Extremities:1-2+ lower leg edema, distal pulses 2+.  ECG: I personally reviewed the tracing from 04/05/2016 which showed rate-controlled atrial fibrillation with left bundle branch block.  Recent Labwork:   Other Studies Reviewed Today:  Echocardiogram 04/22/2016: Study Conclusions  - Left ventricle: The cavity size was normal. Wall thickness was   increased in a pattern of mild LVH. Systolic function was normal.   The estimated ejection fraction was in the range of 55% to 60%.   Wall motion was normal; there were no regional wall motion   abnormalities. - Aortic valve: Moderately calcified annulus. Trileaflet;   moderately thickened leaflets. There was mild to moderate   stenosis. Mean gradient (S): 14 mm Hg. Valve area (VTI): 1.09   cm^2. Valve area (Vmax): 1.23 cm^2. Valve area (Vmean): 1.17   cm^2. - Mitral valve: Mildly calcified annulus. Mildly thickened leaflets   . There was mild regurgitation. Valve area by continuity equation   (using LVOT flow): 2.15 cm^2. - Left atrium: The atrium was severely dilated. - Right ventricle: The cavity size was mildly dilated. - Right atrium: The atrium was severely dilated. - Technically adequate study.  Assessment and Plan:  1. Chronic atrial fibrillation, heart rate adequately controlled on Cardizem CD and continuing on Coumadin for stroke prophylaxis. Recent INR therapeutic. No falls or bleeding problems.  2. Mild to moderate aortic stenosis, asymptomatic.  3. Increased leg edema, admits to dietary indiscretions over the holidays. She will increase her Lasix to 40 mg twice daily for 2-3 days and then return to prior dose.  Current medicines were reviewed with the patient today.   Orders Placed This Encounter  Procedures  . EKG 12-Lead    Disposition: Follow-up in 6 months.   Signed, Satira Sark, MD, [facility] 06/28/2017 3:49 PM      [facility] [place], [place], [place] [address] Phone: [phone]; Fax: [phone]

## 2017-06-28 NOTE — Patient Instructions (Signed)

## 2017-06-30 DIAGNOSIS — R269 Unspecified abnormalities of gait and mobility: Secondary | ICD-10-CM | POA: Diagnosis not present

## 2017-06-30 DIAGNOSIS — M1991 Primary osteoarthritis, unspecified site: Secondary | ICD-10-CM | POA: Diagnosis not present

## 2017-06-30 DIAGNOSIS — B0229 Other postherpetic nervous system involvement: Secondary | ICD-10-CM | POA: Diagnosis not present

## 2017-06-30 DIAGNOSIS — I5032 Chronic diastolic (congestive) heart failure: Secondary | ICD-10-CM | POA: Diagnosis not present

## 2017-06-30 DIAGNOSIS — I481 Persistent atrial fibrillation: Secondary | ICD-10-CM | POA: Diagnosis not present

## 2017-06-30 DIAGNOSIS — M5116 Intervertebral disc disorders with radiculopathy, lumbar region: Secondary | ICD-10-CM | POA: Diagnosis not present

## 2017-07-02 DIAGNOSIS — B0229 Other postherpetic nervous system involvement: Secondary | ICD-10-CM | POA: Diagnosis not present

## 2017-07-02 DIAGNOSIS — R269 Unspecified abnormalities of gait and mobility: Secondary | ICD-10-CM | POA: Diagnosis not present

## 2017-07-02 DIAGNOSIS — I5032 Chronic diastolic (congestive) heart failure: Secondary | ICD-10-CM | POA: Diagnosis not present

## 2017-07-02 DIAGNOSIS — M5116 Intervertebral disc disorders with radiculopathy, lumbar region: Secondary | ICD-10-CM | POA: Diagnosis not present

## 2017-07-02 DIAGNOSIS — I481 Persistent atrial fibrillation: Secondary | ICD-10-CM | POA: Diagnosis not present

## 2017-07-02 DIAGNOSIS — M1991 Primary osteoarthritis, unspecified site: Secondary | ICD-10-CM | POA: Diagnosis not present

## 2017-07-06 DIAGNOSIS — I5032 Chronic diastolic (congestive) heart failure: Secondary | ICD-10-CM | POA: Diagnosis not present

## 2017-07-06 DIAGNOSIS — B0229 Other postherpetic nervous system involvement: Secondary | ICD-10-CM | POA: Diagnosis not present

## 2017-07-06 DIAGNOSIS — I481 Persistent atrial fibrillation: Secondary | ICD-10-CM | POA: Diagnosis not present

## 2017-07-06 DIAGNOSIS — M5116 Intervertebral disc disorders with radiculopathy, lumbar region: Secondary | ICD-10-CM | POA: Diagnosis not present

## 2017-07-06 DIAGNOSIS — R269 Unspecified abnormalities of gait and mobility: Secondary | ICD-10-CM | POA: Diagnosis not present

## 2017-07-06 DIAGNOSIS — M1991 Primary osteoarthritis, unspecified site: Secondary | ICD-10-CM | POA: Diagnosis not present

## 2017-07-12 DIAGNOSIS — B0229 Other postherpetic nervous system involvement: Secondary | ICD-10-CM | POA: Diagnosis not present

## 2017-07-12 DIAGNOSIS — R269 Unspecified abnormalities of gait and mobility: Secondary | ICD-10-CM | POA: Diagnosis not present

## 2017-07-12 DIAGNOSIS — M5116 Intervertebral disc disorders with radiculopathy, lumbar region: Secondary | ICD-10-CM | POA: Diagnosis not present

## 2017-07-12 DIAGNOSIS — M1991 Primary osteoarthritis, unspecified site: Secondary | ICD-10-CM | POA: Diagnosis not present

## 2017-07-12 DIAGNOSIS — I5032 Chronic diastolic (congestive) heart failure: Secondary | ICD-10-CM | POA: Diagnosis not present

## 2017-07-12 DIAGNOSIS — I481 Persistent atrial fibrillation: Secondary | ICD-10-CM | POA: Diagnosis not present

## 2017-07-14 DIAGNOSIS — B0229 Other postherpetic nervous system involvement: Secondary | ICD-10-CM | POA: Diagnosis not present

## 2017-07-14 DIAGNOSIS — I5032 Chronic diastolic (congestive) heart failure: Secondary | ICD-10-CM | POA: Diagnosis not present

## 2017-07-14 DIAGNOSIS — I481 Persistent atrial fibrillation: Secondary | ICD-10-CM | POA: Diagnosis not present

## 2017-07-14 DIAGNOSIS — M5116 Intervertebral disc disorders with radiculopathy, lumbar region: Secondary | ICD-10-CM | POA: Diagnosis not present

## 2017-07-14 DIAGNOSIS — R269 Unspecified abnormalities of gait and mobility: Secondary | ICD-10-CM | POA: Diagnosis not present

## 2017-07-14 DIAGNOSIS — M1991 Primary osteoarthritis, unspecified site: Secondary | ICD-10-CM | POA: Diagnosis not present

## 2017-07-17 ENCOUNTER — Other Ambulatory Visit: Payer: Self-pay | Admitting: Cardiology

## 2017-07-19 DIAGNOSIS — M5116 Intervertebral disc disorders with radiculopathy, lumbar region: Secondary | ICD-10-CM | POA: Diagnosis not present

## 2017-07-19 DIAGNOSIS — B0229 Other postherpetic nervous system involvement: Secondary | ICD-10-CM | POA: Diagnosis not present

## 2017-07-19 DIAGNOSIS — M1991 Primary osteoarthritis, unspecified site: Secondary | ICD-10-CM | POA: Diagnosis not present

## 2017-07-19 DIAGNOSIS — I481 Persistent atrial fibrillation: Secondary | ICD-10-CM | POA: Diagnosis not present

## 2017-07-19 DIAGNOSIS — R269 Unspecified abnormalities of gait and mobility: Secondary | ICD-10-CM | POA: Diagnosis not present

## 2017-07-19 DIAGNOSIS — I5032 Chronic diastolic (congestive) heart failure: Secondary | ICD-10-CM | POA: Diagnosis not present

## 2017-07-21 DIAGNOSIS — R269 Unspecified abnormalities of gait and mobility: Secondary | ICD-10-CM | POA: Diagnosis not present

## 2017-07-21 DIAGNOSIS — M5116 Intervertebral disc disorders with radiculopathy, lumbar region: Secondary | ICD-10-CM | POA: Diagnosis not present

## 2017-07-21 DIAGNOSIS — B0229 Other postherpetic nervous system involvement: Secondary | ICD-10-CM | POA: Diagnosis not present

## 2017-07-21 DIAGNOSIS — I5032 Chronic diastolic (congestive) heart failure: Secondary | ICD-10-CM | POA: Diagnosis not present

## 2017-07-21 DIAGNOSIS — M1991 Primary osteoarthritis, unspecified site: Secondary | ICD-10-CM | POA: Diagnosis not present

## 2017-07-21 DIAGNOSIS — I481 Persistent atrial fibrillation: Secondary | ICD-10-CM | POA: Diagnosis not present

## 2017-07-26 DIAGNOSIS — M1991 Primary osteoarthritis, unspecified site: Secondary | ICD-10-CM | POA: Diagnosis not present

## 2017-07-26 DIAGNOSIS — B0229 Other postherpetic nervous system involvement: Secondary | ICD-10-CM | POA: Diagnosis not present

## 2017-07-26 DIAGNOSIS — M5116 Intervertebral disc disorders with radiculopathy, lumbar region: Secondary | ICD-10-CM | POA: Diagnosis not present

## 2017-07-26 DIAGNOSIS — I5032 Chronic diastolic (congestive) heart failure: Secondary | ICD-10-CM | POA: Diagnosis not present

## 2017-07-26 DIAGNOSIS — R269 Unspecified abnormalities of gait and mobility: Secondary | ICD-10-CM | POA: Diagnosis not present

## 2017-07-26 DIAGNOSIS — I481 Persistent atrial fibrillation: Secondary | ICD-10-CM | POA: Diagnosis not present

## 2017-07-28 DIAGNOSIS — I481 Persistent atrial fibrillation: Secondary | ICD-10-CM | POA: Diagnosis not present

## 2017-07-28 DIAGNOSIS — B0229 Other postherpetic nervous system involvement: Secondary | ICD-10-CM | POA: Diagnosis not present

## 2017-07-28 DIAGNOSIS — R269 Unspecified abnormalities of gait and mobility: Secondary | ICD-10-CM | POA: Diagnosis not present

## 2017-07-28 DIAGNOSIS — M1991 Primary osteoarthritis, unspecified site: Secondary | ICD-10-CM | POA: Diagnosis not present

## 2017-07-28 DIAGNOSIS — M5116 Intervertebral disc disorders with radiculopathy, lumbar region: Secondary | ICD-10-CM | POA: Diagnosis not present

## 2017-07-28 DIAGNOSIS — I5032 Chronic diastolic (congestive) heart failure: Secondary | ICD-10-CM | POA: Diagnosis not present

## 2017-07-29 DIAGNOSIS — H26493 Other secondary cataract, bilateral: Secondary | ICD-10-CM | POA: Diagnosis not present

## 2017-08-05 ENCOUNTER — Ambulatory Visit (INDEPENDENT_AMBULATORY_CARE_PROVIDER_SITE_OTHER): Payer: Medicare Other | Admitting: *Deleted

## 2017-08-05 DIAGNOSIS — I482 Chronic atrial fibrillation, unspecified: Secondary | ICD-10-CM

## 2017-08-05 DIAGNOSIS — Z5181 Encounter for therapeutic drug level monitoring: Secondary | ICD-10-CM

## 2017-08-05 LAB — POCT INR: INR: 2.8

## 2017-08-05 NOTE — Patient Instructions (Signed)
Continue coumadin 4mg  daily except 6mg  on Wednesdays and Saturdays Continue greens/slaw/salad Recheck in 6 weeks in office

## 2017-08-16 DIAGNOSIS — E1151 Type 2 diabetes mellitus with diabetic peripheral angiopathy without gangrene: Secondary | ICD-10-CM | POA: Diagnosis not present

## 2017-08-16 DIAGNOSIS — E1140 Type 2 diabetes mellitus with diabetic neuropathy, unspecified: Secondary | ICD-10-CM | POA: Diagnosis not present

## 2017-09-01 DIAGNOSIS — E782 Mixed hyperlipidemia: Secondary | ICD-10-CM | POA: Diagnosis not present

## 2017-09-01 DIAGNOSIS — I10 Essential (primary) hypertension: Secondary | ICD-10-CM | POA: Diagnosis not present

## 2017-09-01 DIAGNOSIS — I5032 Chronic diastolic (congestive) heart failure: Secondary | ICD-10-CM | POA: Diagnosis not present

## 2017-09-01 DIAGNOSIS — B0229 Other postherpetic nervous system involvement: Secondary | ICD-10-CM | POA: Diagnosis not present

## 2017-09-01 DIAGNOSIS — I482 Chronic atrial fibrillation: Secondary | ICD-10-CM | POA: Diagnosis not present

## 2017-09-01 DIAGNOSIS — E1122 Type 2 diabetes mellitus with diabetic chronic kidney disease: Secondary | ICD-10-CM | POA: Diagnosis not present

## 2017-09-01 DIAGNOSIS — I340 Nonrheumatic mitral (valve) insufficiency: Secondary | ICD-10-CM | POA: Diagnosis not present

## 2017-09-01 DIAGNOSIS — K7581 Nonalcoholic steatohepatitis (NASH): Secondary | ICD-10-CM | POA: Diagnosis not present

## 2017-09-06 ENCOUNTER — Other Ambulatory Visit: Payer: Self-pay | Admitting: Cardiology

## 2017-09-09 ENCOUNTER — Telehealth: Payer: Self-pay | Admitting: *Deleted

## 2017-09-09 NOTE — Telephone Encounter (Signed)
Need INR checked within one week of skin procedure that is scheduled for 09/29/17 so requested to change INR visit.

## 2017-09-23 ENCOUNTER — Ambulatory Visit (INDEPENDENT_AMBULATORY_CARE_PROVIDER_SITE_OTHER): Payer: Medicare Other | Admitting: *Deleted

## 2017-09-23 DIAGNOSIS — I482 Chronic atrial fibrillation, unspecified: Secondary | ICD-10-CM

## 2017-09-23 DIAGNOSIS — Z5181 Encounter for therapeutic drug level monitoring: Secondary | ICD-10-CM

## 2017-09-23 LAB — POCT INR: INR: 3.9

## 2017-09-23 NOTE — Patient Instructions (Addendum)
Hold coumadin tonight then decrease dose to 1 tablet daily except 1 1/2 tablets on Wednesdays Pending Mohs surgical procedure 09/29/17 in [place] Fax [phone] Continue greens/slaw/salad Recheck in 1 week in office  (09/28/17 day before procedure)  Will hold coumadin if needed for surgery on 09/29/17

## 2017-09-27 ENCOUNTER — Telehealth: Payer: Self-pay | Admitting: *Deleted

## 2017-09-27 NOTE — Telephone Encounter (Signed)
Please give pt a call  °

## 2017-09-27 NOTE — Telephone Encounter (Signed)
Called pt.  Had question about eating slaw.  Questions answered.  Has INR appt tomorrow.

## 2017-09-28 ENCOUNTER — Ambulatory Visit (INDEPENDENT_AMBULATORY_CARE_PROVIDER_SITE_OTHER): Payer: Medicare Other | Admitting: *Deleted

## 2017-09-28 DIAGNOSIS — I482 Chronic atrial fibrillation, unspecified: Secondary | ICD-10-CM

## 2017-09-28 DIAGNOSIS — Z5181 Encounter for therapeutic drug level monitoring: Secondary | ICD-10-CM

## 2017-09-28 LAB — POCT INR: INR: 2.6

## 2017-09-28 NOTE — Patient Instructions (Signed)
Take coumadin 1/2 tablet tonight then resume 1 tablet daily except 1 1/2 tablets on Wednesdays Pending Mohs surgical procedure 09/29/17 in [place] Fax [phone] Continue greens/slaw/salad as usual

## 2017-09-29 DIAGNOSIS — C44319 Basal cell carcinoma of skin of other parts of face: Secondary | ICD-10-CM | POA: Diagnosis not present

## 2017-10-12 ENCOUNTER — Ambulatory Visit (INDEPENDENT_AMBULATORY_CARE_PROVIDER_SITE_OTHER): Payer: Medicare Other | Admitting: *Deleted

## 2017-10-12 DIAGNOSIS — Z5181 Encounter for therapeutic drug level monitoring: Secondary | ICD-10-CM | POA: Diagnosis not present

## 2017-10-12 DIAGNOSIS — I482 Chronic atrial fibrillation, unspecified: Secondary | ICD-10-CM

## 2017-10-12 LAB — POCT INR: INR: 2.7

## 2017-10-12 NOTE — Patient Instructions (Signed)
Continue coumadin 1 tablet daily except 1 1/2 tablets on Wednesdays Continue greens/slaw/salad as usual

## 2017-10-29 ENCOUNTER — Encounter (HOSPITAL_COMMUNITY): Payer: Self-pay | Admitting: Emergency Medicine

## 2017-10-29 ENCOUNTER — Emergency Department (HOSPITAL_COMMUNITY): Payer: Medicare Other

## 2017-10-29 ENCOUNTER — Inpatient Hospital Stay (HOSPITAL_COMMUNITY)
Admission: EM | Admit: 2017-10-29 | Discharge: 2017-11-03 | DRG: 291 | Disposition: A | Discharge status: Skilled Nursing Facility | Payer: Medicare Other | Attending: Internal Medicine | Admitting: Internal Medicine

## 2017-10-29 DIAGNOSIS — R001 Bradycardia, unspecified: Secondary | ICD-10-CM | POA: Diagnosis not present

## 2017-10-29 DIAGNOSIS — I482 Chronic atrial fibrillation, unspecified: Secondary | ICD-10-CM | POA: Diagnosis present

## 2017-10-29 DIAGNOSIS — I509 Heart failure, unspecified: Secondary | ICD-10-CM | POA: Diagnosis not present

## 2017-10-29 DIAGNOSIS — K828 Other specified diseases of gallbladder: Secondary | ICD-10-CM | POA: Diagnosis not present

## 2017-10-29 DIAGNOSIS — I5033 Acute on chronic diastolic (congestive) heart failure: Secondary | ICD-10-CM | POA: Diagnosis present

## 2017-10-29 DIAGNOSIS — J9601 Acute respiratory failure with hypoxia: Secondary | ICD-10-CM | POA: Diagnosis present

## 2017-10-29 DIAGNOSIS — E785 Hyperlipidemia, unspecified: Secondary | ICD-10-CM | POA: Diagnosis present

## 2017-10-29 DIAGNOSIS — I472 Ventricular tachycardia: Secondary | ICD-10-CM | POA: Diagnosis present

## 2017-10-29 DIAGNOSIS — R0602 Shortness of breath: Secondary | ICD-10-CM | POA: Diagnosis not present

## 2017-10-29 DIAGNOSIS — I2510 Atherosclerotic heart disease of native coronary artery without angina pectoris: Secondary | ICD-10-CM | POA: Diagnosis present

## 2017-10-29 DIAGNOSIS — Z8249 Family history of ischemic heart disease and other diseases of the circulatory system: Secondary | ICD-10-CM

## 2017-10-29 DIAGNOSIS — R609 Edema, unspecified: Secondary | ICD-10-CM | POA: Diagnosis present

## 2017-10-29 DIAGNOSIS — R339 Retention of urine, unspecified: Secondary | ICD-10-CM | POA: Diagnosis present

## 2017-10-29 DIAGNOSIS — M549 Dorsalgia, unspecified: Secondary | ICD-10-CM | POA: Diagnosis present

## 2017-10-29 DIAGNOSIS — I481 Persistent atrial fibrillation: Secondary | ICD-10-CM | POA: Diagnosis not present

## 2017-10-29 DIAGNOSIS — I454 Nonspecific intraventricular block: Secondary | ICD-10-CM | POA: Diagnosis present

## 2017-10-29 DIAGNOSIS — Z880 Allergy status to penicillin: Secondary | ICD-10-CM

## 2017-10-29 DIAGNOSIS — I083 Combined rheumatic disorders of mitral, aortic and tricuspid valves: Secondary | ICD-10-CM | POA: Diagnosis not present

## 2017-10-29 DIAGNOSIS — Z7901 Long term (current) use of anticoagulants: Secondary | ICD-10-CM

## 2017-10-29 DIAGNOSIS — I4819 Other persistent atrial fibrillation: Secondary | ICD-10-CM

## 2017-10-29 DIAGNOSIS — R52 Pain, unspecified: Secondary | ICD-10-CM

## 2017-10-29 DIAGNOSIS — I110 Hypertensive heart disease with heart failure: Secondary | ICD-10-CM | POA: Diagnosis not present

## 2017-10-29 DIAGNOSIS — M5489 Other dorsalgia: Secondary | ICD-10-CM | POA: Diagnosis not present

## 2017-10-29 DIAGNOSIS — R079 Chest pain, unspecified: Secondary | ICD-10-CM | POA: Diagnosis not present

## 2017-10-29 DIAGNOSIS — Z7982 Long term (current) use of aspirin: Secondary | ICD-10-CM

## 2017-10-29 DIAGNOSIS — I700 Atherosclerosis of aorta: Secondary | ICD-10-CM | POA: Diagnosis not present

## 2017-10-29 DIAGNOSIS — Z79891 Long term (current) use of opiate analgesic: Secondary | ICD-10-CM

## 2017-10-29 DIAGNOSIS — Z800 Family history of malignant neoplasm of digestive organs: Secondary | ICD-10-CM

## 2017-10-29 DIAGNOSIS — R011 Cardiac murmur, unspecified: Secondary | ICD-10-CM | POA: Diagnosis present

## 2017-10-29 DIAGNOSIS — G8929 Other chronic pain: Secondary | ICD-10-CM | POA: Diagnosis present

## 2017-10-29 DIAGNOSIS — R0902 Hypoxemia: Secondary | ICD-10-CM

## 2017-10-29 DIAGNOSIS — I459 Conduction disorder, unspecified: Secondary | ICD-10-CM | POA: Diagnosis present

## 2017-10-29 DIAGNOSIS — I350 Nonrheumatic aortic (valve) stenosis: Secondary | ICD-10-CM

## 2017-10-29 DIAGNOSIS — Z66 Do not resuscitate: Secondary | ICD-10-CM | POA: Diagnosis present

## 2017-10-29 DIAGNOSIS — Z87891 Personal history of nicotine dependence: Secondary | ICD-10-CM

## 2017-10-29 DIAGNOSIS — Z79899 Other long term (current) drug therapy: Secondary | ICD-10-CM

## 2017-10-29 HISTORY — DX: Other persistent atrial fibrillation: I48.19

## 2017-10-29 HISTORY — DX: Heart failure, unspecified: I50.9

## 2017-10-29 LAB — I-STAT CHEM 8, ED
BUN: 15 mg/dL (ref 6–20)
CALCIUM ION: 1.16 mmol/L (ref 1.15–1.40)
CHLORIDE: 108 mmol/L (ref 101–111)
Creatinine, Ser: 0.9 mg/dL (ref 0.44–1.00)
Glucose, Bld: 125 mg/dL — ABNORMAL HIGH (ref 65–99)
HCT: 35 % — ABNORMAL LOW (ref 36.0–46.0)
Hemoglobin: 11.9 g/dL — ABNORMAL LOW (ref 12.0–15.0)
Potassium: 3.5 mmol/L (ref 3.5–5.1)
Sodium: 144 mmol/L (ref 135–145)
TCO2: 25 mmol/L (ref 22–32)

## 2017-10-29 LAB — CBC
HCT: 36.1 % (ref 36.0–46.0)
Hemoglobin: 11.7 g/dL — ABNORMAL LOW (ref 12.0–15.0)
MCH: 31.8 pg (ref 26.0–34.0)
MCHC: 32.4 g/dL (ref 30.0–36.0)
MCV: 98.1 fL (ref 78.0–100.0)
PLATELETS: 222 10*3/uL (ref 150–400)
RBC: 3.68 MIL/uL — ABNORMAL LOW (ref 3.87–5.11)
RDW: 14.4 % (ref 11.5–15.5)
WBC: 6.8 10*3/uL (ref 4.0–10.5)

## 2017-10-29 LAB — BRAIN NATRIURETIC PEPTIDE: B NATRIURETIC PEPTIDE 5: 337.1 pg/mL — AB (ref 0.0–100.0)

## 2017-10-29 LAB — I-STAT TROPONIN, ED
TROPONIN I, POC: 0.03 ng/mL (ref 0.00–0.08)
Troponin i, poc: 0.03 ng/mL (ref 0.00–0.08)

## 2017-10-29 LAB — COMPREHENSIVE METABOLIC PANEL
ALK PHOS: 61 U/L (ref 38–126)
ALT: 14 U/L (ref 14–54)
ANION GAP: 10 (ref 5–15)
AST: 20 U/L (ref 15–41)
Albumin: 3.9 g/dL (ref 3.5–5.0)
BILIRUBIN TOTAL: 0.5 mg/dL (ref 0.3–1.2)
BUN: 15 mg/dL (ref 6–20)
CALCIUM: 8.9 mg/dL (ref 8.9–10.3)
CO2: 24 mmol/L (ref 22–32)
Chloride: 107 mmol/L (ref 101–111)
Creatinine, Ser: 0.95 mg/dL (ref 0.44–1.00)
GFR calc non Af Amer: 52 mL/min — ABNORMAL LOW (ref >60)
GLUCOSE: 121 mg/dL — AB (ref 65–99)
Potassium: 3.6 mmol/L (ref 3.5–5.1)
Sodium: 141 mmol/L (ref 135–145)
TOTAL PROTEIN: 6.6 g/dL (ref 6.5–8.1)

## 2017-10-29 LAB — PROTIME-INR
INR: 4.02
Prothrombin Time: 38.9 seconds — ABNORMAL HIGH (ref 11.4–15.2)

## 2017-10-29 LAB — MAGNESIUM: Magnesium: 2.2 mg/dL (ref 1.7–2.4)

## 2017-10-29 MED ORDER — IOPAMIDOL (ISOVUE-370) INJECTION 76%
100.0000 mL | Freq: Once | INTRAVENOUS | Status: AC | PRN
  Administered 2017-10-29: 100 mL via INTRAVENOUS

## 2017-10-29 MED ORDER — FENTANYL CITRATE (PF) 100 MCG/2ML IJ SOLN
50.0000 ug | Freq: Once | INTRAMUSCULAR | Status: AC
  Administered 2017-10-29: 50 ug via INTRAVENOUS
  Filled 2017-10-29: qty 2

## 2017-10-29 MED ORDER — FUROSEMIDE 10 MG/ML IJ SOLN
40.0000 mg | Freq: Once | INTRAMUSCULAR | Status: AC
  Administered 2017-10-29: 40 mg via INTRAVENOUS
  Filled 2017-10-29: qty 4

## 2017-10-29 MED ORDER — IOPAMIDOL (ISOVUE-370) INJECTION 76%
INTRAVENOUS | Status: AC
  Filled 2017-10-29: qty 100

## 2017-10-29 NOTE — ED Triage Notes (Signed)
Pt here from home with c/o sob and bradycardia and low sats , pt rate in the 40's and sats 87 % on NRB with ems , pt b/p low with ems

## 2017-10-29 NOTE — ED Notes (Signed)
Pt reporting the need to urinate, purewick in place; pt states she still cannot go and wants to get out of bed, pt advised not to

## 2017-10-29 NOTE — ED Notes (Signed)
US at bedside

## 2017-10-29 NOTE — ED Provider Notes (Signed)
[facility] Provider Note   CSN: [id number] Arrival date & time: 10/29/17  1859     History   Chief Complaint Chief Complaint  Patient presents with  . Bradycardia  . Shortness of Breath    HPI SHELI DORIN is a 84 y.o. female.   The history is provided by the patient.   Shortness of Breath   This is a new problem. The current episode started 12 to 24 hours ago. The problem has been gradually worsening. Associated symptoms include chest pain (right sided with back pain on that side). Pertinent negatives include no fever, no rhinorrhea, no sore throat, no ear pain, no neck pain, no cough, no sputum production, no wheezing, no orthopnea, no vomiting, no abdominal pain, no rash, no leg pain and no leg swelling. It is unknown what precipitated the problem. She has tried nothing for the symptoms. The treatment provided no relief. She has had prior hospitalizations. Associated medical issues include CAD and heart failure. Associated medical issues do not include COPD, chronic lung disease or PE. Associated medical issues comments: atrial fibrillation on coumadin.    Past Medical History:  Diagnosis Date  . Chronic atrial fibrillation ([place])   . Chronic back pain   . Hyperlipidemia   . Leg edema     Patient Active Problem List   Diagnosis Date Noted  . Acute combined systolic and diastolic (congestive) hrt fail ([place]) 04/04/2016  . Acute on chronic diastolic CHF (congestive heart failure) ([place]) 04/03/2016  . Moderate aortic valve stenosis 04/03/2016  . Elevated troponin 04/03/2016  . Chronic atrial fibrillation ([place]) 04/09/2014  . Encounter for therapeutic drug monitoring 08/11/2013  . Back pain   . Dyslipidemia   . Edema   . MR (mitral regurgitation)   . IVCD (intraventricular conduction defect)   . Abnormal CT scan, chest   . Ejection fraction   . Warfarin anticoagulation   . Chest discomfort     Past Surgical History:  Procedure  Laterality Date  . CARDIAC CATHETERIZATION N/A 04/06/2016   Procedure: Left Heart Cath and Coronary Angiography;  Surgeon: Jettie Booze, MD;  Location: [facility];  Service: Cardiovascular;  Laterality: N/A;     OB History   None      Home Medications    Prior to Admission medications   Medication Sig Start Date End Date Taking? Authorizing Provider  acetaminophen (TYLENOL) 650 MG CR tablet Take 650 mg by mouth every 8 (eight) hours as needed for pain.    Yes [provider]  B Complex Vitamins (B COMPLEX-B12) TABS Take 1 tablet by mouth daily.    Yes [provider]  Cholecalciferol (VITAMIN D3) 1000 UNITS tablet Take 1,000 Units by mouth daily.     Yes [provider]  diltiazem (CARDIZEM CD) 240 MG 24 hr capsule Take 240 mg by mouth daily.    Yes [provider]  DimenhyDRINATE (TRAVEL SICKNESS PO) Take 1 tablet by mouth at bedtime as needed (headache).    Yes [provider]  furosemide (LASIX) 40 MG tablet TAKE 1 AND 1/2 TABLETS BY MOUTH DAILY 09/06/17  Yes Satira Sark, MD  gabapentin (NEURONTIN) 600 MG tablet Take 600 mg by mouth 4 (four) times daily.    Yes [provider]  lovastatin (MEVACOR) 20 MG tablet Take 20 mg by mouth daily with supper.    Yes [provider]  Multiple Vitamin (MULTIVITAMIN WITH MINERALS) TABS tablet Take 1  tablet by mouth daily.   Yes [provider]  nitroGLYCERIN (NITROSTAT) 0.4 MG SL tablet Place 1 tablet (0.4 mg total) under the tongue every 5 (five) minutes x 3 doses as needed for chest pain. 04/07/16  Yes Cheryln Manly, NP  OVER THE COUNTER MEDICATION Place 1 drop into both eyes daily. Over the counter lubricating eye drop   Yes [provider]  ranitidine (ZANTAC) 150 MG tablet Take 150 mg by mouth 2 (two) times daily as needed for heartburn.  03/26/16  Yes [provider]  traMADol (ULTRAM) 50 MG tablet Take 50 mg by mouth every 4  (four) hours as needed (pain).    Yes [provider]  warfarin (COUMADIN) 4 MG tablet Take 1 tablet daily except 1 1/2 tablets on Wednesdays and Saturdays Patient taking differently: Take 4 mg by mouth daily at 6 PM. Take 6 mg on Wednesday  Take 4 mg all the other days 07/19/17  Yes Satira Sark, MD  aspirin EC 81 MG EC tablet Take 1 tablet (81 mg total) by mouth daily. Patient not taking: Reported on 10/29/2017 04/08/16   Cheryln Manly, NP    Family History Family History  Problem Relation Age of Onset  . Heart disease Mother   . Colon cancer Father   . Heart disease Sister   . Heart disease Brother     Social History Social History   Tobacco Use  . Smoking status: Former Smoker    Packs/day: 2.00    Years: 24.00    Pack years: 48.00    Types: Cigarettes    Last attempt to quit: 06/29/1968    Years since quitting: 49.3  . Smokeless tobacco: Never Used  Substance Use Topics  . Alcohol use: No  . Drug use: No     Allergies   Penicillins   Review of Systems Review of Systems  Constitutional: Negative for chills and fever.  HENT: Negative for ear pain, rhinorrhea and sore throat.   Eyes: Negative for pain and visual disturbance.  Respiratory: Positive for shortness of breath. Negative for cough, sputum production and wheezing.   Cardiovascular: Positive for chest pain (right sided with back pain on that side). Negative for palpitations, orthopnea and leg swelling.  Gastrointestinal: Negative for abdominal pain and vomiting.  Genitourinary: Negative for dysuria and hematuria.  Musculoskeletal: Positive for back pain. Negative for arthralgias and neck pain.  Skin: Negative for color change and rash.  Neurological: Negative for seizures and syncope.  All other systems reviewed and are negative.    Physical Exam Updated Vital Signs  ED Triage Vitals  Enc Vitals Group     BP 10/29/17 1904 (!) 180/60     Pulse Rate 10/29/17 1907 (!) 32     Resp  10/29/17 1904 17     Temp 10/29/17 1904 98.8 F (37.1 C)     Temp Source 10/29/17 1904 Temporal     SpO2 10/29/17 1904 92 %     Weight --      Height --      Head Circumference --      Peak Flow --      Pain Score 10/29/17 1901 8     Pain Loc --      Pain Edu? --      Excl. in [place]? --     Physical Exam  Constitutional: She appears well-developed and well-nourished. She appears distressed.  HENT:  Head: Normocephalic and atraumatic.  Eyes: Pupils  are equal, round, and reactive to light. Conjunctivae and EOM are normal.  Neck: Normal range of motion. Neck supple.  Cardiovascular: Normal rate, regular rhythm, normal heart sounds and intact distal pulses.  No murmur heard. Pulmonary/Chest: Tachypnea noted. No respiratory distress. She has decreased breath sounds (coarse). She has no wheezes. She has no rhonchi. She has no rales. She exhibits tenderness (TTP to posterior chest wall on right side).  Abdominal: Soft. There is no tenderness.  Musculoskeletal: Normal range of motion. She exhibits no edema.       Right lower leg: She exhibits no edema.       Left lower leg: She exhibits no edema.  Neurological: She is alert.  Skin: Skin is warm and dry. Capillary refill takes less than 2 seconds.  Psychiatric: She has a normal mood and affect.  Nursing note and vitals reviewed.    ED Treatments / Results  Labs (all labs ordered are listed, but only abnormal results are displayed) Labs Reviewed  CBC - Abnormal; Notable for the following components:      Result Value   RBC 3.68 (*)    Hemoglobin 11.7 (*)    All other components within normal limits  BRAIN NATRIURETIC PEPTIDE - Abnormal; Notable for the following components:   B Natriuretic Peptide 337.1 (*)    All other components within normal limits  PROTIME-INR - Abnormal; Notable for the following components:   Prothrombin Time 38.9 (*)    INR 4.02 (*)    All other components within normal limits  COMPREHENSIVE METABOLIC  PANEL - Abnormal; Notable for the following components:   Glucose, Bld 121 (*)    GFR calc non Af Amer 52 (*)    All other components within normal limits  I-STAT CHEM 8, ED - Abnormal; Notable for the following components:   Glucose, Bld 125 (*)    Hemoglobin 11.9 (*)    HCT 35.0 (*)    All other components within normal limits  MAGNESIUM  CBG MONITORING, ED  I-STAT TROPONIN, ED  I-STAT TROPONIN, ED    EKG EKG Interpretation  Date/Time:  Friday Oct 29 2017 19:03:13 EDT Ventricular Rate:  58 PR Interval:    QRS Duration: 150 QT Interval:  472 QTC Calculation: 366 R Axis:   142 Text Interpretation:  Atrial fibrillation Paired ventricular premature complexes Nonspecific intraventricular conduction delay Lateral infarct, age indeterminate Abnormal ekg Confirmed by Carmin Muskrat [phone]) on 10/29/2017 7:17:30 PM   Radiology Dg Chest Portable 1 View  Result Date: 10/29/2017 CLINICAL DATA:  Shortness of breath with bradycardia EXAM: PORTABLE CHEST 1 VIEW COMPARISON:  08/25/2016 FINDINGS: Cardiomegaly with vascular congestion and mild diffuse interstitial opacity consistent with edema. Small left pleural effusion and probable small moderate right pleural effusion. Atelectasis or pneumonia at the right base. Aortic atherosclerosis. No pneumothorax. IMPRESSION: 1. Cardiomegaly with vascular congestion and mild interstitial edema 2. Right greater than left pleural effusions with atelectasis or pneumonia at the right base. Electronically Signed   By: Donavan Foil M.D.   On: 10/29/2017 20:01   Ct Angio Chest/abd/pel For Dissection W And/or Wo Contrast  Result Date: 10/29/2017 CLINICAL DATA:  84 year old female with shortness of breath, bradycardia, chest and back pain. EXAM: CT ANGIOGRAPHY CHEST, ABDOMEN AND PELVIS TECHNIQUE: Multidetector CT imaging through the chest, abdomen and pelvis was performed using the standard protocol during bolus administration of intravenous contrast. Multiplanar  reconstructed images and MIPs were obtained and reviewed to evaluate the vascular anatomy. CONTRAST:  136mL ISOVUE-370  IOPAMIDOL (ISOVUE-370) INJECTION 76% COMPARISON:  CT chest abdomen and pelvis 08/19/2015. FINDINGS: CTA CHEST FINDINGS Cardiovascular: Cardiomegaly appears stable. Extensive calcified coronary artery atherosclerosis and/or stents. Calcified aortic atherosclerosis. Negative for thoracic aortic dissection or aneurysm. Incidental 4 vessel arch configuration (normal variant). Patent proximal great vessels. There is also adequate contrast in the central pulmonary arteries which appear patent. Mediastinum/Nodes: Mildly increased mediastinal lymph nodes which appear reactive. Lungs/Pleura: Atelectatic changes to the major airways but also suspicion of aspirated or retained secretions in the trachea above the carina on series 8, image 44. Otherwise the major airways are patent. Moderate to large layering right pleural effusion with simple fluid density. Small layering left pleural effusion. Right greater than left lung compressive atelectasis. Respiratory motion artifact. Mild pulmonary septal thickening and perihilar ground-glass opacity. No bona fide consolidation identified. Musculoskeletal: Osteopenia. No acute osseous abnormality identified. Review of the MIP images confirms the above findings. CTA ABDOMEN AND PELVIS FINDINGS VASCULAR Aortoiliac calcified atherosclerosis. Negative for abdominal aortic dissection or aneurysm. The major arterial structures in the abdomen and pelvis remain patent and appear stable to the 2018 CTA. The portal venous system is not yet opacified on these images. Review of the MIP images confirms the above findings. NON-VASCULAR Hepatobiliary: The gallbladder appears partially contracted and yet indistinct with surrounding pericholecystic stranding (series 7, image 173). This is not seem related to motion artifact. Scattered small hypodense areas in the liver are stable to  decreased since 2018. No biliary ductal dilatation is evident. Pancreas: Negative. Spleen: Negative. Adrenals/Urinary Tract: Normal adrenal glands. Bilateral renal enhancement is symmetric and within normal limits. The kidneys appear stable. No hydronephrosis or hydroureter. The urinary bladder is distended with an estimated bladder volume of 634 milliliters, but otherwise appears normal. Stomach/Bowel: Moderate diverticulosis and redundancy of the sigmoid colon without active inflammation. Similar retained stool throughout the colon to the 2018 comparison. Redundant colonic flexures. Intermittent diverticulosis in the proximal large bowel. No acute large bowel inflammation. No dilated small bowel loops. There is flocculated material in the distal small bowel. The stomach is within normal limits. The duodenum appears normal aside from a chronic duodenum diverticulum measuring 3.5 centimeters on series 7, image 192. No abdominal free air.  No free fluid. Lymphatic: No lymphadenopathy. Reproductive: Surgically absent. Other: No pelvic free fluid. Musculoskeletal: Stable visualized osseous structures. Intermittent advanced lumbar spine degeneration. Osteopenia. Review of the MIP images confirms the above findings. IMPRESSION: 1. Positive for Aortic Atherosclerosis (ICD10-I70.0) but negative for aortic dissection or aneurysm. The central pulmonary arteries appear patent. 2. Apparent gallbladder inflammation, although the gallbladder appears partially contracted. Recommend follow-up Right Upper Quadrant Ultrasound to evaluate for possible acute cholecystitis. 3. Retained or aspirated secretions in the trachea, but no pneumonia at this time. 4. Moderate to large layering right and small layering left pleural effusions with compressive atelectasis. Consider superimposed acute pulmonary interstitial edema. 5. Chronic cardiomegaly and coronary artery atherosclerosis. 6. Distended urinary bladder, 634 mL. Electronically  Signed   By: Genevie Ann M.D.   On: 10/29/2017 20:41   US Abdomen Limited Ruq  Result Date: 10/29/2017 CLINICAL DATA:  84 year old female with appearance of gallbladder inflammation on CTA chest abdomen and pelvis earlier today performed for chest and back pain. EXAM: ULTRASOUND ABDOMEN LIMITED RIGHT UPPER QUADRANT COMPARISON:  CTA chest abdomen and pelvis 2011 hours on 10/29/2017. FINDINGS: Gallbladder: The gallbladder lumen is free of echogenic sludge or stones. There is mild gallbladder wall thickening measuring 4 millimeters (image 8) with suggestion of trace pericholecystic fluid (  image 14). However, no sonographic Murphy sign was elicited. Common bile duct: Diameter: 4 millimeters, normal. Liver: Several simple, benign cysts are noted including in the left lobe measuring 16 millimeters (image 37), and the central liver measuring 15 millimeters (image 41). There is a slightly more complex but benign appearing cyst also in the inferior right lobe on image 52 measuring 14 millimeters. No intrahepatic biliary ductal dilatation. Portal vein is patent on color Doppler imaging with normal direction of blood flow towards the liver. Other findings: There is a trace amount of free fluid adjacent to the liver on image 66. Negative visible right kidney. IMPRESSION: 1. Positive for mild gallbladder wall thickening and trace pericholecystic fluid, but negative for cholelithiasis or sonographic Murphy sign. Superimposed trace amount of nonspecific perihepatic free fluid. This constellation favors reactive gallbladder wall thickening rather than acute acalculus cholecystitis. 2. Several benign hepatic cysts are incidentally noted. Electronically Signed   By: Genevie Ann M.D.   On: 10/29/2017 23:11    Procedures Procedures (including critical care time)  Medications Ordered in ED Medications  iopamidol (ISOVUE-370) 76 % injection (has no administration in time range)  iopamidol (ISOVUE-370) 76 % injection 100 mL (100 mLs  Intravenous Contrast Given 10/29/17 2006)  fentaNYL (SUBLIMAZE) injection 50 mcg (50 mcg Intravenous Given 10/29/17 2218)  furosemide (LASIX) injection 40 mg (40 mg Intravenous Given 10/29/17 2340)  fentaNYL (SUBLIMAZE) injection 50 mcg (50 mcg Intravenous Given 10/29/17 2339)     Initial Impression / Assessment and Plan / ED Course  I have reviewed the triage vital signs and the nursing notes.  Pertinent labs & imaging results that were available during my care of the patient were reviewed by me and considered in my medical decision making (see chart for details).     WESTON FULCO is an 84 year old female with a history of high cholesterol, atrial fibrillation on coumadin who presents to the ED with shortness of breath.  Patient with hypoxia upon arrival but improved with 2 L of oxygen.  Patient without a fever.  Patient with some mild increased work of breathing.  Patient with a heart rate between 30s and 50s upon arrival but normal blood pressure.  Supposedly patient was hypotensive in route however is hypertensive upon arrival.  Patient complaining also of intense right-sided back pain.  Patient has normal pulses throughout.  No abdominal pain, mild leg swelling.  Patient does not endorse any specific chest pain.  Patient appears uncomfortable on exam.  Given hypoxia, bradycardia, questionable low blood pressure in route with severe back pain concern for dissection and will order CT scan.  Patient has diminished breath sounds on the right side but no history of COPD.  Does not endorse any infectious symptoms.  However, will obtain EKG, troponin, BNP, basic labs.  EKG upon arrival shows slow atrial fibrillation with a heart rate in the 40s.  No signs of ischemic changes.  Patient with troponin within normal limits.  No significant electrolyte abnormality or acute kidney injury.  No anemia.  Dissection study overall unremarkable.  No signs of PE, no dissection.  Possible signs concerning for acute  cholecystitis.  Right upper quadrant ultrasound was ordered that was overall equivocal.  Patient had normal gallbladder and liver enzymes. Doubt acute gallbladder process at this time. Cardiology was consulted by the phone given bradycardia and they recommend allowing the patient to flush out her diltiazem. We will treat her underlying likely heart failure given pleural effusion on x-ray and CT scan.  We will give IV Lasix 40 mg for likely heart failure.  Patient to be admitted to hospitalist for further care.  Patient appears volume overloaded.  Bradycardia stable at this time as patient has been normotensive throughout my care.  Patient may need a HIDA scan to further evaluate for gallbladder dysfunction however no signs for acute surgical intervention at this time.  Patient also has elevated INR at this time.  Patient remained hemodynamically stable throughout my care and admitted to the hospitalist.  Final Clinical Impressions(s) / ED Diagnoses   Final diagnoses:  Pain  Acute on chronic heart failure, unspecified heart failure type ([place])  Symptomatic bradycardia  Acute respiratory failure with hypoxia [facility])    ED Discharge Orders    None      Lennice Sites, DO 10/30/17 0005  Carmin Muskrat, MD 10/30/17 0030

## 2017-10-30 DIAGNOSIS — I459 Conduction disorder, unspecified: Secondary | ICD-10-CM | POA: Diagnosis not present

## 2017-10-30 DIAGNOSIS — I700 Atherosclerosis of aorta: Secondary | ICD-10-CM | POA: Diagnosis not present

## 2017-10-30 DIAGNOSIS — R52 Pain, unspecified: Secondary | ICD-10-CM | POA: Diagnosis not present

## 2017-10-30 DIAGNOSIS — I361 Nonrheumatic tricuspid (valve) insufficiency: Secondary | ICD-10-CM | POA: Diagnosis not present

## 2017-10-30 DIAGNOSIS — M2550 Pain in unspecified joint: Secondary | ICD-10-CM | POA: Diagnosis not present

## 2017-10-30 DIAGNOSIS — Z800 Family history of malignant neoplasm of digestive organs: Secondary | ICD-10-CM | POA: Diagnosis not present

## 2017-10-30 DIAGNOSIS — E785 Hyperlipidemia, unspecified: Secondary | ICD-10-CM | POA: Diagnosis present

## 2017-10-30 DIAGNOSIS — R001 Bradycardia, unspecified: Secondary | ICD-10-CM | POA: Diagnosis not present

## 2017-10-30 DIAGNOSIS — I110 Hypertensive heart disease with heart failure: Secondary | ICD-10-CM | POA: Diagnosis not present

## 2017-10-30 DIAGNOSIS — R609 Edema, unspecified: Secondary | ICD-10-CM | POA: Diagnosis not present

## 2017-10-30 DIAGNOSIS — R0902 Hypoxemia: Secondary | ICD-10-CM

## 2017-10-30 DIAGNOSIS — Z79899 Other long term (current) drug therapy: Secondary | ICD-10-CM | POA: Diagnosis not present

## 2017-10-30 DIAGNOSIS — I454 Nonspecific intraventricular block: Secondary | ICD-10-CM

## 2017-10-30 DIAGNOSIS — I472 Ventricular tachycardia: Secondary | ICD-10-CM | POA: Diagnosis not present

## 2017-10-30 DIAGNOSIS — Z9181 History of falling: Secondary | ICD-10-CM | POA: Diagnosis not present

## 2017-10-30 DIAGNOSIS — I509 Heart failure, unspecified: Secondary | ICD-10-CM

## 2017-10-30 DIAGNOSIS — I350 Nonrheumatic aortic (valve) stenosis: Secondary | ICD-10-CM | POA: Diagnosis not present

## 2017-10-30 DIAGNOSIS — Z87891 Personal history of nicotine dependence: Secondary | ICD-10-CM | POA: Diagnosis not present

## 2017-10-30 DIAGNOSIS — Z880 Allergy status to penicillin: Secondary | ICD-10-CM | POA: Diagnosis not present

## 2017-10-30 DIAGNOSIS — Z7982 Long term (current) use of aspirin: Secondary | ICD-10-CM | POA: Diagnosis not present

## 2017-10-30 DIAGNOSIS — I5032 Chronic diastolic (congestive) heart failure: Secondary | ICD-10-CM | POA: Diagnosis not present

## 2017-10-30 DIAGNOSIS — J9601 Acute respiratory failure with hypoxia: Secondary | ICD-10-CM | POA: Diagnosis not present

## 2017-10-30 DIAGNOSIS — I10 Essential (primary) hypertension: Secondary | ICD-10-CM | POA: Diagnosis not present

## 2017-10-30 DIAGNOSIS — I25118 Atherosclerotic heart disease of native coronary artery with other forms of angina pectoris: Secondary | ICD-10-CM | POA: Diagnosis not present

## 2017-10-30 DIAGNOSIS — R339 Retention of urine, unspecified: Secondary | ICD-10-CM | POA: Diagnosis not present

## 2017-10-30 DIAGNOSIS — M549 Dorsalgia, unspecified: Secondary | ICD-10-CM | POA: Diagnosis not present

## 2017-10-30 DIAGNOSIS — I083 Combined rheumatic disorders of mitral, aortic and tricuspid valves: Secondary | ICD-10-CM | POA: Diagnosis not present

## 2017-10-30 DIAGNOSIS — Z8249 Family history of ischemic heart disease and other diseases of the circulatory system: Secondary | ICD-10-CM | POA: Diagnosis not present

## 2017-10-30 DIAGNOSIS — G8929 Other chronic pain: Secondary | ICD-10-CM | POA: Diagnosis present

## 2017-10-30 DIAGNOSIS — Z79891 Long term (current) use of opiate analgesic: Secondary | ICD-10-CM | POA: Diagnosis not present

## 2017-10-30 DIAGNOSIS — I5033 Acute on chronic diastolic (congestive) heart failure: Secondary | ICD-10-CM | POA: Diagnosis not present

## 2017-10-30 DIAGNOSIS — R2689 Other abnormalities of gait and mobility: Secondary | ICD-10-CM | POA: Diagnosis not present

## 2017-10-30 DIAGNOSIS — I4891 Unspecified atrial fibrillation: Secondary | ICD-10-CM | POA: Diagnosis not present

## 2017-10-30 DIAGNOSIS — Z66 Do not resuscitate: Secondary | ICD-10-CM | POA: Diagnosis present

## 2017-10-30 DIAGNOSIS — Z7401 Bed confinement status: Secondary | ICD-10-CM | POA: Diagnosis not present

## 2017-10-30 DIAGNOSIS — Z7901 Long term (current) use of anticoagulants: Secondary | ICD-10-CM | POA: Diagnosis not present

## 2017-10-30 DIAGNOSIS — K219 Gastro-esophageal reflux disease without esophagitis: Secondary | ICD-10-CM | POA: Diagnosis not present

## 2017-10-30 DIAGNOSIS — I2510 Atherosclerotic heart disease of native coronary artery without angina pectoris: Secondary | ICD-10-CM | POA: Diagnosis present

## 2017-10-30 DIAGNOSIS — E119 Type 2 diabetes mellitus without complications: Secondary | ICD-10-CM | POA: Diagnosis not present

## 2017-10-30 DIAGNOSIS — E876 Hypokalemia: Secondary | ICD-10-CM | POA: Diagnosis not present

## 2017-10-30 DIAGNOSIS — I482 Chronic atrial fibrillation: Secondary | ICD-10-CM | POA: Diagnosis not present

## 2017-10-30 DIAGNOSIS — I481 Persistent atrial fibrillation: Secondary | ICD-10-CM | POA: Diagnosis not present

## 2017-10-30 DIAGNOSIS — M6281 Muscle weakness (generalized): Secondary | ICD-10-CM | POA: Diagnosis not present

## 2017-10-30 DIAGNOSIS — R011 Cardiac murmur, unspecified: Secondary | ICD-10-CM | POA: Diagnosis present

## 2017-10-30 LAB — BASIC METABOLIC PANEL
Anion gap: 11 (ref 5–15)
BUN: 14 mg/dL (ref 6–20)
CALCIUM: 9.4 mg/dL (ref 8.9–10.3)
CO2: 23 mmol/L (ref 22–32)
CREATININE: 0.89 mg/dL (ref 0.44–1.00)
Chloride: 108 mmol/L (ref 101–111)
GFR calc Af Amer: >60 mL/min (ref >60)
GFR, EST NON AFRICAN AMERICAN: 57 mL/min — AB (ref >60)
Glucose, Bld: 122 mg/dL — ABNORMAL HIGH (ref 65–99)
POTASSIUM: 3.9 mmol/L (ref 3.5–5.1)
SODIUM: 142 mmol/L (ref 135–145)

## 2017-10-30 LAB — PROTIME-INR
INR: 3.77
Prothrombin Time: 36.9 seconds — ABNORMAL HIGH (ref 11.4–15.2)

## 2017-10-30 LAB — CBC
HCT: 38.3 % (ref 36.0–46.0)
Hemoglobin: 12.4 g/dL (ref 12.0–15.0)
MCH: 32 pg (ref 26.0–34.0)
MCHC: 32.4 g/dL (ref 30.0–36.0)
MCV: 99 fL (ref 78.0–100.0)
PLATELETS: 198 10*3/uL (ref 150–400)
RBC: 3.87 MIL/uL (ref 3.87–5.11)
RDW: 14.7 % (ref 11.5–15.5)
WBC: 9 10*3/uL (ref 4.0–10.5)

## 2017-10-30 LAB — CREATININE, SERUM
CREATININE: 0.89 mg/dL (ref 0.44–1.00)
GFR, EST NON AFRICAN AMERICAN: 57 mL/min — AB (ref >60)

## 2017-10-30 LAB — TROPONIN I
Troponin I: 0.07 ng/mL (ref <0.03)
Troponin I: 0.06 ng/mL (ref <0.03)

## 2017-10-30 MED ORDER — HYDROMORPHONE HCL 1 MG/ML IJ SOLN
1.0000 mg | INTRAMUSCULAR | Status: DC | PRN
  Administered 2017-10-30 – 2017-11-03 (×13): 1 mg via INTRAVENOUS
  Filled 2017-10-30 (×14): qty 1

## 2017-10-30 MED ORDER — ALBUTEROL SULFATE (2.5 MG/3ML) 0.083% IN NEBU
2.5000 mg | INHALATION_SOLUTION | Freq: Four times a day (QID) | RESPIRATORY_TRACT | Status: DC
  Administered 2017-10-30 – 2017-11-01 (×7): 2.5 mg via RESPIRATORY_TRACT
  Filled 2017-10-30 (×8): qty 3

## 2017-10-30 MED ORDER — FUROSEMIDE 10 MG/ML IJ SOLN
40.0000 mg | Freq: Two times a day (BID) | INTRAMUSCULAR | Status: DC
  Administered 2017-10-30 – 2017-11-03 (×9): 40 mg via INTRAVENOUS
  Filled 2017-10-30 (×9): qty 4

## 2017-10-30 MED ORDER — FUROSEMIDE 10 MG/ML IJ SOLN
60.0000 mg | Freq: Once | INTRAMUSCULAR | Status: AC
  Administered 2017-10-30: 60 mg via INTRAVENOUS
  Filled 2017-10-30: qty 6

## 2017-10-30 MED ORDER — GABAPENTIN 600 MG PO TABS
600.0000 mg | ORAL_TABLET | Freq: Four times a day (QID) | ORAL | Status: DC
  Administered 2017-10-30 – 2017-11-01 (×8): 600 mg via ORAL
  Filled 2017-10-30 (×9): qty 1

## 2017-10-30 MED ORDER — POTASSIUM CHLORIDE CRYS ER 20 MEQ PO TBCR
20.0000 meq | EXTENDED_RELEASE_TABLET | Freq: Two times a day (BID) | ORAL | Status: DC
  Administered 2017-10-30 – 2017-11-03 (×8): 20 meq via ORAL
  Filled 2017-10-30 (×9): qty 1

## 2017-10-30 MED ORDER — ENOXAPARIN SODIUM 40 MG/0.4ML ~~LOC~~ SOLN: 40.0000 mg | SUBCUTANEOUS | Status: DC

## 2017-10-30 MED ORDER — SODIUM CHLORIDE 0.9 % IV SOLN: 250.0000 mL | INTRAVENOUS | Status: DC | PRN

## 2017-10-30 MED ORDER — TRAMADOL HCL 50 MG PO TABS
50.0000 mg | ORAL_TABLET | ORAL | Status: DC | PRN
  Administered 2017-10-30 – 2017-11-03 (×5): 50 mg via ORAL
  Filled 2017-10-30 (×7): qty 1

## 2017-10-30 MED ORDER — SODIUM CHLORIDE 0.9% FLUSH
3.0000 mL | Freq: Two times a day (BID) | INTRAVENOUS | Status: DC
  Administered 2017-10-30 – 2017-11-02 (×8): 3 mL via INTRAVENOUS

## 2017-10-30 MED ORDER — SODIUM CHLORIDE 0.9% FLUSH: 3.0000 mL | INTRAVENOUS | Status: DC | PRN

## 2017-10-30 NOTE — Consult Note (Signed)
Reason for Consult: shortness of breath, bradycardia, AS   Requesting Physician/Service: [facility]   PCP:  Caryl Bis, MD Primary Cardiologist:[place]  HPI:  The patient is a very pleasant 84 year old female with history of moderate aortic stenosis, diastolic congestive heart failure, permanent atrial fibrillation on Coumadin and diltiazem for rate control presenting with worsening shortness of breath as her primary complaint.  She states that while at home she was having lots of dyspnea on exertion which has been building for at least a week.  She called [facility] which brought her here.  No significant chest pain per the patient on my review.  She had attempted to increase her Lasix dosing at home without improvement in her symptoms.  In the emergency room there was some question of right sided flank or back pain which prompted a chest CT.  This revealed pleural effusions as well as what was possibly described as cholecystitis concerns.  Subsequently this led to an ultrasound which concluded just gallbladder thickening not cholecystitis.  Also in the emergency room she was found to be hypertensive with pulse rate in the 40s-60s with ectopy.  On interview, she appears well feels well and having no chest pain, lightheadedness, shortness of breath at current.  She states that most of her symptoms are with exertion.  She does complain of some lower extremity swelling as well.  No GI distress or bleeding troubles.  Previous Cardiac Studies: Echo Study Conclusions  - Left ventricle: The cavity size was normal. Wall thickness was   increased in a pattern of mild LVH. Systolic function was normal.   The estimated ejection fraction was in the range of 55% to 60%.   Wall motion was normal; there were no regional wall motion   abnormalities. - Aortic valve: Moderately calcified annulus. Trileaflet;   moderately thickened leaflets. There was mild to moderate   stenosis. Mean gradient  (S): 14 mm Hg. Valve area (VTI): 1.09   cm^2. Valve area (Vmax): 1.23 cm^2. Valve area (Vmean): 1.17   cm^2. - Mitral valve: Mildly calcified annulus. Mildly thickened leaflets   . There was mild regurgitation. Valve area by continuity equation   (using LVOT flow): 2.15 cm^2. - Left atrium: The atrium was severely dilated. - Right ventricle: The cavity size was mildly dilated. - Right atrium: The atrium was severely dilated. - Technically adequate study.  Cath 04/06/16 Conclusion     Dist Cx lesion, 50-60% stenosed, eccentric and calcified.  Mid LAD lesion, 50-60% stenosed, severely calcified and eccentric.  The left ventricular ejection fraction is 50-55% by visual estimate.  The left ventricular systolic function is normal.  There is no aortic valve stenosis.  LV end diastolic pressure is normal.     Past Medical History:  Diagnosis Date  . Chronic atrial fibrillation ([place])   . Chronic back pain   . Hyperlipidemia   . Leg edema     Past Surgical History:  Procedure Laterality Date  . CARDIAC CATHETERIZATION N/A 04/06/2016   Procedure: Left Heart Cath and Coronary Angiography;  Surgeon: Jettie Booze, MD;  Location: [facility];  Service: Cardiovascular;  Laterality: N/A;    Family History  Problem Relation Age of Onset  . Heart disease Mother   . Colon cancer Father   . Heart disease Sister   . Heart disease Brother    Social History:  reports that she quit smoking about 49 years ago. Her smoking use included cigarettes. She has a 48.00  pack-year smoking history. She has never used smokeless tobacco. She reports that she does not drink alcohol or use drugs.  Allergies:  Allergies  Allergen Reactions  . Penicillins Swelling and Rash    Has patient had a PCN reaction causing immediate rash, facial/tongue/throat swelling, SOB or lightheadedness with hypotension: Yes Has patient had a PCN reaction causing severe rash involving mucus membranes or  skin necrosis: No Has patient had a PCN reaction that required hospitalization No Has patient had a PCN reaction occurring within the last 10 years: No If all of the above answers are "NO", then may proceed with Cephalosporin use.    No current facility-administered medications on file prior to encounter.    Current Outpatient Medications on File Prior to Encounter  Medication Sig Dispense Refill  . acetaminophen (TYLENOL) 650 MG CR tablet Take 650 mg by mouth every 8 (eight) hours as needed for pain.     . B Complex Vitamins (B COMPLEX-B12) TABS Take 1 tablet by mouth daily.     . Cholecalciferol (VITAMIN D3) 1000 UNITS tablet Take 1,000 Units by mouth daily.      Marland Kitchen diltiazem (CARDIZEM CD) 240 MG 24 hr capsule Take 240 mg by mouth daily.     . DimenhyDRINATE (TRAVEL SICKNESS PO) Take 1 tablet by mouth at bedtime as needed (headache).     . furosemide (LASIX) 40 MG tablet TAKE 1 AND 1/2 TABLETS BY MOUTH DAILY 45 tablet 3  . gabapentin (NEURONTIN) 600 MG tablet Take 600 mg by mouth 4 (four) times daily.     Marland Kitchen lovastatin (MEVACOR) 20 MG tablet Take 20 mg by mouth daily with supper.     . Multiple Vitamin (MULTIVITAMIN WITH MINERALS) TABS tablet Take 1 tablet by mouth daily.    . nitroGLYCERIN (NITROSTAT) 0.4 MG SL tablet Place 1 tablet (0.4 mg total) under the tongue every 5 (five) minutes x 3 doses as needed for chest pain. 25 tablet 3  . OVER THE COUNTER MEDICATION Place 1 drop into both eyes daily. Over the counter lubricating eye drop    . ranitidine (ZANTAC) 150 MG tablet Take 150 mg by mouth 2 (two) times daily as needed for heartburn.   1  . traMADol (ULTRAM) 50 MG tablet Take 50 mg by mouth every 4 (four) hours as needed (pain).     Marland Kitchen warfarin (COUMADIN) 4 MG tablet Take 1 tablet daily except 1 1/2 tablets on Wednesdays and Saturdays (Patient taking differently: Take 4 mg by mouth daily at 6 PM. Take 6 mg on Wednesday  Take 4 mg all the other days) 45 tablet 6  . aspirin EC 81 MG EC  tablet Take 1 tablet (81 mg total) by mouth daily. (Patient not taking: Reported on 10/29/2017)      Results for orders placed or performed during the hospital encounter of 10/29/17 (from the past 48 hour(s))  Brain natriuretic peptide (order if patient c/o SOB ONLY)     Status: Abnormal   Collection Time: 10/29/17  7:02 PM  Result Value Ref Range   B Natriuretic Peptide 337.1 (H) 0.0 - 100.0 pg/mL    Comment: Performed at [facility], [address], [place] [address]  CBC     Status: Abnormal   Collection Time: 10/29/17  7:11 PM  Result Value Ref Range   WBC 6.8 4.0 - 10.5 K/uL   RBC 3.68 (L) 3.87 - 5.11 MIL/uL   Hemoglobin 11.7 (L) 12.0 - 15.0 g/dL  HCT 36.1 36.0 - 46.0 %   MCV 98.1 78.0 - 100.0 fL   MCH 31.8 26.0 - 34.0 pg   MCHC 32.4 30.0 - 36.0 g/dL   RDW 14.4 11.5 - 15.5 %   Platelets 222 150 - 400 K/uL    Comment: Performed at [facility], [place] [address], [place], [place] [address]  Magnesium     Status: None   Collection Time: 10/29/17  7:11 PM  Result Value Ref Range   Magnesium 2.2 1.7 - 2.4 mg/dL    Comment: Performed at [facility], [place] [address] [place], [place] [address]  Protime-INR     Status: Abnormal   Collection Time: 10/29/17  7:11 PM  Result Value Ref Range   Prothrombin Time 38.9 (H) 11.4 - 15.2 seconds    Comment: CORRECTED ON 05/03 AT 2044: PREVIOUSLY REPORTED AS 38.0   INR 4.02 (HH)     Comment: REPEATED TO VERIFY CRITICAL RESULT CALLED TO, READ BACK BY AND VERIFIED WITH: SARA WALLACE,RN AT 2043 10/29/17 BY ZBEECH. Performed at [facility], [place] [address], [place], [place] [address] CORRECTED ON 05/03 AT 2044: PREVIOUSLY REPORTED AS 3.91   I-stat troponin, ED (0, 3)     Status: None   Collection Time: 10/29/17  7:19 PM  Result Value Ref Range   Troponin i, poc 0.03 0.00 - 0.08 ng/mL   Comment 3            Comment: Due to the release kinetics of cTnI, a negative result within the first hours of the onset of  symptoms does not rule out myocardial infarction with certainty. If myocardial infarction is still suspected, repeat the test at appropriate intervals.   I-stat chem 8, ed     Status: Abnormal   Collection Time: 10/29/17  7:22 PM  Result Value Ref Range   Sodium 144 135 - 145 mmol/L   Potassium 3.5 3.5 - 5.1 mmol/L   Chloride 108 101 - 111 mmol/L   BUN 15 6 - 20 mg/dL   Creatinine, Ser 0.90 0.44 - 1.00 mg/dL   Glucose, Bld 125 (H) 65 - 99 mg/dL   Calcium, Ion 1.16 1.15 - 1.40 mmol/L   TCO2 25 22 - 32 mmol/L   Hemoglobin 11.9 (L) 12.0 - 15.0 g/dL   HCT 35.0 (L) 36.0 - 46.0 %  Comprehensive metabolic panel     Status: Abnormal   Collection Time: 10/29/17  9:25 PM  Result Value Ref Range   Sodium 141 135 - 145 mmol/L   Potassium 3.6 3.5 - 5.1 mmol/L   Chloride 107 101 - 111 mmol/L   CO2 24 22 - 32 mmol/L   Glucose, Bld 121 (H) 65 - 99 mg/dL   BUN 15 6 - 20 mg/dL   Creatinine, Ser 0.95 0.44 - 1.00 mg/dL   Calcium 8.9 8.9 - 10.3 mg/dL   Total Protein 6.6 6.5 - 8.1 g/dL   Albumin 3.9 3.5 - 5.0 g/dL   AST 20 15 - 41 U/L   ALT 14 14 - 54 U/L   Alkaline Phosphatase 61 38 - 126 U/L   Total Bilirubin 0.5 0.3 - 1.2 mg/dL   GFR calc non Af Amer 52 (L) >60 mL/min   GFR calc Af Amer >60 >60 mL/min    Comment: (NOTE) The eGFR has been calculated using the CKD EPI equation. This calculation has not been validated in all clinical situations. eGFR's persistently <60 mL/min signify possible  Chronic Kidney Disease.    Anion gap 10 5 - 15    Comment: Performed at [facility] [address], [place], [place] [address]  I-stat troponin, ED (0, 3)     Status: None   Collection Time: 10/29/17 11:00 PM  Result Value Ref Range   Troponin i, poc 0.03 0.00 - 0.08 ng/mL   Comment 3            Comment: Due to the release kinetics of cTnI, a negative result within the first hours of the onset of symptoms does not rule out myocardial infarction with certainty. If myocardial infarction is  still suspected, repeat the test at appropriate intervals.   CBC     Status: None   Collection Time: 10/30/17  3:33 AM  Result Value Ref Range   WBC 9.0 4.0 - 10.5 K/uL   RBC 3.87 3.87 - 5.11 MIL/uL   Hemoglobin 12.4 12.0 - 15.0 g/dL   HCT 38.3 36.0 - 46.0 %   MCV 99.0 78.0 - 100.0 fL   MCH 32.0 26.0 - 34.0 pg   MCHC 32.4 30.0 - 36.0 g/dL   RDW 14.7 11.5 - 15.5 %   Platelets 198 150 - 400 K/uL    Comment: Performed at [facility], [place] [address], [place], [place] [address]  Creatinine, serum     Status: Abnormal   Collection Time: 10/30/17  3:33 AM  Result Value Ref Range   Creatinine, Ser 0.89 0.44 - 1.00 mg/dL   GFR calc non Af Amer 57 (L) >60 mL/min   GFR calc Af Amer >60 >60 mL/min    Comment: (NOTE) The eGFR has been calculated using the CKD EPI equation. This calculation has not been validated in all clinical situations. eGFR's persistently <60 mL/min signify possible Chronic Kidney Disease. Performed at [facility], [place] [address] [place], [place] [address]   Basic metabolic panel     Status: Abnormal   Collection Time: 10/30/17  3:33 AM  Result Value Ref Range   Sodium 142 135 - 145 mmol/L   Potassium 3.9 3.5 - 5.1 mmol/L   Chloride 108 101 - 111 mmol/L   CO2 23 22 - 32 mmol/L   Glucose, Bld 122 (H) 65 - 99 mg/dL   BUN 14 6 - 20 mg/dL   Creatinine, Ser 0.89 0.44 - 1.00 mg/dL   Calcium 9.4 8.9 - 10.3 mg/dL   GFR calc non Af Amer 57 (L) >60 mL/min   GFR calc Af Amer >60 >60 mL/min    Comment: (NOTE) The eGFR has been calculated using the CKD EPI equation. This calculation has not been validated in all clinical situations. eGFR's persistently <60 mL/min signify possible Chronic Kidney Disease.    Anion gap 11 5 - 15    Comment: Performed at [facility] [address], [place], [place] [address]  Protime-INR     Status: Abnormal   Collection Time: 10/30/17  3:33 AM  Result Value Ref Range   Prothrombin Time 36.9 (H) 11.4 - 15.2 seconds     INR 3.77     Comment: Performed at [facility] [address] [place], [place] [address]  Troponin I (q 6hr x 3)     Status: Abnormal   Collection Time: 10/30/17  3:33 AM  Result Value Ref Range   Troponin I 0.07 (HH) <0.03 ng/mL    Comment: CRITICAL RESULT CALLED TO, READ BACK BY AND  VERIFIED WITH: BOWEN,A RN 10/30/2017 0507 JORDANS Performed at [facility], [place] [address], [place], [place] [address]    Dg Chest Portable 1 View  Result Date: 10/29/2017 CLINICAL DATA:  Shortness of breath with bradycardia EXAM: PORTABLE CHEST 1 VIEW COMPARISON:  08/25/2016 FINDINGS: Cardiomegaly with vascular congestion and mild diffuse interstitial opacity consistent with edema. Small left pleural effusion and probable small moderate right pleural effusion. Atelectasis or pneumonia at the right base. Aortic atherosclerosis. No pneumothorax. IMPRESSION: 1. Cardiomegaly with vascular congestion and mild interstitial edema 2. Right greater than left pleural effusions with atelectasis or pneumonia at the right base. Electronically Signed   By: Donavan Foil M.D.   On: 10/29/2017 20:01   Ct Angio Chest/abd/pel For Dissection W And/or Wo Contrast  Result Date: 10/29/2017 CLINICAL DATA:  84 year old female with shortness of breath, bradycardia, chest and back pain. EXAM: CT ANGIOGRAPHY CHEST, ABDOMEN AND PELVIS TECHNIQUE: Multidetector CT imaging through the chest, abdomen and pelvis was performed using the standard protocol during bolus administration of intravenous contrast. Multiplanar reconstructed images and MIPs were obtained and reviewed to evaluate the vascular anatomy. CONTRAST:  132m ISOVUE-370 IOPAMIDOL (ISOVUE-370) INJECTION 76% COMPARISON:  CT chest abdomen and pelvis 08/19/2015. FINDINGS: CTA CHEST FINDINGS Cardiovascular: Cardiomegaly appears stable. Extensive calcified coronary artery atherosclerosis and/or stents. Calcified aortic atherosclerosis. Negative for thoracic aortic dissection  or aneurysm. Incidental 4 vessel arch configuration (normal variant). Patent proximal great vessels. There is also adequate contrast in the central pulmonary arteries which appear patent. Mediastinum/Nodes: Mildly increased mediastinal lymph nodes which appear reactive. Lungs/Pleura: Atelectatic changes to the major airways but also suspicion of aspirated or retained secretions in the trachea above the carina on series 8, image 44. Otherwise the major airways are patent. Moderate to large layering right pleural effusion with simple fluid density. Small layering left pleural effusion. Right greater than left lung compressive atelectasis. Respiratory motion artifact. Mild pulmonary septal thickening and perihilar ground-glass opacity. No bona fide consolidation identified. Musculoskeletal: Osteopenia. No acute osseous abnormality identified. Review of the MIP images confirms the above findings. CTA ABDOMEN AND PELVIS FINDINGS VASCULAR Aortoiliac calcified atherosclerosis. Negative for abdominal aortic dissection or aneurysm. The major arterial structures in the abdomen and pelvis remain patent and appear stable to the 2018 CTA. The portal venous system is not yet opacified on these images. Review of the MIP images confirms the above findings. NON-VASCULAR Hepatobiliary: The gallbladder appears partially contracted and yet indistinct with surrounding pericholecystic stranding (series 7, image 173). This is not seem related to motion artifact. Scattered small hypodense areas in the liver are stable to decreased since 2018. No biliary ductal dilatation is evident. Pancreas: Negative. Spleen: Negative. Adrenals/Urinary Tract: Normal adrenal glands. Bilateral renal enhancement is symmetric and within normal limits. The kidneys appear stable. No hydronephrosis or hydroureter. The urinary bladder is distended with an estimated bladder volume of 634 milliliters, but otherwise appears normal. Stomach/Bowel: Moderate  diverticulosis and redundancy of the sigmoid colon without active inflammation. Similar retained stool throughout the colon to the 2018 comparison. Redundant colonic flexures. Intermittent diverticulosis in the proximal large bowel. No acute large bowel inflammation. No dilated small bowel loops. There is flocculated material in the distal small bowel. The stomach is within normal limits. The duodenum appears normal aside from a chronic duodenum diverticulum measuring 3.5 centimeters on series 7, image 192. No abdominal free air.  No free fluid. Lymphatic: No lymphadenopathy. Reproductive: Surgically absent. Other: No pelvic free fluid. Musculoskeletal: Stable visualized osseous structures. Intermittent advanced  lumbar spine degeneration. Osteopenia. Review of the MIP images confirms the above findings. IMPRESSION: 1. Positive for Aortic Atherosclerosis (ICD10-I70.0) but negative for aortic dissection or aneurysm. The central pulmonary arteries appear patent. 2. Apparent gallbladder inflammation, although the gallbladder appears partially contracted. Recommend follow-up Right Upper Quadrant Ultrasound to evaluate for possible acute cholecystitis. 3. Retained or aspirated secretions in the trachea, but no pneumonia at this time. 4. Moderate to large layering right and small layering left pleural effusions with compressive atelectasis. Consider superimposed acute pulmonary interstitial edema. 5. Chronic cardiomegaly and coronary artery atherosclerosis. 6. Distended urinary bladder, 634 mL. Electronically Signed   By: Genevie Ann M.D.   On: 10/29/2017 20:41   US Abdomen Limited Ruq  Result Date: 10/29/2017 CLINICAL DATA:  83 year old female with appearance of gallbladder inflammation on CTA chest abdomen and pelvis earlier today performed for chest and back pain. EXAM: ULTRASOUND ABDOMEN LIMITED RIGHT UPPER QUADRANT COMPARISON:  CTA chest abdomen and pelvis 2011 hours on 10/29/2017. FINDINGS: Gallbladder: The  gallbladder lumen is free of echogenic sludge or stones. There is mild gallbladder wall thickening measuring 4 millimeters (image 8) with suggestion of trace pericholecystic fluid (image 14). However, no sonographic Murphy sign was elicited. Common bile duct: Diameter: 4 millimeters, normal. Liver: Several simple, benign cysts are noted including in the left lobe measuring 16 millimeters (image 37), and the central liver measuring 15 millimeters (image 41). There is a slightly more complex but benign appearing cyst also in the inferior right lobe on image 52 measuring 14 millimeters. No intrahepatic biliary ductal dilatation. Portal vein is patent on color Doppler imaging with normal direction of blood flow towards the liver. Other findings: There is a trace amount of free fluid adjacent to the liver on image 66. Negative visible right kidney. IMPRESSION: 1. Positive for mild gallbladder wall thickening and trace pericholecystic fluid, but negative for cholelithiasis or sonographic Murphy sign. Superimposed trace amount of nonspecific perihepatic free fluid. This constellation favors reactive gallbladder wall thickening rather than acute acalculus cholecystitis. 2. Several benign hepatic cysts are incidentally noted. Electronically Signed   By: Genevie Ann M.D.   On: 10/29/2017 23:11    ECG/TELE: Atrial fibrillation with slow ventricular response heart rate in the 40s on most recent EKG.  ROS: As above. Otherwise, review of systems is negative unless per above HPI  Vitals:   10/30/17 0015 10/30/17 0045 10/30/17 0124 10/30/17 0505  BP:   110/80 (!) 156/51  Pulse: (!) 27 (!) 31 (!) 41 (!) 39  Resp: (!) _0 Temp:   97.6 F (36.4 C) 98.3 F (36.8 C)  TempSrc:   Oral Oral  SpO2: 94% 97% 92% 94%  Weight:   84.9 kg (187 lb 2.7 oz)   Height:   _1  (1.651 m)    Wt Readings from Last 10 Encounters:  10/30/17 84.9 kg (187 lb 2.7 oz)  06/28/17 79.7 kg (175 lb 9.6 oz)  12/29/16 76.7 kg (169 lb)    06/23/16 78 kg (172 lb)  04/15/16 77.1 kg (170 lb)  04/07/16 78.1 kg (172 lb 3.2 oz)  03/24/16 81.1 kg (178 lb 12.8 oz)  03/27/15 77.9 kg (171 lb 12.8 oz)  04/09/14 83.1 kg (183 lb 4 oz)  03/30/13 92.1 kg (203 lb)    PE:  General: No acute distress HEENT: Atraumatic, EOMI, mucous membranes moist. JVD at 45 degrees to jaw CV: Slow, irregular with harsh 3 out of 6 systolic ejection murmur holosystolic with diminished portion  of the second heart sound Respiratory: Bilateral lower extremity crackles right worse than left with diminished breath sounds in the right ABD: Non-distended and non-tender. No palpable organomegaly.  Extremities: 2+ radial pulses bilaterally. 2+ edema. Neuro/Psych: CN grossly intact, alert and oriented  Assessment/Plan Acute heart failure likely multifactorial with LE edema and pleural effusions History of moderate aortic stenosis History of chronic diastolic heart failure History of chronic atrial fibrillation; currently with slow ventricular response Hypertension Nonobstructive calcified CAD on last cath in 2017 HLD  Her presentation reflects an acute heart failure exacerbation.  Unclear at this time if this is worsening of her aortic stenosis or exacerbation of her underlying chronic diastolic heart failure in the setting of hypertension, salt intake etc.  Comorbid on this admission is slow ventricular response with her atrial fibrillation.  She takes 240 mg of diltiazem at home which should be held to see if her heart rate improves.  At this time no indication for temporary pacemaker.  Despite the low heart rate there appears to still be irregularity.  She will need an echocardiogram today to better assess underlying cause of heart failure and aortic stenosis progression.  I agree with IV Lasix with strict ins and outs monitored and daily weights with re-dosing as needed.  OK to continue warfarin for [facility] or switch to heparin if expecting invasive procedures  (draining pleural effusions).    Lolita Cram Marvetta Vohs  MD 10/30/2017, 6:54 AM

## 2017-10-30 NOTE — ED Notes (Signed)
Attempted report x 1; name and call back number provided 

## 2017-10-30 NOTE — H&P (Signed)
History and Physical    BERT GIVANS VEH:[id number] DOB: Nov 24, 1928 DOA: 10/29/2017  PCP: Caryl Bis, MD  Patient coming from: home via ems   Chief Complaint: shortness of breath  HPI: Sharon Little is a 84 y.o. female with medical history significant for diastolic chf, intraventricular conduction delay, chronic atrial fibrillation, chronic back pain on daily opioid, moderate aortic stenosis, presenting with the above.  Says symptoms have been building for a week or two. Has also noticed increased LE swelling for several days. Today her shortness of breath was significant and she called EMS. Denies chest pain, denies cough, denies vomiting, denies abdominal pain. Says has doubled up on lasix last few days but swelling and sob continued to worsen. Is unsure of dry weight - doesn't weigh self often - but thinks probably in the 170s. Compliant with medications. Is not on home O2.  Also complains of back pain. Chronic problem present for years, comes and goes, currently present. Says has been referred to pain mgmt but hasn't been scheduled yet. Denies new bowel/bladder dysfunction, denies saddle anesthesia, denies new gait abnormality or LE weakness/numbness.  ED Course: labs, imaging, cardiology consult, furosemide  Review of Systems: As per HPI otherwise 10 point review of systems negative.    Past Medical History:  Diagnosis Date  . Chronic atrial fibrillation ([place])   . Chronic back pain   . Hyperlipidemia   . Leg edema     Past Surgical History:  Procedure Laterality Date  . CARDIAC CATHETERIZATION N/A 04/06/2016   Procedure: Left Heart Cath and Coronary Angiography;  Surgeon: Jettie Booze, MD;  Location: [facility];  Service: Cardiovascular;  Laterality: N/A;     reports that she quit smoking about 49 years ago. Her smoking use included cigarettes. She has a 48.00 pack-year smoking history. She has never used smokeless tobacco. She reports that she does not  drink alcohol or use drugs.  Allergies  Allergen Reactions  . Penicillins Swelling and Rash    Has patient had a PCN reaction causing immediate rash, facial/tongue/throat swelling, SOB or lightheadedness with hypotension: Yes Has patient had a PCN reaction causing severe rash involving mucus membranes or skin necrosis: No Has patient had a PCN reaction that required hospitalization No Has patient had a PCN reaction occurring within the last 10 years: No If all of the above answers are "NO", then may proceed with Cephalosporin use.    Family History  Problem Relation Age of Onset  . Heart disease Mother   . Colon cancer Father   . Heart disease Sister   . Heart disease Brother     Prior to Admission medications   Medication Sig Start Date End Date Taking? Authorizing Provider  acetaminophen (TYLENOL) 650 MG CR tablet Take 650 mg by mouth every 8 (eight) hours as needed for pain.    Yes [provider]  B Complex Vitamins (B COMPLEX-B12) TABS Take 1 tablet by mouth daily.    Yes [provider]  Cholecalciferol (VITAMIN D3) 1000 UNITS tablet Take 1,000 Units by mouth daily.     Yes [provider]  diltiazem (CARDIZEM CD) 240 MG 24 hr capsule Take 240 mg by mouth daily.    Yes [provider]  DimenhyDRINATE (TRAVEL SICKNESS PO) Take 1 tablet by mouth at bedtime as needed (headache).    Yes [provider]  furosemide (LASIX) 40 MG tablet TAKE 1 AND 1/2 TABLETS BY MOUTH DAILY 09/06/17  Yes Domenic Polite,  Aloha Gell, MD  gabapentin (NEURONTIN) 600 MG tablet Take 600 mg by mouth 4 (four) times daily.    Yes [provider]  lovastatin (MEVACOR) 20 MG tablet Take 20 mg by mouth daily with supper.    Yes [provider]  Multiple Vitamin (MULTIVITAMIN WITH MINERALS) TABS tablet Take 1 tablet by mouth daily.   Yes [provider]  nitroGLYCERIN (NITROSTAT) 0.4 MG SL tablet Place 1 tablet (0.4 mg total) under the tongue every 5  (five) minutes x 3 doses as needed for chest pain. 04/07/16  Yes Cheryln Manly, NP  OVER THE COUNTER MEDICATION Place 1 drop into both eyes daily. Over the counter lubricating eye drop   Yes [provider]  ranitidine (ZANTAC) 150 MG tablet Take 150 mg by mouth 2 (two) times daily as needed for heartburn.  03/26/16  Yes [provider]  traMADol (ULTRAM) 50 MG tablet Take 50 mg by mouth every 4 (four) hours as needed (pain).    Yes [provider]  warfarin (COUMADIN) 4 MG tablet Take 1 tablet daily except 1 1/2 tablets on Wednesdays and Saturdays Patient taking differently: Take 4 mg by mouth daily at 6 PM. Take 6 mg on Wednesday  Take 4 mg all the other days 07/19/17  Yes Satira Sark, MD  aspirin EC 81 MG EC tablet Take 1 tablet (81 mg total) by mouth daily. Patient not taking: Reported on 10/29/2017 04/08/16   Cheryln Manly, NP    Physical Exam: Vitals:   10/29/17 1930 10/29/17 1945 10/29/17 2115 10/29/17 2300  BP: (!) 164/60 135/62 (!) 130/58 (!) 146/63  Pulse: (!) 32 (!) 34 (!) 31 (!) 30  Resp: (!) 26 17 17 16   Temp:      TempSrc:      SpO2: 93% 92% 94% 94%    Constitutional: No acute distress Head: Atraumatic Eyes: Conjunctiva clear ENM: Moist mucous membranes. Poor dentition.  Neck: Supple Respiratory: poor inspiratory effort. Decreased breath sounds at bases. Rales at bases. No wheeze Cardiovascular: distant heart sounds, irregularly irregular, systolic murmur moderate Abdomen: Non-tender, non-distended. No masses. No rebound or guarding. Positive bowel sounds. Musculoskeletal: No joint deformity upper and lower extremities. Normal ROM, no contractures. Normal muscle tone.  Skin: No rashes, lesions, or ulcers.  Extremities: moderate pitting edema to knees. Palpable peripheral pulses. Warm. Neurologic: Alert, moving all 4 extremities. Psychiatric: Normal insight and judgement.   Labs on Admission: I have personally reviewed  following labs and imaging studies  CBC: Recent Labs  Lab 10/29/17 1911 10/29/17 1922  WBC 6.8  --   HGB 11.7* 11.9*  HCT 36.1 35.0*  MCV 98.1  --   PLT 222  --    Basic Metabolic Panel: Recent Labs  Lab 10/29/17 1911 10/29/17 1922 10/29/17 2125  NA  --  144 141  K  --  3.5 3.6  CL  --  108 107  CO2  --   --  24  GLUCOSE  --  125* 121*  BUN  --  15 15  CREATININE  --  0.90 0.95  CALCIUM  --   --  8.9  MG 2.2  --   --    GFR: CrCl cannot be calculated (Unknown ideal weight.). Liver Function Tests: Recent Labs  Lab 10/29/17 2125  AST 20  ALT 14  ALKPHOS 61  BILITOT 0.5  PROT 6.6  ALBUMIN 3.9   No results for input(s): LIPASE, AMYLASE in the last 168  hours. No results for input(s): AMMONIA in the last 168 hours. Coagulation Profile: Recent Labs  Lab 10/29/17 1911  INR 4.02*   Cardiac Enzymes: No results for input(s): CKTOTAL, CKMB, CKMBINDEX, TROPONINI in the last 168 hours. BNP (last 3 results) No results for input(s): PROBNP in the last 8760 hours. HbA1C: No results for input(s): HGBA1C in the last 72 hours. CBG: No results for input(s): GLUCAP in the last 168 hours. Lipid Profile: No results for input(s): CHOL, HDL, LDLCALC, TRIG, CHOLHDL, LDLDIRECT in the last 72 hours. Thyroid Function Tests: No results for input(s): TSH, T4TOTAL, FREET4, T3FREE, THYROIDAB in the last 72 hours. Anemia Panel: No results for input(s): VITAMINB12, FOLATE, FERRITIN, TIBC, IRON, RETICCTPCT in the last 72 hours. Urine analysis: No results found for: COLORURINE, APPEARANCEUR, LABSPEC, PHURINE, GLUCOSEU, HGBUR, BILIRUBINUR, KETONESUR, PROTEINUR, UROBILINOGEN, NITRITE, LEUKOCYTESUR  Radiological Exams on Admission: Dg Chest Portable 1 View  Result Date: 10/29/2017 CLINICAL DATA:  Shortness of breath with bradycardia EXAM: PORTABLE CHEST 1 VIEW COMPARISON:  08/25/2016 FINDINGS: Cardiomegaly with vascular congestion and mild diffuse interstitial opacity consistent with  edema. Small left pleural effusion and probable small moderate right pleural effusion. Atelectasis or pneumonia at the right base. Aortic atherosclerosis. No pneumothorax. IMPRESSION: 1. Cardiomegaly with vascular congestion and mild interstitial edema 2. Right greater than left pleural effusions with atelectasis or pneumonia at the right base. Electronically Signed   By: Donavan Foil M.D.   On: 10/29/2017 20:01   Ct Angio Chest/abd/pel For Dissection W And/or Wo Contrast  Result Date: 10/29/2017 CLINICAL DATA:  84 year old female with shortness of breath, bradycardia, chest and back pain. EXAM: CT ANGIOGRAPHY CHEST, ABDOMEN AND PELVIS TECHNIQUE: Multidetector CT imaging through the chest, abdomen and pelvis was performed using the standard protocol during bolus administration of intravenous contrast. Multiplanar reconstructed images and MIPs were obtained and reviewed to evaluate the vascular anatomy. CONTRAST:  166mL ISOVUE-370 IOPAMIDOL (ISOVUE-370) INJECTION 76% COMPARISON:  CT chest abdomen and pelvis 08/19/2015. FINDINGS: CTA CHEST FINDINGS Cardiovascular: Cardiomegaly appears stable. Extensive calcified coronary artery atherosclerosis and/or stents. Calcified aortic atherosclerosis. Negative for thoracic aortic dissection or aneurysm. Incidental 4 vessel arch configuration (normal variant). Patent proximal great vessels. There is also adequate contrast in the central pulmonary arteries which appear patent. Mediastinum/Nodes: Mildly increased mediastinal lymph nodes which appear reactive. Lungs/Pleura: Atelectatic changes to the major airways but also suspicion of aspirated or retained secretions in the trachea above the carina on series 8, image 44. Otherwise the major airways are patent. Moderate to large layering right pleural effusion with simple fluid density. Small layering left pleural effusion. Right greater than left lung compressive atelectasis. Respiratory motion artifact. Mild pulmonary septal  thickening and perihilar ground-glass opacity. No bona fide consolidation identified. Musculoskeletal: Osteopenia. No acute osseous abnormality identified. Review of the MIP images confirms the above findings. CTA ABDOMEN AND PELVIS FINDINGS VASCULAR Aortoiliac calcified atherosclerosis. Negative for abdominal aortic dissection or aneurysm. The major arterial structures in the abdomen and pelvis remain patent and appear stable to the 2018 CTA. The portal venous system is not yet opacified on these images. Review of the MIP images confirms the above findings. NON-VASCULAR Hepatobiliary: The gallbladder appears partially contracted and yet indistinct with surrounding pericholecystic stranding (series 7, image 173). This is not seem related to motion artifact. Scattered small hypodense areas in the liver are stable to decreased since 2018. No biliary ductal dilatation is evident. Pancreas: Negative. Spleen: Negative. Adrenals/Urinary Tract: Normal adrenal glands. Bilateral renal enhancement is symmetric and within normal limits.  The kidneys appear stable. No hydronephrosis or hydroureter. The urinary bladder is distended with an estimated bladder volume of 634 milliliters, but otherwise appears normal. Stomach/Bowel: Moderate diverticulosis and redundancy of the sigmoid colon without active inflammation. Similar retained stool throughout the colon to the 2018 comparison. Redundant colonic flexures. Intermittent diverticulosis in the proximal large bowel. No acute large bowel inflammation. No dilated small bowel loops. There is flocculated material in the distal small bowel. The stomach is within normal limits. The duodenum appears normal aside from a chronic duodenum diverticulum measuring 3.5 centimeters on series 7, image 192. No abdominal free air.  No free fluid. Lymphatic: No lymphadenopathy. Reproductive: Surgically absent. Other: No pelvic free fluid. Musculoskeletal: Stable visualized osseous structures.  Intermittent advanced lumbar spine degeneration. Osteopenia. Review of the MIP images confirms the above findings. IMPRESSION: 1. Positive for Aortic Atherosclerosis (ICD10-I70.0) but negative for aortic dissection or aneurysm. The central pulmonary arteries appear patent. 2. Apparent gallbladder inflammation, although the gallbladder appears partially contracted. Recommend follow-up Right Upper Quadrant Ultrasound to evaluate for possible acute cholecystitis. 3. Retained or aspirated secretions in the trachea, but no pneumonia at this time. 4. Moderate to large layering right and small layering left pleural effusions with compressive atelectasis. Consider superimposed acute pulmonary interstitial edema. 5. Chronic cardiomegaly and coronary artery atherosclerosis. 6. Distended urinary bladder, 634 mL. Electronically Signed   By: Genevie Ann M.D.   On: 10/29/2017 20:41   US Abdomen Limited Ruq  Result Date: 10/29/2017 CLINICAL DATA:  84 year old female with appearance of gallbladder inflammation on CTA chest abdomen and pelvis earlier today performed for chest and back pain. EXAM: ULTRASOUND ABDOMEN LIMITED RIGHT UPPER QUADRANT COMPARISON:  CTA chest abdomen and pelvis 2011 hours on 10/29/2017. FINDINGS: Gallbladder: The gallbladder lumen is free of echogenic sludge or stones. There is mild gallbladder wall thickening measuring 4 millimeters (image 8) with suggestion of trace pericholecystic fluid (image 14). However, no sonographic Murphy sign was elicited. Common bile duct: Diameter: 4 millimeters, normal. Liver: Several simple, benign cysts are noted including in the left lobe measuring 16 millimeters (image 37), and the central liver measuring 15 millimeters (image 41). There is a slightly more complex but benign appearing cyst also in the inferior right lobe on image 52 measuring 14 millimeters. No intrahepatic biliary ductal dilatation. Portal vein is patent on color Doppler imaging with normal direction of  blood flow towards the liver. Other findings: There is a trace amount of free fluid adjacent to the liver on image 66. Negative visible right kidney. IMPRESSION: 1. Positive for mild gallbladder wall thickening and trace pericholecystic fluid, but negative for cholelithiasis or sonographic Murphy sign. Superimposed trace amount of nonspecific perihepatic free fluid. This constellation favors reactive gallbladder wall thickening rather than acute acalculus cholecystitis. 2. Several benign hepatic cysts are incidentally noted. Electronically Signed   By: Genevie Ann M.D.   On: 10/29/2017 23:11    EKG: Independently reviewed. A-fib. Bradycardic. PVCs. AV conduction delay. No st elevation/depression  Assessment/Plan Active Problems:   Edema   IVCD (intraventricular conduction defect)   Warfarin anticoagulation   Back pain   Chronic atrial fibrillation ([facility])   Acute on chronic diastolic CHF (congestive heart failure) ([facility])   Moderate aortic valve stenosis   Hypoxia   # Acute heart failure exacerbation # Hypoxic respiratory failure # Aortic stenosis - last tte 2017 showed preserved EF. Here w/ dyspnea, new O2 requirement, elevated BNP to 300s, CXR showing vascular congestion, and with significant LE edema. Hemodynamically stable. Has  received furosemide 40 mg IV in ED (home dose is 60 mg po daily). CTA w/o sign PE. Does not complain of chest pain and initial troponins negative - TTE ordered - daily weights - monitor I/O (Foley is in place) - furosemide 60 mg IV this morning - trend troponin - tele overnight,  - ekg AM  # Intraventricular conduction delay # Bradycardia - hemodynamically stable, BPs actually a bit elevated. ED consulted cards, advised treating underlying problem (chf) - chf as above - appreciate cardiology recs - hold diltiazem for now (card rec as well)  # Atrial fibrillation - on coumadin. INR supratherapeutic at 4. No bleeding - hold coumadin; resume when INR therapeutic -  daily INR  # Gallbladder wall thickening - Seen on CT; ruq u/s also shows but w/o strong signs cholecystitis. Asymptomatic, normal labs - think incidental finding - continue to monitor  # HTN - hold diltiazem as above - diuresis as above  # Chronic pain - continue home tramadol; add dilaudid prn - continue home gabapentin   DVT prophylaxis: lovenox, scds Code Status: dnr, confirmed w/ pt and family   Family Communication: daughter Stanton Kidney sue bennett [phone]  Disposition Plan: tbd  Consults called: cardiology Dr. Lamona Curl  Admission status: med/surg tele    Desma Maxim MD [facility] Pager [phone]  If 7PM-7AM, please contact night-coverage [email or web address] Password [email or web address]  10/30/2017, 12:13 AM

## 2017-10-30 NOTE — Progress Notes (Signed)
84 year old female with a history of diastolic heart failure, chronic atrial fibrillation, moderate aortic stenosis admitted with shortness of breath.  Patient is found to have acute CHF exacerbation, and bradycardia.  Treated with Lasix.  I will continue Lasix.  Patient was on Cardizem at home which has been stopped at this time.  Due to significant bradycardia.  Cardiology consult following.

## 2017-10-31 ENCOUNTER — Inpatient Hospital Stay (HOSPITAL_COMMUNITY): Payer: Medicare Other

## 2017-10-31 DIAGNOSIS — I5033 Acute on chronic diastolic (congestive) heart failure: Secondary | ICD-10-CM

## 2017-10-31 DIAGNOSIS — I10 Essential (primary) hypertension: Secondary | ICD-10-CM

## 2017-10-31 DIAGNOSIS — R001 Bradycardia, unspecified: Secondary | ICD-10-CM

## 2017-10-31 DIAGNOSIS — I482 Chronic atrial fibrillation: Secondary | ICD-10-CM

## 2017-10-31 DIAGNOSIS — I350 Nonrheumatic aortic (valve) stenosis: Secondary | ICD-10-CM

## 2017-10-31 DIAGNOSIS — M549 Dorsalgia, unspecified: Secondary | ICD-10-CM

## 2017-10-31 DIAGNOSIS — I361 Nonrheumatic tricuspid (valve) insufficiency: Secondary | ICD-10-CM

## 2017-10-31 DIAGNOSIS — I25118 Atherosclerotic heart disease of native coronary artery with other forms of angina pectoris: Secondary | ICD-10-CM

## 2017-10-31 LAB — BASIC METABOLIC PANEL
Anion gap: 8 (ref 5–15)
BUN: 14 mg/dL (ref 6–20)
CHLORIDE: 105 mmol/L (ref 101–111)
CO2: 28 mmol/L (ref 22–32)
Calcium: 9.2 mg/dL (ref 8.9–10.3)
Creatinine, Ser: 1.03 mg/dL — ABNORMAL HIGH (ref 0.44–1.00)
GFR calc Af Amer: 55 mL/min — ABNORMAL LOW (ref >60)
GFR calc non Af Amer: 47 mL/min — ABNORMAL LOW (ref >60)
GLUCOSE: 101 mg/dL — AB (ref 65–99)
POTASSIUM: 4 mmol/L (ref 3.5–5.1)
Sodium: 141 mmol/L (ref 135–145)

## 2017-10-31 LAB — ECHOCARDIOGRAM COMPLETE
Height: 65 in
Weight: 2886.4 oz

## 2017-10-31 LAB — PROTIME-INR
INR: 3.42
Prothrombin Time: 34.3 seconds — ABNORMAL HIGH (ref 11.4–15.2)

## 2017-10-31 LAB — MAGNESIUM: Magnesium: 2.2 mg/dL (ref 1.7–2.4)

## 2017-10-31 MED ORDER — ONDANSETRON HCL 4 MG PO TABS
4.0000 mg | ORAL_TABLET | Freq: Three times a day (TID) | ORAL | Status: DC | PRN
  Administered 2017-11-01: 4 mg via ORAL
  Filled 2017-10-31: qty 1

## 2017-10-31 MED ORDER — LOSARTAN POTASSIUM 25 MG PO TABS
25.0000 mg | ORAL_TABLET | Freq: Every day | ORAL | Status: DC
  Administered 2017-10-31 – 2017-11-03 (×4): 25 mg via ORAL
  Filled 2017-10-31 (×4): qty 1

## 2017-10-31 MED ORDER — HYDRALAZINE HCL 20 MG/ML IJ SOLN: 5.0000 mg | Freq: Four times a day (QID) | INTRAMUSCULAR | Status: DC | PRN

## 2017-10-31 NOTE — Progress Notes (Signed)
Progress Note  Patient Name: Sharon Little Date of Encounter: 10/31/2017  Primary Cardiologist: Dr. Domenic Polite  Subjective   Denies chest pain. SOB has improved. Has some chronic back pain.  Inpatient Medications    Scheduled Meds: . albuterol  2.5 mg Nebulization Q6H  . furosemide  40 mg Intravenous Q12H  . gabapentin  600 mg Oral QID  . potassium chloride  20 mEq Oral BID  . sodium chloride flush  3 mL Intravenous Q12H   Continuous Infusions: . sodium chloride     PRN Meds: sodium chloride, HYDROmorphone (DILAUDID) injection, sodium chloride flush, traMADol   Vital Signs    Vitals:   10/30/17 2052 10/31/17 0333 10/31/17 0816 10/31/17 0922  BP: (!) 157/55 (!) 171/71 (!) 188/78   Pulse: 90 (!) 157 (!) 50   Resp: 13 13 (!) 25   Temp: 97.6 F (36.4 C) 98.7 F (37.1 C) 98.1 F (36.7 C)   TempSrc: Oral Oral Oral   SpO2: 94% 94% 93% 90%  Weight:  180 lb 6.4 oz (81.8 kg)    Height:        Intake/Output Summary (Last 24 hours) at 10/31/2017 1033 Last data filed at 10/31/2017 0817 Gross per 24 hour  Intake -  Output 250 ml  Net -250 ml   Filed Weights   10/30/17 0124 10/31/17 0333  Weight: 187 lb 2.7 oz (84.9 kg) 180 lb 6.4 oz (81.8 kg)    Telemetry    A fib, frequent PVC's, NSVT - Personally Reviewed  ECG    n/a - Personally Reviewed  Physical Exam   GEN: No acute distress.   Neck: No JVD Cardiac: Irregular, 3/6 ejection systolic murmur loudest at RUSB,no rubs, or gallops.  Respiratory: Diminished sounds on lower 1/3 on right GI: Soft, nontender, non-distended  MS: No edema; No deformity. Neuro:  Nonfocal  Psych: Normal affect   Labs    Chemistry Recent Labs  Lab 10/29/17 2125 10/30/17 0333 10/31/17 0258  NA 141 142 141  K 3.6 3.9 4.0  CL 107 108 105  CO2 24 23 28   GLUCOSE 121* 122* 101*  BUN 15 14 14   CREATININE 0.95 0.89  0.89 1.03*  CALCIUM 8.9 9.4 9.2  PROT 6.6  --   --   ALBUMIN 3.9  --   --   AST 20  --   --   ALT 14  --   --     ALKPHOS 61  --   --   BILITOT 0.5  --   --   GFRNONAA 52* 57*  57* 47*  GFRAA >60 >60  >60 55*  ANIONGAP 10 11 8      Hematology Recent Labs  Lab 10/29/17 1911 10/29/17 1922 10/30/17 0333  WBC 6.8  --  9.0  RBC 3.68*  --  3.87  HGB 11.7* 11.9* 12.4  HCT 36.1 35.0* 38.3  MCV 98.1  --  99.0  MCH 31.8  --  32.0  MCHC 32.4  --  32.4  RDW 14.4  --  14.7  PLT 222  --  198    Cardiac Enzymes Recent Labs  Lab 10/30/17 0333 10/30/17 0955  TROPONINI 0.07* 0.06*    Recent Labs  Lab 10/29/17 1919 10/29/17 2300  TROPIPOC 0.03 0.03     BNP Recent Labs  Lab 10/29/17 1902  BNP 337.1*     DDimer No results for input(s): DDIMER in the last 168 hours.   Radiology    Dg Chest  Portable 1 View  Result Date: 10/29/2017 CLINICAL DATA:  Shortness of breath with bradycardia EXAM: PORTABLE CHEST 1 VIEW COMPARISON:  08/25/2016 FINDINGS: Cardiomegaly with vascular congestion and mild diffuse interstitial opacity consistent with edema. Small left pleural effusion and probable small moderate right pleural effusion. Atelectasis or pneumonia at the right base. Aortic atherosclerosis. No pneumothorax. IMPRESSION: 1. Cardiomegaly with vascular congestion and mild interstitial edema 2. Right greater than left pleural effusions with atelectasis or pneumonia at the right base. Electronically Signed   By: Donavan Foil M.D.   On: 10/29/2017 20:01   Ct Angio Chest/abd/pel For Dissection W And/or Wo Contrast  Result Date: 10/29/2017 CLINICAL DATA:  84 year old female with shortness of breath, bradycardia, chest and back pain. EXAM: CT ANGIOGRAPHY CHEST, ABDOMEN AND PELVIS TECHNIQUE: Multidetector CT imaging through the chest, abdomen and pelvis was performed using the standard protocol during bolus administration of intravenous contrast. Multiplanar reconstructed images and MIPs were obtained and reviewed to evaluate the vascular anatomy. CONTRAST:  149mL ISOVUE-370 IOPAMIDOL (ISOVUE-370) INJECTION  76% COMPARISON:  CT chest abdomen and pelvis 08/19/2015. FINDINGS: CTA CHEST FINDINGS Cardiovascular: Cardiomegaly appears stable. Extensive calcified coronary artery atherosclerosis and/or stents. Calcified aortic atherosclerosis. Negative for thoracic aortic dissection or aneurysm. Incidental 4 vessel arch configuration (normal variant). Patent proximal great vessels. There is also adequate contrast in the central pulmonary arteries which appear patent. Mediastinum/Nodes: Mildly increased mediastinal lymph nodes which appear reactive. Lungs/Pleura: Atelectatic changes to the major airways but also suspicion of aspirated or retained secretions in the trachea above the carina on series 8, image 44. Otherwise the major airways are patent. Moderate to large layering right pleural effusion with simple fluid density. Small layering left pleural effusion. Right greater than left lung compressive atelectasis. Respiratory motion artifact. Mild pulmonary septal thickening and perihilar ground-glass opacity. No bona fide consolidation identified. Musculoskeletal: Osteopenia. No acute osseous abnormality identified. Review of the MIP images confirms the above findings. CTA ABDOMEN AND PELVIS FINDINGS VASCULAR Aortoiliac calcified atherosclerosis. Negative for abdominal aortic dissection or aneurysm. The major arterial structures in the abdomen and pelvis remain patent and appear stable to the 2018 CTA. The portal venous system is not yet opacified on these images. Review of the MIP images confirms the above findings. NON-VASCULAR Hepatobiliary: The gallbladder appears partially contracted and yet indistinct with surrounding pericholecystic stranding (series 7, image 173). This is not seem related to motion artifact. Scattered small hypodense areas in the liver are stable to decreased since 2018. No biliary ductal dilatation is evident. Pancreas: Negative. Spleen: Negative. Adrenals/Urinary Tract: Normal adrenal glands.  Bilateral renal enhancement is symmetric and within normal limits. The kidneys appear stable. No hydronephrosis or hydroureter. The urinary bladder is distended with an estimated bladder volume of 634 milliliters, but otherwise appears normal. Stomach/Bowel: Moderate diverticulosis and redundancy of the sigmoid colon without active inflammation. Similar retained stool throughout the colon to the 2018 comparison. Redundant colonic flexures. Intermittent diverticulosis in the proximal large bowel. No acute large bowel inflammation. No dilated small bowel loops. There is flocculated material in the distal small bowel. The stomach is within normal limits. The duodenum appears normal aside from a chronic duodenum diverticulum measuring 3.5 centimeters on series 7, image 192. No abdominal free air.  No free fluid. Lymphatic: No lymphadenopathy. Reproductive: Surgically absent. Other: No pelvic free fluid. Musculoskeletal: Stable visualized osseous structures. Intermittent advanced lumbar spine degeneration. Osteopenia. Review of the MIP images confirms the above findings. IMPRESSION: 1. Positive for Aortic Atherosclerosis (ICD10-I70.0) but negative for aortic dissection  or aneurysm. The central pulmonary arteries appear patent. 2. Apparent gallbladder inflammation, although the gallbladder appears partially contracted. Recommend follow-up Right Upper Quadrant Ultrasound to evaluate for possible acute cholecystitis. 3. Retained or aspirated secretions in the trachea, but no pneumonia at this time. 4. Moderate to large layering right and small layering left pleural effusions with compressive atelectasis. Consider superimposed acute pulmonary interstitial edema. 5. Chronic cardiomegaly and coronary artery atherosclerosis. 6. Distended urinary bladder, 634 mL. Electronically Signed   By: Genevie Ann M.D.   On: 10/29/2017 20:41   US Abdomen Limited Ruq  Result Date: 10/29/2017 CLINICAL DATA:  84 year old female with appearance  of gallbladder inflammation on CTA chest abdomen and pelvis earlier today performed for chest and back pain. EXAM: ULTRASOUND ABDOMEN LIMITED RIGHT UPPER QUADRANT COMPARISON:  CTA chest abdomen and pelvis 2011 hours on 10/29/2017. FINDINGS: Gallbladder: The gallbladder lumen is free of echogenic sludge or stones. There is mild gallbladder wall thickening measuring 4 millimeters (image 8) with suggestion of trace pericholecystic fluid (image 14). However, no sonographic Murphy sign was elicited. Common bile duct: Diameter: 4 millimeters, normal. Liver: Several simple, benign cysts are noted including in the left lobe measuring 16 millimeters (image 37), and the central liver measuring 15 millimeters (image 41). There is a slightly more complex but benign appearing cyst also in the inferior right lobe on image 52 measuring 14 millimeters. No intrahepatic biliary ductal dilatation. Portal vein is patent on color Doppler imaging with normal direction of blood flow towards the liver. Other findings: There is a trace amount of free fluid adjacent to the liver on image 66. Negative visible right kidney. IMPRESSION: 1. Positive for mild gallbladder wall thickening and trace pericholecystic fluid, but negative for cholelithiasis or sonographic Murphy sign. Superimposed trace amount of nonspecific perihepatic free fluid. This constellation favors reactive gallbladder wall thickening rather than acute acalculus cholecystitis. 2. Several benign hepatic cysts are incidentally noted. Electronically Signed   By: Genevie Ann M.D.   On: 10/29/2017 23:11    Cardiac Studies   Echocardiogram pending  Patient Profile     84 y.o. female with history of moderate aortic stenosis, diastolic congestive heart failure, permanent atrial fibrillation on Coumadin and diltiazem for rate control presenting with worsening shortness of breath as her primary complaint.  Assessment & Plan    1. Acute diastolic heart failure: Symptomatically  improving. Echocardiogram pending. Over 2 L output in last 24 hrs on IV Lasix 40 mg bid which I would continue.  2. Aortic stenosis: Last described as mild to moderate in October 2017. Echo pending this admission.  3. CAD: Moderate but nonobstructive in 03/2016. No chest pain. Troponins peaked at 0.07.  4. Permanent atrial fibrillation/bradycardia: Diltiazem has been held. HR normal. Frequent ventricular ectopy. INR supratherapeutic (3.42 today) so warfarin on hold. Will consult pharmacy for timing of initiation.  5. Hypertension: BP is markedly elevated. Diltiazem on hold due to bradycardia at time of admission. Renal function is normal. I will start losartan 25 mg daily.     For questions or updates, please contact [facility] Please consult [email or web address] for contact info under Cardiology/STEMI.      Signed, Kate Sable, MD  10/31/2017, 10:33 AM

## 2017-10-31 NOTE — Progress Notes (Signed)
ANTICOAGULATION CONSULT NOTE - Initial Consult  Pharmacy Consult for coumadin Indication: atrial fibrillation  Allergies  Allergen Reactions  . Penicillins Swelling and Rash    Has patient had a PCN reaction causing immediate rash, facial/tongue/throat swelling, SOB or lightheadedness with hypotension: Yes Has patient had a PCN reaction causing severe rash involving mucus membranes or skin necrosis: No Has patient had a PCN reaction that required hospitalization No Has patient had a PCN reaction occurring within the last 10 years: No If all of the above answers are "NO", then may proceed with Cephalosporin use.    Patient Measurements: Height: 5\' 5"  (165.1 cm) Weight: 180 lb 6.4 oz (81.8 kg) IBW/kg (Calculated) : 57   Vital Signs: Temp: 98.1 F (36.7 C) (05/05 0816) Temp Source: Oral (05/05 0816) BP: 188/78 (05/05 0816) Pulse Rate: 50 (05/05 0816)  Labs: Recent Labs    10/29/17 1911  10/29/17 1922 10/29/17 2125 10/30/17 0333 10/30/17 0955 10/31/17 0258  HGB 11.7*  --  11.9*  --  12.4  --   --   HCT 36.1  --  35.0*  --  38.3  --   --   PLT 222  --   --   --  198  --   --   LABPROT 38.9*  --   --   --  36.9*  --  34.3*  INR 4.02*  --   --   --  3.77  --  3.42  CREATININE  --    < > 0.90 0.95 0.89  0.89  --  1.03*  TROPONINI  --   --   --   --  0.07* 0.06*  --    < > = values in this interval not displayed.    Estimated Creatinine Clearance: 40.6 mL/min (A) (by C-G formula based on SCr of 1.03 mg/dL (H)).   Medical History: Past Medical History:  Diagnosis Date  . Chronic atrial fibrillation ([place])   . Chronic back pain   . Hyperlipidemia   . Leg edema     Medications:  Medications Prior to Admission  Medication Sig Dispense Refill Last Dose  . acetaminophen (TYLENOL) 650 MG CR tablet Take 650 mg by mouth every 8 (eight) hours as needed for pain.    10/29/2017 at prn  . B Complex Vitamins (B COMPLEX-B12) TABS Take 1 tablet by mouth daily.    10/29/2017 at  Unknown time  . Cholecalciferol (VITAMIN D3) 1000 UNITS tablet Take 1,000 Units by mouth daily.     10/29/2017 at Unknown time  . diltiazem (CARDIZEM CD) 240 MG 24 hr capsule Take 240 mg by mouth daily.    10/29/2017 at Unknown time  . DimenhyDRINATE (TRAVEL SICKNESS PO) Take 1 tablet by mouth at bedtime as needed (headache).    unknown at prn  . furosemide (LASIX) 40 MG tablet TAKE 1 AND 1/2 TABLETS BY MOUTH DAILY 45 tablet 3 10/29/2017 at Unknown time  . gabapentin (NEURONTIN) 600 MG tablet Take 600 mg by mouth 4 (four) times daily.    10/29/2017 at prn  . lovastatin (MEVACOR) 20 MG tablet Take 20 mg by mouth daily with supper.    10/29/2017 at Unknown time  . Multiple Vitamin (MULTIVITAMIN WITH MINERALS) TABS tablet Take 1 tablet by mouth daily.   10/29/2017 at Unknown time  . nitroGLYCERIN (NITROSTAT) 0.4 MG SL tablet Place 1 tablet (0.4 mg total) under the tongue every 5 (five) minutes x 3 doses as needed for chest pain. 25 tablet  3 unknown at prn  . OVER THE COUNTER MEDICATION Place 1 drop into both eyes daily. Over the counter lubricating eye drop   10/29/2017 at Unknown time  . ranitidine (ZANTAC) 150 MG tablet Take 150 mg by mouth 2 (two) times daily as needed for heartburn.   1 10/29/2017 at prn  . traMADol (ULTRAM) 50 MG tablet Take 50 mg by mouth every 4 (four) hours as needed (pain).    10/29/2017 at prn  . warfarin (COUMADIN) 4 MG tablet Take 1 tablet daily except 1 1/2 tablets on Wednesdays and Saturdays (Patient taking differently: Take 4 mg by mouth daily at 6 PM. Take 6 mg on Wednesday  Take 4 mg all the other days) 45 tablet 6 10/29/2017 at Unknown time  . aspirin EC 81 MG EC tablet Take 1 tablet (81 mg total) by mouth daily. (Patient not taking: Reported on 10/29/2017)   Not Taking at Unknown time   Scheduled:  . albuterol  2.5 mg Nebulization Q6H  . furosemide  40 mg Intravenous Q12H  . gabapentin  600 mg Oral QID  . losartan  25 mg Oral Daily  . potassium chloride  20 mEq Oral BID  . sodium  chloride flush  3 mL Intravenous Q12H    Assessment: 84 yo female with HF on coumadin PTA for afib. Pharmacy consulted to dose. -INR= 3.42 with trend down (likely elevated in setting of HF)  Home dose: 4mg /day except 6mg  on Wed  Goal of Therapy:  INR 2-3 Monitor platelets by anticoagulation protocol: Yes   Plan:  -Hold coumadin today -Daily PT/INR  Hildred Laser, PharmD Clinical Pharmacist Clinical phone from 8:30-4:00 is [phone] After 4pm, please call [facility] [phone]) for assistance. 10/31/2017 11:34 AM

## 2017-10-31 NOTE — Progress Notes (Signed)
PROGRESS NOTE    Sharon Little  UYQ:[id number] DOB: 1929/04/23 DOA: 10/29/2017 PCP: Sharon Bis, MD   Brief Sharon Little y.o. female with medical history significant for diastolic chf, intraventricular conduction delay, chronic atrial fibrillation, chronic back pain on daily opioid, moderate aortic stenosis, presenting with the above.  Says symptoms have been building for a week or two. Has also noticed increased LE swelling for several days. Today her shortness of breath was significant and she called EMS. Denies chest pain, denies cough, denies vomiting, denies abdominal pain. Says has doubled up on lasix last few days but swelling and sob continued to worsen. Is unsure of dry weight - doesn't weigh self often - but thinks probably in the 170s. Compliant with medications. Is not on home O2.  Also complains of back pain. Chronic problem present for years, comes and goes, currently present. Says has been referred to pain mgmt but hasn't been scheduled yet. Denies new bowel/bladder dysfunction, denies saddle anesthesia, denies new gait abnormality or LE weakness/numbness.    Assessment & Plan:   Active Problems:   Edema   IVCD (intraventricular conduction defect)   Warfarin anticoagulation   Back pain   Chronic atrial fibrillation (HCC)   Acute on chronic diastolic CHF (congestive heart failure) (HCC)   Moderate aortic valve stenosis   Hypoxia   Acute exacerbation of CHF (congestive heart failure) (HCC)   Acute on chronic heart failure (HCC)  1]Acute on chronic CHF -ECHO orderd.  Continue IV Lasix.  Follow-up chest x-ray today.  Echo to evaluate aortic valve.  Continue to hold Cardizem  due to bradycardia though improved.  2] chronic atrial fibrillation with bradycardia on Coumadin supratherapeutic pharmacy following.  3]htn-elevated noted losartan started by cardiology.  Continue to hold Cardizem.  Will add as needed hydralazine.  4] chronic back pain continue tramadol as  needed Dilaudid.   DVT prophylaxis: Coumadin Code Status: DO NOT RESUSCITATE Family Communication: No family available Disposition Plan: TBD will obtain PT evaluation patient comes from home lives at home with family   Consultants: Cardiology Procedures: None Antimicrobials: None  Subjective: Feels better than yesterday   Objective: Vitals:   10/30/17 2052 10/31/17 0333 10/31/17 0816 10/31/17 0922  BP: (!) 157/55 (!) 171/71 (!) 188/78   Pulse: 90 (!) 157 (!) 50   Resp: 13 13 (!) 25   Temp: 97.6 F (36.4 C) 98.7 F (37.1 C) 98.1 F (36.7 C)   TempSrc: Oral Oral Oral   SpO2: 94% 94% 93% 90%  Weight:  81.8 kg (180 lb 6.4 oz)    Height:        Intake/Output Summary (Last 24 hours) at 10/31/2017 1034 Last data filed at 10/31/2017 0817 Gross per 24 hour  Intake -  Output 250 ml  Net -250 ml   Filed Weights   10/30/17 0124 10/31/17 0333  Weight: 84.9 kg (187 lb 2.7 oz) 81.8 kg (180 lb 6.4 oz)    Examination:  General exam: Appears calm and comfortable  Respiratory system: Crackles to auscultation. Respiratory effort normal. Cardiovascular system: S1 & S2 heard, RRR. No JVD, murmurs, rubs, gallops or clicks. No pedal edema. Gastrointestinal system: Abdomen is nondistended, soft and nontender. No organomegaly or masses felt. Normal bowel sounds heard. Central nervous system: Alert and oriented. No focal neurological deficits. Extremities: Symmetric 5 x 5 power. Skin: No rashes, lesions or ulcers Psychiatry: Judgement and insight appear normal. Mood & affect appropriate.     Data Reviewed: I have personally reviewed following  labs and imaging studies  CBC: Recent Labs  Lab 10/29/17 1911 10/29/17 1922 10/30/17 0333  WBC 6.8  --  9.0  HGB 11.7* 11.9* 12.4  HCT 36.1 35.0* 38.3  MCV 98.1  --  99.0  PLT 222  --  627   Basic Metabolic Panel: Recent Labs  Lab 10/29/17 1911 10/29/17 1922 10/29/17 2125 10/30/17 0333 10/31/17 0258  NA  --  144 141 142 141  K   --  3.5 3.6 3.9 4.0  CL  --  108 107 108 105  CO2  --   --  24 23 28   GLUCOSE  --  125* 121* 122* 101*  BUN  --  15 15 14 14   CREATININE  --  0.90 0.95 0.89  0.89 1.03*  CALCIUM  --   --  8.9 9.4 9.2  MG 2.2  --   --   --  2.2   GFR: Estimated Creatinine Clearance: 40.6 mL/min (A) (by C-G formula based on SCr of 1.03 mg/dL (H)). Liver Function Tests: Recent Labs  Lab 10/29/17 2125  AST 20  ALT 14  ALKPHOS 61  BILITOT 0.5  PROT 6.6  ALBUMIN 3.9   No results for input(s): LIPASE, AMYLASE in the last 168 hours. No results for input(s): AMMONIA in the last 168 hours. Coagulation Profile: Recent Labs  Lab 10/29/17 1911 10/30/17 0333 10/31/17 0258  INR 4.02* 3.77 3.42   Cardiac Enzymes: Recent Labs  Lab 10/30/17 0333 10/30/17 0955  TROPONINI 0.07* 0.06*   BNP (last 3 results) No results for input(s): PROBNP in the last 8760 hours. HbA1C: No results for input(s): HGBA1C in the last 72 hours. CBG: No results for input(s): GLUCAP in the last 168 hours. Lipid Profile: No results for input(s): CHOL, HDL, LDLCALC, TRIG, CHOLHDL, LDLDIRECT in the last 72 hours. Thyroid Function Tests: No results for input(s): TSH, T4TOTAL, FREET4, T3FREE, THYROIDAB in the last 72 hours. Anemia Panel: No results for input(s): VITAMINB12, FOLATE, FERRITIN, TIBC, IRON, RETICCTPCT in the last 72 hours. Sepsis Labs: No results for input(s): PROCALCITON, LATICACIDVEN in the last 168 hours.  No results found for this or any previous visit (from the past 240 hour(s)).       Radiology Studies: Dg Chest Portable 1 View  Result Date: 10/29/2017 CLINICAL DATA:  Shortness of breath with bradycardia EXAM: PORTABLE CHEST 1 VIEW COMPARISON:  08/25/2016 FINDINGS: Cardiomegaly with vascular congestion and mild diffuse interstitial opacity consistent with edema. Small left pleural effusion and probable small moderate right pleural effusion. Atelectasis or pneumonia at the right base. Aortic  atherosclerosis. No pneumothorax. IMPRESSION: 1. Cardiomegaly with vascular congestion and mild interstitial edema 2. Right greater than left pleural effusions with atelectasis or pneumonia at the right base. Electronically Signed   By: Donavan Foil M.D.   On: 10/29/2017 20:01   Ct Angio Chest/abd/pel For Dissection W And/or Wo Contrast  Result Date: 10/29/2017 CLINICAL DATA:  84 year old female with shortness of breath, bradycardia, chest and back pain. EXAM: CT ANGIOGRAPHY CHEST, ABDOMEN AND PELVIS TECHNIQUE: Multidetector CT imaging through the chest, abdomen and pelvis was performed using the standard protocol during bolus administration of intravenous contrast. Multiplanar reconstructed images and MIPs were obtained and reviewed to evaluate the vascular anatomy. CONTRAST:  144mL ISOVUE-370 IOPAMIDOL (ISOVUE-370) INJECTION 76% COMPARISON:  CT chest abdomen and pelvis 08/19/2015. FINDINGS: CTA CHEST FINDINGS Cardiovascular: Cardiomegaly appears stable. Extensive calcified coronary artery atherosclerosis and/or stents. Calcified aortic atherosclerosis. Negative for thoracic aortic dissection or aneurysm. Incidental 4  vessel arch configuration (normal variant). Patent proximal great vessels. There is also adequate contrast in the central pulmonary arteries which appear patent. Mediastinum/Nodes: Mildly increased mediastinal lymph nodes which appear reactive. Lungs/Pleura: Atelectatic changes to the major airways but also suspicion of aspirated or retained secretions in the trachea above the carina on series 8, image 44. Otherwise the major airways are patent. Moderate to large layering right pleural effusion with simple fluid density. Small layering left pleural effusion. Right greater than left lung compressive atelectasis. Respiratory motion artifact. Mild pulmonary septal thickening and perihilar ground-glass opacity. No bona fide consolidation identified. Musculoskeletal: Osteopenia. No acute osseous  abnormality identified. Review of the MIP images confirms the above findings. CTA ABDOMEN AND PELVIS FINDINGS VASCULAR Aortoiliac calcified atherosclerosis. Negative for abdominal aortic dissection or aneurysm. The major arterial structures in the abdomen and pelvis remain patent and appear stable to the 2018 CTA. The portal venous system is not yet opacified on these images. Review of the MIP images confirms the above findings. NON-VASCULAR Hepatobiliary: The gallbladder appears partially contracted and yet indistinct with surrounding pericholecystic stranding (series 7, image 173). This is not seem related to motion artifact. Scattered small hypodense areas in the liver are stable to decreased since 2018. No biliary ductal dilatation is evident. Pancreas: Negative. Spleen: Negative. Adrenals/Urinary Tract: Normal adrenal glands. Bilateral renal enhancement is symmetric and within normal limits. The kidneys appear stable. No hydronephrosis or hydroureter. The urinary bladder is distended with an estimated bladder volume of 634 milliliters, but otherwise appears normal. Stomach/Bowel: Moderate diverticulosis and redundancy of the sigmoid colon without active inflammation. Similar retained stool throughout the colon to the 2018 comparison. Redundant colonic flexures. Intermittent diverticulosis in the proximal large bowel. No acute large bowel inflammation. No dilated small bowel loops. There is flocculated material in the distal small bowel. The stomach is within normal limits. The duodenum appears normal aside from a chronic duodenum diverticulum measuring 3.5 centimeters on series 7, image 192. No abdominal free air.  No free fluid. Lymphatic: No lymphadenopathy. Reproductive: Surgically absent. Other: No pelvic free fluid. Musculoskeletal: Stable visualized osseous structures. Intermittent advanced lumbar spine degeneration. Osteopenia. Review of the MIP images confirms the above findings. IMPRESSION: 1. Positive  for Aortic Atherosclerosis (ICD10-I70.0) but negative for aortic dissection or aneurysm. The central pulmonary arteries appear patent. 2. Apparent gallbladder inflammation, although the gallbladder appears partially contracted. Recommend follow-up Right Upper Quadrant Ultrasound to evaluate for possible acute cholecystitis. 3. Retained or aspirated secretions in the trachea, but no pneumonia at this time. 4. Moderate to large layering right and small layering left pleural effusions with compressive atelectasis. Consider superimposed acute pulmonary interstitial edema. 5. Chronic cardiomegaly and coronary artery atherosclerosis. 6. Distended urinary bladder, 634 mL. Electronically Signed   By: Genevie Ann M.D.   On: 10/29/2017 20:41   US Abdomen Limited Ruq  Result Date: 10/29/2017 CLINICAL DATA:  84 year old female with appearance of gallbladder inflammation on CTA chest abdomen and pelvis earlier today performed for chest and back pain. EXAM: ULTRASOUND ABDOMEN LIMITED RIGHT UPPER QUADRANT COMPARISON:  CTA chest abdomen and pelvis 2011 hours on 10/29/2017. FINDINGS: Gallbladder: The gallbladder lumen is free of echogenic sludge or stones. There is mild gallbladder wall thickening measuring 4 millimeters (image 8) with suggestion of trace pericholecystic fluid (image 14). However, no sonographic Murphy sign was elicited. Common bile duct: Diameter: 4 millimeters, normal. Liver: Several simple, benign cysts are noted including in the left lobe measuring 16 millimeters (image 37), and the central liver measuring 15  millimeters (image 41). There is a slightly more complex but benign appearing cyst also in the inferior right lobe on image 52 measuring 14 millimeters. No intrahepatic biliary ductal dilatation. Portal vein is patent on color Doppler imaging with normal direction of blood flow towards the liver. Other findings: There is a trace amount of free fluid adjacent to the liver on image 66. Negative visible right  kidney. IMPRESSION: 1. Positive for mild gallbladder wall thickening and trace pericholecystic fluid, but negative for cholelithiasis or sonographic Murphy sign. Superimposed trace amount of nonspecific perihepatic free fluid. This constellation favors reactive gallbladder wall thickening rather than acute acalculus cholecystitis. 2. Several benign hepatic cysts are incidentally noted. Electronically Signed   By: Genevie Ann M.D.   On: 10/29/2017 23:11        Scheduled Meds: . albuterol  2.5 mg Nebulization Q6H  . furosemide  40 mg Intravenous Q12H  . gabapentin  600 mg Oral QID  . potassium chloride  20 mEq Oral BID  . sodium chloride flush  3 mL Intravenous Q12H   Continuous Infusions: . sodium chloride       LOS: 1 day     Georgette Shell, MD [facility]  If 7PM-7AM, please contact night-coverage [email or web address] Password [email or web address] 10/31/2017, 10:34 AM

## 2017-10-31 NOTE — Progress Notes (Signed)
  Echocardiogram 2D Echocardiogram has been performed.  Jennette Dubin 10/31/2017, 3:06 PM

## 2017-11-01 DIAGNOSIS — I4819 Other persistent atrial fibrillation: Secondary | ICD-10-CM

## 2017-11-01 DIAGNOSIS — I481 Persistent atrial fibrillation: Secondary | ICD-10-CM

## 2017-11-01 LAB — BASIC METABOLIC PANEL
Anion gap: 8 (ref 5–15)
BUN: 15 mg/dL (ref 6–20)
CALCIUM: 9.3 mg/dL (ref 8.9–10.3)
CO2: 30 mmol/L (ref 22–32)
CREATININE: 0.88 mg/dL (ref 0.44–1.00)
Chloride: 100 mmol/L — ABNORMAL LOW (ref 101–111)
GFR calc Af Amer: >60 mL/min (ref >60)
GFR calc non Af Amer: 57 mL/min — ABNORMAL LOW (ref >60)
GLUCOSE: 109 mg/dL — AB (ref 65–99)
POTASSIUM: 4.1 mmol/L (ref 3.5–5.1)
SODIUM: 138 mmol/L (ref 135–145)

## 2017-11-01 LAB — PROTIME-INR
INR: 1.93
PROTHROMBIN TIME: 21.9 s — AB (ref 11.4–15.2)

## 2017-11-01 MED ORDER — GABAPENTIN 600 MG PO TABS
600.0000 mg | ORAL_TABLET | Freq: Three times a day (TID) | ORAL | Status: DC
  Administered 2017-11-01 – 2017-11-03 (×6): 600 mg via ORAL
  Filled 2017-11-01 (×6): qty 1

## 2017-11-01 MED ORDER — PANTOPRAZOLE SODIUM 40 MG PO TBEC
40.0000 mg | DELAYED_RELEASE_TABLET | Freq: Every day | ORAL | Status: DC
  Administered 2017-11-01 – 2017-11-03 (×3): 40 mg via ORAL
  Filled 2017-11-01 (×3): qty 1

## 2017-11-01 MED ORDER — WARFARIN SODIUM 4 MG PO TABS
4.0000 mg | ORAL_TABLET | Freq: Once | ORAL | Status: AC
  Administered 2017-11-01: 4 mg via ORAL
  Filled 2017-11-01 (×3): qty 1

## 2017-11-01 MED ORDER — WARFARIN - PHARMACIST DOSING INPATIENT
Freq: Every day | Status: DC
  Administered 2017-11-01 – 2017-11-02 (×2)

## 2017-11-01 NOTE — Progress Notes (Signed)
PROGRESS NOTE    Sharon Little  NAT:[id number] DOB: 01/28/1929 DOA: 10/29/2017 PCP: Caryl Bis, MD  Brief Narrative:84 y.o.femalewith medical history significant fordiastolic chf, intraventricular conduction delay, chronic atrial fibrillation, chronic back pain on daily opioid, moderate aortic stenosis, presenting with the above.  Says symptoms have been building for a week or two. Has also noticed increased LE swelling for several days. Today her shortness of breath was significant and she called EMS. Denies chest pain, denies cough, denies vomiting, denies abdominal pain. Says has doubled up on lasix last few days but swelling and sob continued to worsen. Is unsure of dry weight - doesn't weigh self often - but thinks probably in the 170s. Compliant with medications. Is not on home O2.  Also complains of back pain. Chronic problem present for years, comes and goes, currently present. Says has been referred to pain mgmt but hasn't been scheduled yet. Denies new bowel/bladder dysfunction, denies saddle anesthesia, denies new gait abnormality or LE weakness/numbness.     Assessment & Plan:   Active Problems:   Edema   IVCD (intraventricular conduction defect)   Warfarin anticoagulation   Back pain   Chronic atrial fibrillation (HCC)   Acute on chronic diastolic CHF (congestive heart failure) (HCC)   Moderate aortic valve stenosis   Hypoxia   Acute exacerbation of CHF (congestive heart failure) (HCC)   Acute on chronic heart failure (HCC)   Symptomatic bradycardia   1]Acute on chronic CHF -ECHO showsLeft ventricle: The cavity size was normal. Wall thickness was   increased in a pattern of mild LVH. Systolic function was normal.   The estimated ejection fraction was in the range of 55% to 60%.   Wall motion was normal; there were no regional wall motion   abnormalities. The study is not technically sufficient to allow   evaluation of LV diastolic function. - Aortic valve:  Severely calcified annulus. Moderately calcified   leaflets. There was moderate to severe stenosis. There was mild   regurgitation. Peak velocity (S): 330 cm/s. Mean gradient (S): 24   mm Hg. Valve area (VTI): 0.99 cm^2. Valve area (Vmax): 1 cm^2.   Valve area (Vmean): 1.02 cm^2. - Mitral valve: Moderately calcified annulus. There was moderate   regurgitation. - Left atrium: The atrium was severely dilated. - Right ventricle: Systolic function was mildly to moderately   reduced. - Right atrium: The atrium was severely dilated. - Atrial septum: No defect or patent foramen ovale was identified. - Tricuspid valve: There was moderate regurgitation. - Inferior vena cava: The vessel was dilated. The respirophasic   diameter changes were blunted (< 50%), consistent with elevated   central venous pressure. Estimated CVP 15 mmHg.  orderd.  Continue IV Lasix.  Follow-up chest x-ray today.  Echo to evaluate aortic valve.  Continue to hold Cardizem  due to bradycardia though improved.  2] chronic atrial fibrillation with bradycardia improved since holding Cardizem.  On Coumadin followed by pharmacy   3]htn-elevated noted losartan started by cardiology.  Continue to hold Cardizem.  Will add as needed hydralazine.  4] chronic back pain continue tramadol as needed Dilaudid.     DVT prophylaxis: Coumadin Code Status: DO NOT RESUSCITATE Family Communication: None Disposition Plan: Plan discharge in the next day or 2 if okay with cardiology.  Will obtain PT evaluation in preparation for discharge.  Consultants: Cardiology  Procedures: None  Antimicrobials:  None Subjective: Feels better  Objective: Vitals:   11/01/17 4270 11/01/17 0533 11/01/17 6237 11/01/17  1053  BP:    (!) 152/54  Pulse:   (!) 58   Resp:   16   Temp: 98.2 F (36.8 C)     TempSrc: Oral     SpO2:   94%   Weight:  80.1 kg (176 lb 8 oz)    Height:        Intake/Output Summary (Last 24 hours) at 11/01/2017  1201 Last data filed at 11/01/2017 0800 Gross per 24 hour  Intake 240 ml  Output 2675 ml  Net -2435 ml   Filed Weights   10/30/17 0124 10/31/17 0333 11/01/17 0533  Weight: 84.9 kg (187 lb 2.7 oz) 81.8 kg (180 lb 6.4 oz) 80.1 kg (176 lb 8 oz)    Examination:  General exam: Appears calm and comfortable  Respiratory system: Clear to auscultation. Respiratory effort normal. Cardiovascular system: S1 & S2 heard, RRR. No JVD, murmurs, rubs, gallops or clicks. No pedal edema. Gastrointestinal system: Abdomen is nondistended, soft and nontender. No organomegaly or masses felt. Normal bowel sounds heard. Central nervous system: Alert and oriented. No focal neurological deficits. Extremities: Symmetric 5 x 5 power. Skin: No rashes, lesions or ulcers Psychiatry: Judgement and insight appear normal. Mood & affect appropriate.     Data Reviewed: I have personally reviewed following labs and imaging studies  CBC: Recent Labs  Lab 10/29/17 1911 10/29/17 1922 10/30/17 0333  WBC 6.8  --  9.0  HGB 11.7* 11.9* 12.4  HCT 36.1 35.0* 38.3  MCV 98.1  --  99.0  PLT 222  --  916   Basic Metabolic Panel: Recent Labs  Lab 10/29/17 1911 10/29/17 1922 10/29/17 2125 10/30/17 0333 10/31/17 0258 11/01/17 0258  NA  --  144 141 142 141 138  K  --  3.5 3.6 3.9 4.0 4.1  CL  --  108 107 108 105 100*  CO2  --   --  24 23 28 30   GLUCOSE  --  125* 121* 122* 101* 109*  BUN  --  15 15 14 14 15   CREATININE  --  0.90 0.95 0.89  0.89 1.03* 0.88  CALCIUM  --   --  8.9 9.4 9.2 9.3  MG 2.2  --   --   --  2.2  --    GFR: Estimated Creatinine Clearance: 47.1 mL/min (by C-G formula based on SCr of 0.88 mg/dL). Liver Function Tests: Recent Labs  Lab 10/29/17 2125  AST 20  ALT 14  ALKPHOS 61  BILITOT 0.5  PROT 6.6  ALBUMIN 3.9   No results for input(s): LIPASE, AMYLASE in the last 168 hours. No results for input(s): AMMONIA in the last 168 hours. Coagulation Profile: Recent Labs  Lab  10/29/17 1911 10/30/17 0333 10/31/17 0258 11/01/17 0258  INR 4.02* 3.77 3.42 1.93   Cardiac Enzymes: Recent Labs  Lab 10/30/17 0333 10/30/17 0955  TROPONINI 0.07* 0.06*   BNP (last 3 results) No results for input(s): PROBNP in the last 8760 hours. HbA1C: No results for input(s): HGBA1C in the last 72 hours. CBG: No results for input(s): GLUCAP in the last 168 hours. Lipid Profile: No results for input(s): CHOL, HDL, LDLCALC, TRIG, CHOLHDL, LDLDIRECT in the last 72 hours. Thyroid Function Tests: No results for input(s): TSH, T4TOTAL, FREET4, T3FREE, THYROIDAB in the last 72 hours. Anemia Panel: No results for input(s): VITAMINB12, FOLATE, FERRITIN, TIBC, IRON, RETICCTPCT in the last 72 hours. Sepsis Labs: No results for input(s): PROCALCITON, LATICACIDVEN in the last 168 hours.  No  results found for this or any previous visit (from the past 240 hour(s)).       Radiology Studies: Dg Chest 1 View  Result Date: 10/31/2017 CLINICAL DATA:  84 year old female with history of hypoxia. EXAM: CHEST  1 VIEW COMPARISON:  Chest x-ray 10/29/2017. FINDINGS: There is cephalization of the pulmonary vasculature, indistinctness of the interstitial markings, and patchy airspace disease throughout the lungs bilaterally suggestive of moderate pulmonary edema. Small to moderate bilateral pleural effusions (right greater than left). Mild cardiomegaly. Upper mediastinal contours are within normal limits. Aortic atherosclerosis. IMPRESSION: 1. Findings are most compatible with congestive heart failure, as detailed above. 2. Aortic atherosclerosis. Electronically Signed   By: Vinnie Langton M.D.   On: 10/31/2017 15:40        Scheduled Meds: . albuterol  2.5 mg Nebulization Q6H  . furosemide  40 mg Intravenous Q12H  . gabapentin  600 mg Oral QID  . losartan  25 mg Oral Daily  . pantoprazole  40 mg Oral Daily  . potassium chloride  20 mEq Oral BID  . sodium chloride flush  3 mL Intravenous  Q12H  . warfarin  4 mg Oral ONCE-1800  . Warfarin - Pharmacist Dosing Inpatient   Does not apply q1800   Continuous Infusions: . sodium chloride       LOS: 2 days     Georgette Shell, MD [facility]  If 7PM-7AM, please contact night-coverage [email or web address] Password [email or web address] 11/01/2017, 12:01 PM

## 2017-11-01 NOTE — Progress Notes (Signed)
[facility] for Coumadin Indication: atrial fibrillation  Allergies  Allergen Reactions  . Penicillins Swelling and Rash    Has patient had a PCN reaction causing immediate rash, facial/tongue/throat swelling, SOB or lightheadedness with hypotension: Yes Has patient had a PCN reaction causing severe rash involving mucus membranes or skin necrosis: No Has patient had a PCN reaction that required hospitalization No Has patient had a PCN reaction occurring within the last 10 years: No If all of the above answers are "NO", then may proceed with Cephalosporin use.    Patient Measurements: Height: 5\' 5"  (165.1 cm) Weight: 176 lb 8 oz (80.1 kg) IBW/kg (Calculated) : 57   Vital Signs: Temp: 98.2 F (36.8 C) (05/06 0520) Temp Source: Oral (05/06 0520) Pulse Rate: 58 (05/06 0821)  Labs: Recent Labs    10/29/17 1911 10/29/17 1922  10/30/17 0333 10/30/17 0955 10/31/17 0258 11/01/17 0258  HGB 11.7* 11.9*  --  12.4  --   --   --   HCT 36.1 35.0*  --  38.3  --   --   --   PLT 222  --   --  198  --   --   --   LABPROT 38.9*  --   --  36.9*  --  34.3* 21.9*  INR 4.02*  --   --  3.77  --  3.42 1.93  CREATININE  --  0.90   < > 0.89  0.89  --  1.03* 0.88  TROPONINI  --   --   --  0.07* 0.06*  --   --    < > = values in this interval not displayed.    Estimated Creatinine Clearance: 47.1 mL/min (by C-G formula based on SCr of 0.88 mg/dL).  Assessment: 84 yo female with HF on coumadin PTA for afib. Pharmacy consulted to dose. INR elevated on admission likely due to CHF exacerbation  INR today = 1.93  Home dose: 4mg /day except 6mg  on Wed  Goal of Therapy:  INR 2-3 Monitor platelets by anticoagulation protocol: Yes   Plan:  -Warfarin 4 mg po x 1 -Daily PT/INR  Thank you Anette Guarneri, PharmD [phone] 11/01/2017 10:26 AM

## 2017-11-01 NOTE — Progress Notes (Addendum)
Progress Note  Patient Name: Sharon Little Date of Encounter: 11/01/2017  Primary Cardiologist: Dr. Domenic Polite  Subjective   Pt feeling well today. Up to chair on interview. Echocardiogram with results below. Worsening AS. Breathing better and diuresing well.   Inpatient Medications    Scheduled Meds: . albuterol  2.5 mg Nebulization Q6H  . furosemide  40 mg Intravenous Q12H  . gabapentin  600 mg Oral QID  . losartan  25 mg Oral Daily  . potassium chloride  20 mEq Oral BID  . sodium chloride flush  3 mL Intravenous Q12H   Continuous Infusions: . sodium chloride     PRN Meds: sodium chloride, hydrALAZINE, HYDROmorphone (DILAUDID) injection, ondansetron, sodium chloride flush, traMADol   Vital Signs    Vitals:   10/31/17 2210 11/01/17 0520 11/01/17 0533 11/01/17 0821  BP: (!) 163/65     Pulse: 61   (!) 58  Resp: 10   16  Temp: 98.3 F (36.8 C) 98.2 F (36.8 C)    TempSrc: Oral Oral    SpO2: 96%   94%  Weight:   176 lb 8 oz (80.1 kg)   Height:        Intake/Output Summary (Last 24 hours) at 11/01/2017 0944 Last data filed at 11/01/2017 0532 Gross per 24 hour  Intake -  Output 2675 ml  Net -2675 ml   Filed Weights   10/30/17 0124 10/31/17 0333 11/01/17 0533  Weight: 187 lb 2.7 oz (84.9 kg) 180 lb 6.4 oz (81.8 kg) 176 lb 8 oz (80.1 kg)    Physical Exam   General: Elderly, NAD Skin: Warm, dry, intact  Head: Normocephalic, atraumatic, clear, moist mucus membranes. Neck: Negative for carotid bruits. No JVD Lungs:Clear to ausculation bilaterally. No wheezes, rales, or rhonchi. Breathing is unlabored. Cardiovascular: Irregularly irregular with S1 S2. + murmur  Abdomen: Soft, non-tender, non-distended with normoactive bowel sounds.  No obvious abdominal masses. MSK: Strength and tone appear normal for age. 5/5 in all extremities Extremities: No edema. No clubbing or cyanosis. DP/PT pulses 1+ bilaterally Neuro: Alert and oriented. No focal deficits. No facial  asymmetry. MAE spontaneously. Psych: Responds to questions appropriately with normal affect.    Labs    Chemistry Recent Labs  Lab 10/29/17 2125 10/30/17 0333 10/31/17 0258 11/01/17 0258  NA 141 142 141 138  K 3.6 3.9 4.0 4.1  CL 107 108 105 100*  CO2 24 23 28 30   GLUCOSE 121* 122* 101* 109*  BUN 15 14 14 15   CREATININE 0.95 0.89  0.89 1.03* 0.88  CALCIUM 8.9 9.4 9.2 9.3  PROT 6.6  --   --   --   ALBUMIN 3.9  --   --   --   AST 20  --   --   --   ALT 14  --   --   --   ALKPHOS 61  --   --   --   BILITOT 0.5  --   --   --   GFRNONAA 52* 57*  57* 47* 57*  GFRAA >60 >60  >60 55* >60  ANIONGAP 10 11 8 8      Hematology Recent Labs  Lab 10/29/17 1911 10/29/17 1922 10/30/17 0333  WBC 6.8  --  9.0  RBC 3.68*  --  3.87  HGB 11.7* 11.9* 12.4  HCT 36.1 35.0* 38.3  MCV 98.1  --  99.0  MCH 31.8  --  32.0  MCHC 32.4  --  32.4  RDW 14.4  --  14.7  PLT 222  --  198    Cardiac Enzymes Recent Labs  Lab 10/30/17 0333 10/30/17 0955  TROPONINI 0.07* 0.06*    Recent Labs  Lab 10/29/17 1919 10/29/17 2300  TROPIPOC 0.03 0.03     BNP Recent Labs  Lab 10/29/17 1902  BNP 337.1*     DDimer No results for input(s): DDIMER in the last 168 hours.   Radiology    Dg Chest 1 View  Result Date: 10/31/2017 CLINICAL DATA:  84 year old female with history of hypoxia. EXAM: CHEST  1 VIEW COMPARISON:  Chest x-ray 10/29/2017. FINDINGS: There is cephalization of the pulmonary vasculature, indistinctness of the interstitial markings, and patchy airspace disease throughout the lungs bilaterally suggestive of moderate pulmonary edema. Small to moderate bilateral pleural effusions (right greater than left). Mild cardiomegaly. Upper mediastinal contours are within normal limits. Aortic atherosclerosis. IMPRESSION: 1. Findings are most compatible with congestive heart failure, as detailed above. 2. Aortic atherosclerosis. Electronically Signed   By: Vinnie Langton M.D.   On: 10/31/2017  15:40    Telemetry    11/01/17 Atrial fibrillation with frequent PVCs HR 59  - Personally Reviewed  ECG    10/30/17: Atrial fibrillation with HR 44 and PVC - Personally Reviewed  Cardiac Studies   Echocardiogram 10/31/2017: Study Conclusions  - Left ventricle: The cavity size was normal. Wall thickness was   increased in a pattern of mild LVH. Systolic function was normal.   The estimated ejection fraction was in the range of 55% to 60%.   Wall motion was normal; there were no regional wall motion   abnormalities. The study is not technically sufficient to allow   evaluation of LV diastolic function. - Aortic valve: Severely calcified annulus. Moderately calcified   leaflets. There was moderate to severe stenosis. There was mild   regurgitation. Peak velocity (S): 330 cm/s. Mean gradient (S): 24   mm Hg. Valve area (VTI): 0.99 cm^2. Valve area (Vmax): 1 cm^2.   Valve area (Vmean): 1.02 cm^2. - Mitral valve: Moderately calcified annulus. There was moderate   regurgitation. - Left atrium: The atrium was severely dilated. - Right ventricle: Systolic function was mildly to moderately   reduced. - Right atrium: The atrium was severely dilated. - Atrial septum: No defect or patent foramen ovale was identified. - Tricuspid valve: There was moderate regurgitation. - Inferior vena cava: The vessel was dilated. The respirophasic   diameter changes were blunted (< 50%), consistent with elevated   central venous pressure. Estimated CVP 15 mmHg.  ------------------------------------------------------------------- Study data:  Comparison was made to the study of 04/22/2016.  Study status:  Routine.  Procedure:  The patient reported no pain pre or post test. Transthoracic echocardiography. Image quality was adequate.  Study completion:  There were no complications. Transthoracic echocardiography.  M-mode, complete 2D, spectral Doppler, and color Doppler.  Birthdate:  Patient  birthdate: 1928-12-08.  Age:  Patient is 84 yr old.  Sex:  Gender: female. BMI: 30 kg/m^2.  Blood pressure:     108/78  Patient status: Inpatient.  Study date:  Study date: 10/31/2017. Study time: 02:23 PM.  Location:  Echo laboratory.   Patient Profile     84 y.o. female with history of moderate aortic stenosis, diastolic congestive heart failure, permanent atrial fibrillation on Coumadin and diltiazem for rate control presenting with worsening shortness of breath as her primary complaint.  Assessment & Plan    1. Acute on chronic diastolic  heart failure:  -Echocardiogram 10/31/2017 with LVEF of 55 to 60% with no wall motion abnormalities.  Study is not technically sufficient to allow evaluation of LV diastolic function.  Moderate to severe aortic stenosis and moderate mitral regurgitation.  -Weight, 176lb today, 187lb on admission. Baseline weight appears to be in the 170-175lb range -I&O, net -4.9 L since admission, total 24-hour urine output 2.9 L -IV Lasix 40 mg BID  2.  Aortic stenosis: -Last evaluation, described as moderate to mild in October 2017 however, per echocardiogram 10/31/2017 aortic stenosis now described as moderate to severe. -Mean gradient 24 mmHg>> see echocardiogram results as above.  2.  CAD: -Moderate but nonobstructive CAD in 03/2016 -Patient denies chest pain -Mild elevation in troponins, peak 0.07  4.  Permanent atrial fibrillation/bradycardia: -Diltiazem on hold secondary to bradycardia on admission>>>HR now stabilized in the 49-61 range. She is having frequent PVC's  -INR supratherapeutic at 3.42 on 10/31/17>>>1.93 today -Pharmacy consultation secondary to reinitiation of Coumadin once INR stabilized    5.  Hypertension: -Elevated, 163/65, 144/60, 188/78 -Diltiazem on hold secondary to bradycardia on admission 09/29/2017 -Losartan 25 mg started 10/31/2017>>creatinine stable at 0.88 today    Signed, Kathyrn Drown NP-C [place] Pager:  [phone] 11/01/2017, 9:44 AM     For questions or updates, please contact   Please consult [email or web address] for contact info under Cardiology/STEMI.   Attending Note:   The patient was seen and examined.  Agree with assessment and plan as noted above.  Changes made to the above note as needed.  Patient seen and independently examined with Kathyrn Drown, NP .   We discussed all aspects of the encounter. I agree with the assessment and plan as stated above.  1.   Acute on chronic diastolic congestive heart failure the patient has diuresed fairly well.  She is not having any significant shortness of breath.  She has improved.  Her aortic stenosis is moderate in severity by gradient.  I do not think that she needs aortic valve replacement or TAVR at this time.  Medications.  2.  Aortic stenosis: Her mean aortic valve gradient is 24 mmHg.  She is 84 years old.  She is fairly frail.  I am not sure that she is a candidate for TAVR.  I would not think that she is a candidate for standard aortic valve replacement.  3.  Mild troponin elevation: The patient had moderate coronary artery disease by heart catheterization in 2017.  She denies any chest pain.  Troponin level is 0.07.  This is likely demand ischemia.  She does not need any further evaluation of this at this time.  4.  Atrial fibrillation: The patient has permanent atrial fibrillation.  She was on Cardizem.  She was bradycardic and the Cardizem has now been held.    I have spent a total of 40 minutes with patient reviewing hospital  notes , telemetry, EKGs, labs and examining patient as well as establishing an assessment and plan that was discussed with the patient. > 50% of time was spent in direct patient care.   Thayer Headings, Brooke Bonito., MD, [facility] 11/01/2017, 12:06 PM [address]  [place] Pager [phone]

## 2017-11-02 ENCOUNTER — Other Ambulatory Visit: Payer: Self-pay

## 2017-11-02 ENCOUNTER — Encounter (HOSPITAL_COMMUNITY): Payer: Self-pay | Admitting: General Practice

## 2017-11-02 DIAGNOSIS — J9601 Acute respiratory failure with hypoxia: Secondary | ICD-10-CM

## 2017-11-02 LAB — BASIC METABOLIC PANEL
ANION GAP: 8 (ref 5–15)
BUN: 20 mg/dL (ref 6–20)
CALCIUM: 10 mg/dL (ref 8.9–10.3)
CHLORIDE: 100 mmol/L — AB (ref 101–111)
CO2: 31 mmol/L (ref 22–32)
Creatinine, Ser: 0.97 mg/dL (ref 0.44–1.00)
GFR calc non Af Amer: 51 mL/min — ABNORMAL LOW (ref >60)
GFR, EST AFRICAN AMERICAN: 59 mL/min — AB (ref >60)
GLUCOSE: 109 mg/dL — AB (ref 65–99)
Potassium: 4.4 mmol/L (ref 3.5–5.1)
Sodium: 139 mmol/L (ref 135–145)

## 2017-11-02 LAB — PROTIME-INR
INR: 1.36
PROTHROMBIN TIME: 16.7 s — AB (ref 11.4–15.2)

## 2017-11-02 LAB — MAGNESIUM: Magnesium: 2.3 mg/dL (ref 1.7–2.4)

## 2017-11-02 MED ORDER — WARFARIN SODIUM 3 MG PO TABS
6.0000 mg | ORAL_TABLET | Freq: Once | ORAL | Status: DC
  Filled 2017-11-02: qty 2

## 2017-11-02 MED ORDER — ALBUTEROL SULFATE (2.5 MG/3ML) 0.083% IN NEBU
2.5000 mg | INHALATION_SOLUTION | Freq: Three times a day (TID) | RESPIRATORY_TRACT | Status: DC
  Administered 2017-11-02: 2.5 mg via RESPIRATORY_TRACT
  Filled 2017-11-02: qty 3

## 2017-11-02 MED ORDER — FUROSEMIDE 40 MG PO TABS: 40.0000 mg | ORAL_TABLET | Freq: Two times a day (BID) | ORAL | 11 refills | Status: DC

## 2017-11-02 MED ORDER — WARFARIN SODIUM 3 MG PO TABS
6.0000 mg | ORAL_TABLET | Freq: Once | ORAL | Status: AC
  Administered 2017-11-02: 6 mg via ORAL
  Filled 2017-11-02: qty 2

## 2017-11-02 MED ORDER — GABAPENTIN 600 MG PO TABS: 600.0000 mg | ORAL_TABLET | Freq: Three times a day (TID) | ORAL | 0 refills | Status: AC

## 2017-11-02 MED ORDER — ALBUTEROL SULFATE (2.5 MG/3ML) 0.083% IN NEBU: 2.5000 mg | INHALATION_SOLUTION | RESPIRATORY_TRACT | Status: DC | PRN

## 2017-11-02 MED ORDER — POTASSIUM CHLORIDE CRYS ER 20 MEQ PO TBCR: 20.0000 meq | EXTENDED_RELEASE_TABLET | Freq: Two times a day (BID) | ORAL | 0 refills | Status: AC

## 2017-11-02 MED ORDER — LOSARTAN POTASSIUM 25 MG PO TABS: 25.0000 mg | ORAL_TABLET | Freq: Every day | ORAL | 0 refills | Status: AC

## 2017-11-02 NOTE — Clinical Social Work Note (Signed)
Clinical Social Work Assessment  Patient Details  Name: Sharon Little MRN: [id number] Date of Birth: 07-01-1928  Date of referral:  11/02/17               Reason for consult:  Facility Placement                Permission sought to share information with:  Family Supports, Customer service manager Permission granted to share information::  Yes, Verbal Permission Granted  Name::     Micah Noel  Agency::  SNFs  Relationship::  sister  Contact Information:  [phone]  Housing/Transportation Living arrangements for the past 2 months:  [place] of Information:  Patient, Siblings, Medical Team Patient Interpreter Needed:  None Criminal Activity/Legal Involvement Pertinent to Current Situation/Hospitalization:  No - Comment as needed Significant Relationships:  Siblings Lives with:  Self Do you feel safe going back to the place where you live?  Yes Need for family participation in patient care:  No (Coment)  Care giving concerns: Patient from home. PT recommending SNF. Family unable to provide 24/7 support at home.   Social Worker assessment / plan: CSW spoke to patient's sister, Lelan Pons, via phone and also met with patient at bedside. Patient deferred to her sister for making decisions about SNF. Sister and patient's first choice is [facility] and second [facility]. CSW sent out initial referrals.  No bed offer from [facility], but [facility] has offered. Patient and sister agreeable to [facility]. CSW supporting with discharge to facility today.   Employment status:  Retired Forensic scientist:  Medicare PT Recommendations:  [place] / Referral to community resources:  [place]  Patient/Family's Response to care: Patient and sister appreciative of care.  Patient/Family's Understanding of and Emotional Response to Diagnosis, Current Treatment, and Prognosis: Sister with good understanding of patient's  condition and patient and family agreeable to SNF.  Emotional Assessment Appearance:  Appears stated age Attitude/Demeanor/Rapport:  Engaged Affect (typically observed):  Accepting, Calm Orientation:  Oriented to Self, Oriented to Place, Oriented to  Time, Oriented to Situation Alcohol / Substance use:  Not Applicable Psych involvement (Current and /or in the community):  No (Comment)  Discharge Needs  Concerns to be addressed:  Care Coordination, Discharge Planning Concerns Readmission within the last 30 days:  No Current discharge risk:  Physical Impairment, Lives alone Barriers to Discharge:  Continued Medical Work up   Estanislado Emms, LCSW 11/02/2017, 2:36 PM

## 2017-11-02 NOTE — Evaluation (Addendum)
Physical Therapy Evaluation Patient Details Name: Sharon Little MRN: [id number] DOB: [birth year] Today's Date: 11/02/2017   History of Present Illness  84 y.o. female with medical history significant for diastolic chf, intraventricular conduction delay, chronic atrial fibrillation, chronic back pain on daily opioid, moderate aortic stenosis, presenting with LE swelling and SOB.  Clinical Impression  Pt admitted with above diagnosis. Pt currently with functional limitations due to the deficits listed below (see PT Problem List). Pt was able to ambulate in room with need for O2.  Pt desats to 84% with activity without O2 and with 2L, stays above 90%. Also, HR 54 bpm and did jump to 108 at one point during walk.  Recommend that sister incr care in home to 24 hour care initially with [facility] f/u.  If sister cant may need short term SNF.  Will follow acutely.   Pt will benefit from skilled PT to increase their independence and safety with mobility to allow discharge to the venue listed below.    SATURATION QUALIFICATIONS: (This note is used to comply with regulatory documentation for home oxygen)  Patient Saturations on Room Air at Rest = 89%  Patient Saturations on Room Air while Ambulating = 84%  Patient Saturations on 2 Liters of oxygen while Ambulating = 95%  Please briefly explain why patient needs home oxygen:Pt desats without O2 with activity.  Will need home O2.    Follow Up Recommendations Home health PT;Supervision/Assistance - 24 hour(HHOT)    Equipment Recommendations  Other (comment)(Tub bench- HHOT to assess in the home), home O2   Recommendations for Other Services       Precautions / Restrictions Precautions Precautions: Fall Restrictions Weight Bearing Restrictions: No      Mobility  Bed Mobility Overal bed mobility: Independent                Transfers Overall transfer level: Needs assistance Equipment used: Rolling walker (2 wheeled) Transfers: Sit to/from  Stand Sit to Stand: Supervision         General transfer comment: Did not need physical assist but did guard pt.   Ambulation/Gait Ambulation/Gait assistance: Min guard Ambulation Distance (Feet): 70 Feet(50 feet then 20 feet) Assistive device: Rolling walker (2 wheeled) Gait Pattern/deviations: Step-through pattern;Decreased stride length;Trunk flexed;Wide base of support   Gait velocity interpretation: <1.8 ft/sec, indicate of risk for recurrent falls General Gait Details: Pt was able to ambulate with RW with flexed posture but was stable and can maneuver RW in tighter spaces.  Pt does desat at times.  Needed 2L O2 to keep sats >90% with activity and at rest.    Stairs            Wheelchair Mobility    Modified Rankin (Stroke Patients Only)       Balance Overall balance assessment: Needs assistance Sitting-balance support: No upper extremity supported;Feet supported Sitting balance-Leahy Scale: Good     Standing balance support: Bilateral upper extremity supported;During functional activity Standing balance-Leahy Scale: Poor Standing balance comment: relies on RW for balance.                               Pertinent Vitals/Pain Pain Assessment: No/denies pain    Home Living Family/patient expects to be discharged to:: Private residence Living Arrangements: Children;Other relatives(son with special needs and sister) Available Help at Discharge: Available 24 hours/day;Available PRN/intermittently;Family(son 24 hour supervision, sister intermittent assist) Type of Home: House Home Access: Stairs to  enter Entrance Stairs-Rails: Right Entrance Stairs-Number of Steps: 5 Home Layout: One level Home Equipment: Walker - 2 wheels;Bedside commode      Prior Function Level of Independence: Independent with assistive device(s)         Comments: used RW at all times,   sister would help with sponge baths per pt     Hand Dominance         Extremity/Trunk Assessment   Upper Extremity Assessment Upper Extremity Assessment: Defer to OT evaluation    Lower Extremity Assessment Lower Extremity Assessment: Generalized weakness    Cervical / Trunk Assessment Cervical / Trunk Assessment: Kyphotic  Communication   Communication: No difficulties  Cognition Arousal/Alertness: Awake/alert Behavior During Therapy: WFL for tasks assessed/performed Overall Cognitive Status: Impaired/Different from baseline Area of Impairment: Memory;Following commands;Safety/judgement;Problem solving                       Following Commands: Follows one step commands with increased time Safety/Judgement: Decreased awareness of safety   Problem Solving: Slow processing;Difficulty sequencing;Requires verbal cues        General Comments      Exercises     Assessment/Plan    PT Assessment Patient needs continued PT services  PT Problem List Decreased activity tolerance;Decreased balance;Decreased mobility;Decreased knowledge of use of DME;Decreased safety awareness;Decreased knowledge of precautions;Cardiopulmonary status limiting activity       PT Treatment Interventions DME instruction;Gait training;Stair training;Functional mobility training;Therapeutic activities;Therapeutic exercise;Balance training;Patient/family education    PT Goals (Current goals can be found in the Care Plan section)  Acute Rehab PT Goals Patient Stated Goal: to go home PT Goal Formulation: With patient Time For Goal Achievement: 11/16/17 Potential to Achieve Goals: Good    Frequency Min 3X/week   Barriers to discharge        Co-evaluation               AM-PAC PT "6 Clicks" Daily Activity  Outcome Measure Difficulty turning over in bed (including adjusting bedclothes, sheets and blankets)?: None Difficulty moving from lying on back to sitting on the side of the bed? : None Difficulty sitting down on and standing up from a chair with  arms (e.g., wheelchair, bedside commode, etc,.)?: A Little Help needed moving to and from a bed to chair (including a wheelchair)?: A Little Help needed walking in hospital room?: A Little Help needed climbing 3-5 steps with a railing? : A Lot 6 Click Score: 19    End of Session Equipment Utilized During Treatment: Gait belt;Oxygen Activity Tolerance: Patient limited by fatigue Patient left: in chair;with call bell/phone within reach;with chair alarm set Nurse Communication: Mobility status PT Visit Diagnosis: Unsteadiness on feet (R26.81);Muscle weakness (generalized) (M62.81)    Time: 7510-2585 PT Time Calculation (min) (ACUTE ONLY): 29 min   Charges:   PT Evaluation $PT Eval Moderate Complexity: 1 Mod PT Treatments $Gait Training: 8-22 mins   PT G Codes:        Nain Rudd,PT Acute Rehabilitation [phone] (pager)   Denice Paradise 11/02/2017, 10:09 AM

## 2017-11-02 NOTE — Progress Notes (Signed)
SATURATION QUALIFICATIONS: (This note is used to comply with regulatory documentation for home oxygen)  Patient Saturations on Room Air at Rest = 89%  Patient Saturations on Room Air while Ambulating = 84%  Patient Saturations on 2 Liters of oxygen while Ambulating = 95%  Please briefly explain why patient needs home oxygen:Pt desats without O2 with activity.  Will need home O2.  Thanks.  [place] [phone] (pager)

## 2017-11-02 NOTE — Progress Notes (Signed)
CSW received call from RN that patient will not discharge today as planned. CSW updated facility - they will still have a bed for patient tomorrow. CSW also called patient's sister and gave her an update. CSW to support with discharge when medically ready.  Estanislado Emms, [place]

## 2017-11-02 NOTE — Plan of Care (Signed)
  Problem: Clinical Measurements: Goal: Ability to maintain clinical measurements within normal limits will improve Outcome: Progressing Goal: Will remain free from infection Outcome: Progressing Goal: Diagnostic test results will improve Outcome: Progressing Goal: Respiratory complications will improve Outcome: Progressing Goal: Cardiovascular complication will be avoided Outcome: Progressing   Problem: Education: Goal: Knowledge of General Education information will improve Outcome: Progressing   

## 2017-11-02 NOTE — Progress Notes (Addendum)
Patient will discharge to [facility]) Anticipated discharge date: 11/02/17 Family notified: Micah Noel, sister Transportation by: PTAR  Nurse to call report to [phone]. Patient will go to room [facility] 127. Accepting RN will be Nevin Bloodgood.   CSW signing off.  Estanislado Emms, [place]  Clinical Social Worker

## 2017-11-02 NOTE — Progress Notes (Signed)
PT Note  Pt's family is not able to provide 24 hour care at home. Pt will need ST-SNF at this time.   [employer] PT [phone]

## 2017-11-02 NOTE — Consult Note (Signed)
[facility] CM Primary Care Navigator  11/02/2017  Sharon Little [birth year] [id number]   Met withpatient at the bedside toidentify possible discharge needs. Patient reports "irregular heart rateand leg swelling greater to right leg" that resulted to thisadmission. (acute on chronic heart failure, chronic atrial fibrillation).  PatientendorsesDr.Terry Quillian Quince with [facility] astheprimary care provider and cardiologist is Dr. Domenic Polite.   [facility] in [place] toobtain medications without difficulty.  Patientreports that sister Kirstie Mirza) has beenmanagingmedications for her at [facility] use of "pill box" system filled weekly.  Her sisterhas beenprovidingtransportationto herdoctors'appointments.  Patientlives with son but sister serves as her primary caregiver at home.  Per RN report, anticipated plan for discharge isskilled nursing facility (SNF)for rehabilitation.  Patient voiced understandingto callprimarycareprovider'soffice whenshereturns backhome,for a post discharge follow-upvisitwithin1- 2 weeksor sooner if needs arise.Patient letter (with PCP's contact number) was provided asareminder.   Explained topatientregarding [facility] CM services available for health management/ resourcesat homeand she expressed interest about it.Patient mentioned that sister has been managing patient's health needs at home.  Patient verbalizedunderstandingof needto seekreferral from primary care provider to [facility] care management ifdeemed necessary and appropriatefor anyservicesin the nearfuture,once she returnsback home.   [facility] care management information was provided for futureneedsthatshemay have.  Primary care provider's office is listed as providing transition of care (TOC) follow-up. Primary care provider's office called [facility] Sousa) to notify of patient's discharge disposition,  need for post hospital follow-up and transition of care (TOC). Notified also of patient's health issues needing follow-up (mainly congestive HF/ A-fib). Made aware to refer patient to [facility] as deemed appropriate and necessary to have the services.   For additional questions please contact:  Edwena Felty A. Kayhan Boardley, BSN, RN-BC [facility] PRIMARY CARE Navigator Cell: [phone]

## 2017-11-02 NOTE — Progress Notes (Signed)
Patient will not discharge today and foley catheter will be removed per Dr. Zigmund Daniel orders.  Removed patient foley catheter. Patient tolerated removal well. Will continue to monitor bladder activity and urination.

## 2017-11-02 NOTE — Clinical Social Work Placement (Signed)
   CLINICAL SOCIAL WORK PLACEMENT  NOTE  Date:  11/02/2017  Patient Details  Name: Sharon Little MRN: [id number] Date of Birth: 11/23/1928  Clinical Social Work is seeking post-discharge placement for this patient at the [place] level of care (*CSW will initial, date and re-position this form in  chart as items are completed):  Yes   Patient/family provided with [place] Work Department's list of facilities offering this level of care within the geographic area requested by the patient (or if unable, by the patient's family).  Yes   Patient/family informed of their freedom to choose among providers that offer the needed level of care, that participate in Medicare, Medicaid or managed care program needed by the patient, have an available bed and are willing to accept the patient.  Yes   Patient/family informed of 's ownership interest in [facility] and [facility], as well as of the fact that they are under no obligation to receive care at these facilities.  PASRR submitted to EDS on       PASRR number received on       Existing PASRR number confirmed on 11/02/17     FL2 transmitted to all facilities in geographic area requested by pt/family on 11/02/17     FL2 transmitted to all facilities within larger geographic area on       Patient informed that his/her managed care company has contracts with or will negotiate with certain facilities, including the following:  [facility]     Yes   Patient/family informed of bed offers received.  Patient chooses bed at [facility]     Physician recommends and patient chooses bed at      Patient to be transferred to [facility] on 11/02/17.  Patient to be transferred to facility by [facility]     Patient family notified on 11/02/17 of transfer.  Name of family member notified:  Micah Noel     PHYSICIAN Please sign DNR     Additional Comment:     _______________________________________________ Estanislado Emms, LCSW 11/02/2017, 2:43 PM

## 2017-11-02 NOTE — Progress Notes (Signed)
[facility] for Coumadin Indication: atrial fibrillation  Allergies  Allergen Reactions  . Penicillins Swelling and Rash    Has patient had a PCN reaction causing immediate rash, facial/tongue/throat swelling, SOB or lightheadedness with hypotension: Yes Has patient had a PCN reaction causing severe rash involving mucus membranes or skin necrosis: No Has patient had a PCN reaction that required hospitalization No Has patient had a PCN reaction occurring within the last 10 years: No If all of the above answers are "NO", then may proceed with Cephalosporin use.    Patient Measurements: Height: 5\' 5"  (165.1 cm) Weight: 172 lb 4.8 oz (78.2 kg) IBW/kg (Calculated) : 57   Vital Signs: Temp: 97.9 F (36.6 C) (05/07 0415) Temp Source: Oral (05/07 0415) BP: 140/77 (05/07 0415) Pulse Rate: 67 (05/07 0415)  Labs: Recent Labs    10/31/17 0258 11/01/17 0258 11/02/17 0332  LABPROT 34.3* 21.9* 16.7*  INR 3.42 1.93 1.36  CREATININE 1.03* 0.88 0.97    Estimated Creatinine Clearance: 42.3 mL/min (by C-G formula based on SCr of 0.97 mg/dL).  Assessment: 84 yo female with HF on coumadin PTA for afib. Pharmacy consulted to dose. INR elevated on admission likely due to CHF exacerbation  INR today = 1.36  Home dose: 4mg /day except 6mg  on Wed  Goal of Therapy:  INR 2-3 Monitor platelets by anticoagulation protocol: Yes   Plan:  -Warfarin 6 mg po x 1 -Daily PT/INR  Thank you Murlean Iba, PharmD Candidate 11/02/2017 10:07 AM

## 2017-11-02 NOTE — Care Management Note (Signed)
Case Management Note Marvetta Gibbons RN, BSN Unit 4E-Case Manager [phone]  Patient Details  Name: Sharon Little MRN: [id number] Date of Birth: [birth year]  Subjective/Objective:  Pt admitted with edema, acute on chronic HF, chronic back pain                 Action/Plan: PTA pt lived at home with adult son who is mentally challenged (per sister does not read or write and has speech impairment)- does not wear home 02- per PT eval recommendation for home 02 and [facility] services if sister can provide increased assistance at home- spoke with sister Lelan Pons via TC- per conversation sister states she can not provide any further assistance at home and there are no other family member that can provide increased care at home- sister feels STSNF would be safest plan for discharge- states pt went to SNF [facility]) back in Jan. Would be agreeable to going there again or [facility]- have consulted CSW for placement needs- notified MD regarding change in d/c plan. Pt will need 02 at discharge- CSW aware as well as sister. Have spoken with PT who will update note to reflect need for STSNF.   Expected Discharge Date:  11/02/17               Expected Discharge Plan:  Skilled Nursing Facility  In-House Referral:  Clinical Social Work  Discharge planning Services  CM Consult  Post Acute Care Choice:  Home Health Choice offered to:  Sibling  DME Arranged:    DME Agency:     HH Arranged:  PT HH Agency:     Status of Service:  Completed, signed off  If discussed at [employer] of Stay Meetings, dates discussed:    Discharge Disposition: skilled facility   Additional Comments:  Dawayne Patricia, RN 11/02/2017, 1:49 PM

## 2017-11-02 NOTE — Discharge Summary (Addendum)
Physician Discharge Summary  Sharon Little OFB:[id number] DOB: [birth year] DOA: 10/29/2017  PCP: Caryl Bis, MD  Admit date: 10/29/2017 Discharge date: 11/03/2017 Admitted From: Home Disposition: Home  Recommendations for Outpatient Follow-up:  1. Follow up with PCP in 1-2 weeks 2. Please obtain BMP/CBC in one week 3. Follow-up with Dr. Domenic Polite cardiology in 2 weeks Home Health yes Equipment/Devices: Oxygen Discharge Condition stable CODE STATUS DO NOT RESUSCITATE  Diet recommendation: Cardiac diet  Brief/Interim Summary::84 y.o.femalewith medical history significant fordiastolic chf, intraventricular conduction delay, chronic atrial fibrillation, chronic back pain on daily opioid, moderate aortic stenosis, presenting with the above.  Says symptoms have been building for a week or two. Has also noticed increased LE swelling for several days. Today her shortness of breath was significant and she called EMS. Denies chest pain, denies cough, denies vomiting, denies abdominal pain. Says has doubled up on lasix last few days but swelling and sob continued to worsen. Is unsure of dry weight - doesn't weigh self often - but thinks probably in the 170s. Compliant with medications. Is not on home O2.  Also complains of back pain. Chronic problem present for years, comes and goes, currently present. Says has been referred to pain mgmt but hasn't been scheduled yet. Denies new bowel/bladder dysfunction, denies saddle anesthesia, denies new gait abnormality or LE weakness/numbness.     Discharge Diagnoses:  Active Problems:   Edema   IVCD (intraventricular conduction defect)   Warfarin anticoagulation   Back pain   Chronic atrial fibrillation (HCC)   Acute on chronic diastolic CHF (congestive heart failure) (HCC)   Nonrheumatic aortic valve stenosis   Hypoxia   Acute exacerbation of CHF (congestive heart failure) (HCC)   Acute on chronic heart failure (HCC)   Symptomatic  bradycardia   Persistent atrial fibrillation (HCC)  1]Acute on chronic CHF -ECHO showsLeft ventricle: The cavity size was normal. Wall thickness was increased in a pattern of mild LVH. Systolic function was normal. The estimated ejection fraction was in the range of 55% to 60%. Wall motion was normal; there were no regional wall motion abnormalities. The study is not technically sufficient to allow evaluation of LV diastolic function. - Aortic valve: Severely calcified annulus. Moderately calcified leaflets. There was MODERATE TO SEVERE AS.There was mild regurgitation. Peak velocity (S): 330 cm/s. Mean gradient (S): 24 mm Hg. Valve area (VTI): 0.99 cm^2. Valve area (Vmax): 1 cm^2. Valve area (Vmean): 1.02 cm^2. - Mitral valve: Moderately calcified annulus. There was moderate regurgitation. - Left atrium: The atrium was severely dilated. - Right ventricle: Systolic function was mildly to moderately reduced. - Right atrium: The atrium was severely dilated. - Atrial septum: No defect or patent foramen ovale was identified. - Tricuspid valve: There was moderate regurgitation. - Inferior vena cava: The vessel was dilated. The respirophasic diameter changes were blunted (<50%), consistent with elevated central venous pressure. Estimated CVP 15 mmHg. Continue  Lasix.  Continue to hold Cardizem due to bradycardia though improved.  2]chronic atrial fibrillation with bradycardia improved since holding Cardizem.  On Coumadin 3]htn-elevated noted losartan started by cardiology. Continue to hold Cardizem.   4]chronic back pain continue tramadol .  Discussed with her sister who is her POA yesterday.  The plan was to have her go to pain clinic in the past I have discussed with her primary care physician has referred her to pain management specialist.  But it is not worth taking her off the Coumadin to give a pain shot in her back.  5]urine retention-patient had a  foley which was removed 11/02/2017.she was able to urinate without any problem after foley was removed.    Discharge Instructions  Discharge Instructions    Call MD for:  difficulty breathing, headache or visual disturbances   Complete by:  As directed    Call MD for:  persistant dizziness or light-headedness   Complete by:  As directed    Call MD for:  persistant nausea and vomiting   Complete by:  As directed    Call MD for:  severe uncontrolled pain   Complete by:  As directed    Diet - low sodium heart healthy   Complete by:  As directed    Diet - low sodium heart healthy   Complete by:  As directed    Increase activity slowly   Complete by:  As directed    Increase activity slowly   Complete by:  As directed      Allergies as of 11/02/2017      Reactions   Penicillins Swelling, Rash   Has patient had a PCN reaction causing immediate rash, facial/tongue/throat swelling, SOB or lightheadedness with hypotension: Yes Has patient had a PCN reaction causing severe rash involving mucus membranes or skin necrosis: No Has patient had a PCN reaction that required hospitalization No Has patient had a PCN reaction occurring within the last 10 years: No If all of the above answers are "NO", then may proceed with Cephalosporin use.      Medication List    STOP taking these medications   aspirin 81 MG EC tablet   diltiazem 240 MG 24 hr capsule Commonly known as:  CARDIZEM CD     TAKE these medications   acetaminophen 650 MG CR tablet Commonly known as:  TYLENOL Take 650 mg by mouth every 8 (eight) hours as needed for pain.   B Complex-B12 Tabs Take 1 tablet by mouth daily.   cholecalciferol 1000 units tablet Commonly known as:  VITAMIN D Take 1,000 Units by mouth daily.   furosemide 40 MG tablet Commonly known as:  LASIX Take 1 tablet (40 mg total) by mouth 2 (two) times daily. What changed:    how much to take  when to take this   gabapentin 600 MG tablet Commonly  known as:  NEURONTIN Take 1 tablet (600 mg total) by mouth 3 (three) times daily. What changed:  when to take this   losartan 25 MG tablet Commonly known as:  COZAAR Take 1 tablet (25 mg total) by mouth daily. Start taking on:  11/03/2017   lovastatin 20 MG tablet Commonly known as:  MEVACOR Take 20 mg by mouth daily with supper.   multivitamin with minerals Tabs tablet Take 1 tablet by mouth daily.   nitroGLYCERIN 0.4 MG SL tablet Commonly known as:  NITROSTAT Place 1 tablet (0.4 mg total) under the tongue every 5 (five) minutes x 3 doses as needed for chest pain.   OVER THE COUNTER MEDICATION Place 1 drop into both eyes daily. Over the counter lubricating eye drop   potassium chloride SA 20 MEQ tablet Commonly known as:  K-DUR,KLOR-CON Take 1 tablet (20 mEq total) by mouth 2 (two) times daily.   ranitidine 150 MG tablet Commonly known as:  ZANTAC Take 150 mg by mouth 2 (two) times daily as needed for heartburn.   traMADol 50 MG tablet Commonly known as:  ULTRAM Take 50 mg by mouth every 4 (four) hours as needed (pain).   TRAVEL  SICKNESS PO Take 1 tablet by mouth at bedtime as needed (headache).   warfarin 4 MG tablet Commonly known as:  COUMADIN Take as directed. If you are unsure how to take this medication, talk to your nurse or doctor. Original instructions:  Take 1 tablet daily except 1 1/2 tablets on Wednesdays and Saturdays What changed:    how much to take  how to take this  when to take this  additional instructions      Follow-up Information    Caryl Bis, MD Follow up.   Specialty:  Family Medicine Contact information: [place] [address] [phone]        Satira Sark, MD Follow up.   Specialty:  Cardiology Contact information: [place] [address] [phone]          Allergies  Allergen Reactions  . Penicillins Swelling and Rash    Has patient had a PCN reaction causing immediate  rash, facial/tongue/throat swelling, SOB or lightheadedness with hypotension: Yes Has patient had a PCN reaction causing severe rash involving mucus membranes or skin necrosis: No Has patient had a PCN reaction that required hospitalization No Has patient had a PCN reaction occurring within the last 10 years: No If all of the above answers are "NO", then may proceed with Cephalosporin use.    Consultations: [facility] Procedures/Studies: Dg Chest 1 View  Result Date: 10/31/2017 CLINICAL DATA:  84 year old female with history of hypoxia. EXAM: CHEST  1 VIEW COMPARISON:  Chest x-ray 10/29/2017. FINDINGS: There is cephalization of the pulmonary vasculature, indistinctness of the interstitial markings, and patchy airspace disease throughout the lungs bilaterally suggestive of moderate pulmonary edema. Small to moderate bilateral pleural effusions (right greater than left). Mild cardiomegaly. Upper mediastinal contours are within normal limits. Aortic atherosclerosis. IMPRESSION: 1. Findings are most compatible with congestive heart failure, as detailed above. 2. Aortic atherosclerosis. Electronically Signed   By: Vinnie Langton M.D.   On: 10/31/2017 15:40   Dg Chest Portable 1 View  Result Date: 10/29/2017 CLINICAL DATA:  Shortness of breath with bradycardia EXAM: PORTABLE CHEST 1 VIEW COMPARISON:  08/25/2016 FINDINGS: Cardiomegaly with vascular congestion and mild diffuse interstitial opacity consistent with edema. Small left pleural effusion and probable small moderate right pleural effusion. Atelectasis or pneumonia at the right base. Aortic atherosclerosis. No pneumothorax. IMPRESSION: 1. Cardiomegaly with vascular congestion and mild interstitial edema 2. Right greater than left pleural effusions with atelectasis or pneumonia at the right base. Electronically Signed   By: Donavan Foil M.D.   On: 10/29/2017 20:01   Ct Angio Chest/abd/pel For Dissection W And/or Wo Contrast  Result Date:  10/29/2017 CLINICAL DATA:  84 year old female with shortness of breath, bradycardia, chest and back pain. EXAM: CT ANGIOGRAPHY CHEST, ABDOMEN AND PELVIS TECHNIQUE: Multidetector CT imaging through the chest, abdomen and pelvis was performed using the standard protocol during bolus administration of intravenous contrast. Multiplanar reconstructed images and MIPs were obtained and reviewed to evaluate the vascular anatomy. CONTRAST:  177mL ISOVUE-370 IOPAMIDOL (ISOVUE-370) INJECTION 76% COMPARISON:  CT chest abdomen and pelvis 08/19/2015. FINDINGS: CTA CHEST FINDINGS Cardiovascular: Cardiomegaly appears stable. Extensive calcified coronary artery atherosclerosis and/or stents. Calcified aortic atherosclerosis. Negative for thoracic aortic dissection or aneurysm. Incidental 4 vessel arch configuration (normal variant). Patent proximal great vessels. There is also adequate contrast in the central pulmonary arteries which appear patent. Mediastinum/Nodes: Mildly increased mediastinal lymph nodes which appear reactive. Lungs/Pleura: Atelectatic changes to the major airways but also suspicion of  aspirated or retained secretions in the trachea above the carina on series 8, image 44. Otherwise the major airways are patent. Moderate to large layering right pleural effusion with simple fluid density. Small layering left pleural effusion. Right greater than left lung compressive atelectasis. Respiratory motion artifact. Mild pulmonary septal thickening and perihilar ground-glass opacity. No bona fide consolidation identified. Musculoskeletal: Osteopenia. No acute osseous abnormality identified. Review of the MIP images confirms the above findings. CTA ABDOMEN AND PELVIS FINDINGS VASCULAR Aortoiliac calcified atherosclerosis. Negative for abdominal aortic dissection or aneurysm. The major arterial structures in the abdomen and pelvis remain patent and appear stable to the 2018 CTA. The portal venous system is not yet opacified on  these images. Review of the MIP images confirms the above findings. NON-VASCULAR Hepatobiliary: The gallbladder appears partially contracted and yet indistinct with surrounding pericholecystic stranding (series 7, image 173). This is not seem related to motion artifact. Scattered small hypodense areas in the liver are stable to decreased since 2018. No biliary ductal dilatation is evident. Pancreas: Negative. Spleen: Negative. Adrenals/Urinary Tract: Normal adrenal glands. Bilateral renal enhancement is symmetric and within normal limits. The kidneys appear stable. No hydronephrosis or hydroureter. The urinary bladder is distended with an estimated bladder volume of 634 milliliters, but otherwise appears normal. Stomach/Bowel: Moderate diverticulosis and redundancy of the sigmoid colon without active inflammation. Similar retained stool throughout the colon to the 2018 comparison. Redundant colonic flexures. Intermittent diverticulosis in the proximal large bowel. No acute large bowel inflammation. No dilated small bowel loops. There is flocculated material in the distal small bowel. The stomach is within normal limits. The duodenum appears normal aside from a chronic duodenum diverticulum measuring 3.5 centimeters on series 7, image 192. No abdominal free air.  No free fluid. Lymphatic: No lymphadenopathy. Reproductive: Surgically absent. Other: No pelvic free fluid. Musculoskeletal: Stable visualized osseous structures. Intermittent advanced lumbar spine degeneration. Osteopenia. Review of the MIP images confirms the above findings. IMPRESSION: 1. Positive for Aortic Atherosclerosis (ICD10-I70.0) but negative for aortic dissection or aneurysm. The central pulmonary arteries appear patent. 2. Apparent gallbladder inflammation, although the gallbladder appears partially contracted. Recommend follow-up Right Upper Quadrant Ultrasound to evaluate for possible acute cholecystitis. 3. Retained or aspirated secretions in  the trachea, but no pneumonia at this time. 4. Moderate to large layering right and small layering left pleural effusions with compressive atelectasis. Consider superimposed acute pulmonary interstitial edema. 5. Chronic cardiomegaly and coronary artery atherosclerosis. 6. Distended urinary bladder, 634 mL. Electronically Signed   By: Genevie Ann M.D.   On: 10/29/2017 20:41   US Abdomen Limited Ruq  Result Date: 10/29/2017 CLINICAL DATA:  84 year old female with appearance of gallbladder inflammation on CTA chest abdomen and pelvis earlier today performed for chest and back pain. EXAM: ULTRASOUND ABDOMEN LIMITED RIGHT UPPER QUADRANT COMPARISON:  CTA chest abdomen and pelvis 2011 hours on 10/29/2017. FINDINGS: Gallbladder: The gallbladder lumen is free of echogenic sludge or stones. There is mild gallbladder wall thickening measuring 4 millimeters (image 8) with suggestion of trace pericholecystic fluid (image 14). However, no sonographic Murphy sign was elicited. Common bile duct: Diameter: 4 millimeters, normal. Liver: Several simple, benign cysts are noted including in the left lobe measuring 16 millimeters (image 37), and the central liver measuring 15 millimeters (image 41). There is a slightly more complex but benign appearing cyst also in the inferior right lobe on image 52 measuring 14 millimeters. No intrahepatic biliary ductal dilatation. Portal vein is patent on color Doppler imaging with normal direction of  blood flow towards the liver. Other findings: There is a trace amount of free fluid adjacent to the liver on image 66. Negative visible right kidney. IMPRESSION: 1. Positive for mild gallbladder wall thickening and trace pericholecystic fluid, but negative for cholelithiasis or sonographic Murphy sign. Superimposed trace amount of nonspecific perihepatic free fluid. This constellation favors reactive gallbladder wall thickening rather than acute acalculus cholecystitis. 2. Several benign hepatic cysts  are incidentally noted. Electronically Signed   By: Genevie Ann M.D.   On: 10/29/2017 23:11    (Echo, Carotid, EGD, Colonoscopy, ERCP)    Subjective:   Discharge Exam: Vitals:   11/02/17 0856 11/02/17 1000  BP:  136/60  Pulse:    Resp:    Temp:  98 F (36.7 C)  SpO2: 97%    Vitals:   11/01/17 2044 11/02/17 0415 11/02/17 0856 11/02/17 1000  BP:  140/77  136/60  Pulse:  67    Resp:  16    Temp:  97.9 F (36.6 C)  98 F (36.7 C)  TempSrc:  Oral  Oral  SpO2: 95% 99% 97%   Weight:  78.2 kg (172 lb 4.8 oz)    Height:        General: Pt is alert, awake, not in acute distress Cardiovascular: RRR, S1/S2 +, no rubs, no gallops Respiratory: CTA bilaterally, no wheezing, no rhonchi Abdominal: Soft, NT, ND, bowel sounds + Extremities: no edema, no cyanosis    The results of significant diagnostics from this hospitalization (including imaging, microbiology, ancillary and laboratory) are listed below for reference.     Microbiology: No results found for this or any previous visit (from the past 240 hour(s)).   Labs: BNP (last 3 results) Recent Labs    10/29/17 1902  BNP 025.4*   Basic Metabolic Panel: Recent Labs  Lab 10/29/17 1911  10/29/17 2125 10/30/17 0333 10/31/17 0258 11/01/17 0258 11/02/17 0332  NA  --    < > 141 142 141 138 139  K  --    < > 3.6 3.9 4.0 4.1 4.4  CL  --    < > 107 108 105 100* 100*  CO2  --   --  24 23 28 30 31   GLUCOSE  --    < > 121* 122* 101* 109* 109*  BUN  --    < > 15 14 14 15 20   CREATININE  --    < > 0.95 0.89  0.89 1.03* 0.88 0.97  CALCIUM  --   --  8.9 9.4 9.2 9.3 10.0  MG 2.2  --   --   --  2.2  --  2.3   < > = values in this interval not displayed.   Liver Function Tests: Recent Labs  Lab 10/29/17 2125  AST 20  ALT 14  ALKPHOS 61  BILITOT 0.5  PROT 6.6  ALBUMIN 3.9   No results for input(s): LIPASE, AMYLASE in the last 168 hours. No results for input(s): AMMONIA in the last 168 hours. CBC: Recent Labs  Lab  10/29/17 1911 10/29/17 1922 10/30/17 0333  WBC 6.8  --  9.0  HGB 11.7* 11.9* 12.4  HCT 36.1 35.0* 38.3  MCV 98.1  --  99.0  PLT 222  --  198   Cardiac Enzymes: Recent Labs  Lab 10/30/17 0333 10/30/17 0955  TROPONINI 0.07* 0.06*   BNP: Invalid input(s): POCBNP CBG: No results for input(s): GLUCAP in the last 168 hours. D-Dimer No results for input(s): DDIMER  in the last 72 hours. Hgb A1c No results for input(s): HGBA1C in the last 72 hours. Lipid Profile No results for input(s): CHOL, HDL, LDLCALC, TRIG, CHOLHDL, LDLDIRECT in the last 72 hours. Thyroid function studies No results for input(s): TSH, T4TOTAL, T3FREE, THYROIDAB in the last 72 hours.  Invalid input(s): FREET3 Anemia work up No results for input(s): VITAMINB12, FOLATE, FERRITIN, TIBC, IRON, RETICCTPCT in the last 72 hours. Urinalysis No results found for: COLORURINE, APPEARANCEUR, LABSPEC, [place], GLUCOSEU, HGBUR, BILIRUBINUR, KETONESUR, PROTEINUR, UROBILINOGEN, NITRITE, LEUKOCYTESUR Sepsis Labs Invalid input(s): PROCALCITONIN,  WBC,  LACTICIDVEN Microbiology No results found for this or any previous visit (from the past 240 hour(s)).   Time coordinating discharge: 59minutes  SIGNED:   Georgette Shell, MD  [facility] 11/02/2017, 11:34 AM Pager   If 7PM-7AM, please contact night-coverage [email or web address] Password [email or web address]

## 2017-11-02 NOTE — NC FL2 (Signed)
[place] LEVEL OF CARE SCREENING TOOL     IDENTIFICATION  Patient Name: Sharon Little Birthdate: Jan 27, 1929 Sex: female Admission Date (Current Location): 10/29/2017  [facility] and [place] Number:  Herbalist and Address:  [facility], [place] [address], [place],  [address]      Provider Number: [id number]  Attending Physician Name and Address:  Georgette Shell, MD  Relative Name and Phone Number:  Micah Noel, sister, [phone]    Current Level of Care: Hospital Recommended Level of Care: [place] Prior Approval Number:    Date Approved/Denied:   PASRR Number: [id number]  Discharge Plan: SNF    Current Diagnoses: Patient Active Problem List   Diagnosis Date Noted  . Persistent atrial fibrillation ([place])   . Symptomatic bradycardia   . Hypoxia 10/30/2017  . Acute exacerbation of CHF (congestive heart failure) ([place]) 10/30/2017  . Acute on chronic heart failure ([place])   . Acute combined systolic and diastolic (congestive) hrt fail ([place]) 04/04/2016  . Acute on chronic diastolic CHF (congestive heart failure) ([place]) 04/03/2016  . Nonrheumatic aortic valve stenosis 04/03/2016  . Elevated troponin 04/03/2016  . Chronic atrial fibrillation ([place]) 04/09/2014  . Encounter for therapeutic drug monitoring 08/11/2013  . Back pain   . Dyslipidemia   . Edema   . MR (mitral regurgitation)   . IVCD (intraventricular conduction defect)   . Abnormal CT scan, chest   . Ejection fraction   . Warfarin anticoagulation   . Chest discomfort     Orientation RESPIRATION BLADDER Height & Weight     Self, Time, Situation, Place  O2 Continent, Indwelling catheter(foley) Weight: 172 lb 4.8 oz (78.2 kg) Height:  5\' 5"  (165.1 cm)  BEHAVIORAL SYMPTOMS/MOOD NEUROLOGICAL BOWEL NUTRITION STATUS      Continent Diet  AMBULATORY STATUS COMMUNICATION OF NEEDS Skin   Limited Assist Verbally Normal                        Personal Care Assistance Level of Assistance  Bathing, Feeding, Dressing Bathing Assistance: Limited assistance Feeding assistance: Independent Dressing Assistance: Limited assistance     Functional Limitations Info  Sight, Hearing, Speech Sight Info: Adequate Hearing Info: Adequate Speech Info: Adequate    SPECIAL CARE FACTORS FREQUENCY  PT (By licensed PT)     PT Frequency: 5x/week              Contractures Contractures Info: Not present    Additional Factors Info  Code Status, Allergies Code Status Info: DNR Allergies Info: Penicillins           Current Medications (11/02/2017):  This is the current hospital active medication list Current Facility-Administered Medications  Medication Dose Route Frequency Provider Last Rate Last Dose  . 0.9 %  sodium chloride infusion  250 mL Intravenous PRN Wouk, Ailene Rud, MD      . albuterol (PROVENTIL) (2.5 MG/3ML) 0.083% nebulizer solution 2.5 mg  2.5 mg Nebulization TID Georgette Shell, MD   2.5 mg at 11/02/17 0856  . furosemide (LASIX) injection 40 mg  40 mg Intravenous Q12H Georgette Shell, MD   40 mg at 11/02/17 1047  . gabapentin (NEURONTIN) tablet 600 mg  600 mg Oral TID Georgette Shell, MD   600 mg at 11/02/17 1047  . hydrALAZINE (APRESOLINE) injection 5 mg  5 mg Intravenous Q6H PRN Georgette Shell, MD      . HYDROmorphone (DILAUDID)  injection 1 mg  1 mg Intravenous Q4H PRN Gwynne Edinger, MD   1 mg at 11/01/17 1621  . losartan (COZAAR) tablet 25 mg  25 mg Oral Daily Herminio Commons, MD   25 mg at 11/02/17 1046  . ondansetron (ZOFRAN) tablet 4 mg  4 mg Oral Q8H PRN Georgette Shell, MD   4 mg at 11/01/17 1430  . pantoprazole (PROTONIX) EC tablet 40 mg  40 mg Oral Daily Georgette Shell, MD   40 mg at 11/02/17 1045  . potassium chloride SA (K-DUR,KLOR-CON) CR tablet 20 mEq  20 mEq Oral BID Georgette Shell, MD   20 mEq at 11/02/17 1046  . sodium chloride flush (NS) 0.9 %  injection 3 mL  3 mL Intravenous Q12H Wouk, Ailene Rud, MD   3 mL at 11/02/17 1047  . sodium chloride flush (NS) 0.9 % injection 3 mL  3 mL Intravenous PRN Wouk, Ailene Rud, MD      . traMADol Veatrice Bourbon) tablet 50 mg  50 mg Oral Q4H PRN Gwynne Edinger, MD   50 mg at 10/31/17 1701  . warfarin (COUMADIN) tablet 6 mg  6 mg Oral ONCE-1800 Georgette Shell, MD      . Warfarin - Pharmacist Dosing Inpatient   Does not apply q1800 Georgette Shell, MD         Discharge Medications: Please see discharge summary for a list of discharge medications.  Relevant Imaging Results:  Relevant Lab Results:   Additional Information SSN: [id number]  Estanislado Emms, LCSW

## 2017-11-03 DIAGNOSIS — I10 Essential (primary) hypertension: Secondary | ICD-10-CM | POA: Diagnosis not present

## 2017-11-03 DIAGNOSIS — M2550 Pain in unspecified joint: Secondary | ICD-10-CM | POA: Diagnosis not present

## 2017-11-03 DIAGNOSIS — M6281 Muscle weakness (generalized): Secondary | ICD-10-CM | POA: Diagnosis not present

## 2017-11-03 DIAGNOSIS — I5033 Acute on chronic diastolic (congestive) heart failure: Secondary | ICD-10-CM | POA: Diagnosis not present

## 2017-11-03 DIAGNOSIS — I482 Chronic atrial fibrillation: Secondary | ICD-10-CM | POA: Diagnosis not present

## 2017-11-03 DIAGNOSIS — Z9181 History of falling: Secondary | ICD-10-CM | POA: Diagnosis not present

## 2017-11-03 DIAGNOSIS — M549 Dorsalgia, unspecified: Secondary | ICD-10-CM | POA: Diagnosis not present

## 2017-11-03 DIAGNOSIS — E119 Type 2 diabetes mellitus without complications: Secondary | ICD-10-CM | POA: Diagnosis not present

## 2017-11-03 DIAGNOSIS — Z7901 Long term (current) use of anticoagulants: Secondary | ICD-10-CM | POA: Diagnosis not present

## 2017-11-03 DIAGNOSIS — R609 Edema, unspecified: Secondary | ICD-10-CM | POA: Diagnosis not present

## 2017-11-03 DIAGNOSIS — R0902 Hypoxemia: Secondary | ICD-10-CM | POA: Diagnosis not present

## 2017-11-03 DIAGNOSIS — J9601 Acute respiratory failure with hypoxia: Secondary | ICD-10-CM

## 2017-11-03 DIAGNOSIS — I350 Nonrheumatic aortic (valve) stenosis: Secondary | ICD-10-CM | POA: Diagnosis not present

## 2017-11-03 DIAGNOSIS — G8929 Other chronic pain: Secondary | ICD-10-CM | POA: Diagnosis not present

## 2017-11-03 DIAGNOSIS — E876 Hypokalemia: Secondary | ICD-10-CM | POA: Diagnosis not present

## 2017-11-03 DIAGNOSIS — R001 Bradycardia, unspecified: Secondary | ICD-10-CM | POA: Diagnosis not present

## 2017-11-03 DIAGNOSIS — R2689 Other abnormalities of gait and mobility: Secondary | ICD-10-CM | POA: Diagnosis not present

## 2017-11-03 DIAGNOSIS — Z7401 Bed confinement status: Secondary | ICD-10-CM | POA: Diagnosis not present

## 2017-11-03 DIAGNOSIS — I509 Heart failure, unspecified: Secondary | ICD-10-CM | POA: Diagnosis not present

## 2017-11-03 DIAGNOSIS — K219 Gastro-esophageal reflux disease without esophagitis: Secondary | ICD-10-CM | POA: Diagnosis not present

## 2017-11-03 DIAGNOSIS — I4891 Unspecified atrial fibrillation: Secondary | ICD-10-CM | POA: Diagnosis not present

## 2017-11-03 DIAGNOSIS — I454 Nonspecific intraventricular block: Secondary | ICD-10-CM | POA: Diagnosis not present

## 2017-11-03 LAB — PROTIME-INR
INR: 1.29
Prothrombin Time: 16 seconds — ABNORMAL HIGH (ref 11.4–15.2)

## 2017-11-03 MED ORDER — RANITIDINE HCL 150 MG PO TABS: 150.0000 mg | ORAL_TABLET | Freq: Two times a day (BID) | ORAL | 1 refills | Status: AC | PRN

## 2017-11-03 MED ORDER — LOVASTATIN 20 MG PO TABS: 20.0000 mg | ORAL_TABLET | Freq: Every day | ORAL | 0 refills | Status: AC

## 2017-11-03 MED ORDER — NITROGLYCERIN 0.4 MG SL SUBL: 0.4000 mg | SUBLINGUAL_TABLET | SUBLINGUAL | 3 refills | Status: AC | PRN

## 2017-11-03 MED ORDER — ALBUTEROL SULFATE (2.5 MG/3ML) 0.083% IN NEBU: 2.5000 mg | INHALATION_SOLUTION | RESPIRATORY_TRACT | 12 refills | Status: DC | PRN

## 2017-11-03 MED ORDER — TRAMADOL HCL 50 MG PO TABS: 50.0000 mg | ORAL_TABLET | ORAL | 0 refills | Status: AC | PRN

## 2017-11-03 MED ORDER — WARFARIN SODIUM 4 MG PO TABS: ORAL_TABLET | ORAL | 6 refills | Status: AC

## 2017-11-03 MED ORDER — FUROSEMIDE 40 MG PO TABS: 40.0000 mg | ORAL_TABLET | Freq: Two times a day (BID) | ORAL | 11 refills | Status: AC

## 2017-11-03 NOTE — Care Management Important Message (Signed)
Important Message  Patient Details  Name: Sharon Little MRN: [id number] Date of Birth: 1928-11-13   Medicare Important Message Given:  Yes    Pho[place]ie Shad P Cristine Daw 11/03/2017, 2:16 PM

## 2017-11-03 NOTE — Progress Notes (Signed)
Order received to discharge patient.  PIV access removed.  Telemetry monitor removed and CCMD notified.  PTAR called.  Receiving RN at SNF given report.

## 2017-11-03 NOTE — Progress Notes (Signed)
PROGRESS NOTE    Sharon Little  KGU:[id number] DOB: [birth year] DOA: 10/29/2017 PCP: Caryl Bis, MD   Brief Narrative: :84 y.o.femalewith medical history significant fordiastolic chf, intraventricular conduction delay, chronic atrial fibrillation, chronic back pain on daily opioid, moderate aortic stenosis, presenting with the above.  Says symptoms have been building for a week or two. Has also noticed increased LE swelling for several days. Today her shortness of breath was significant and she called EMS. Denies chest pain, denies cough, denies vomiting, denies abdominal pain. Says has doubled up on lasix last few days but swelling and sob continued to worsen. Is unsure of dry weight - doesn't weigh self often - but thinks probably in the 170s. Compliant with medications. Is not on home O2.  Also complains of back pain. Chronic problem present for years, comes and goes, currently present. Says has been referred to pain mgmt but hasn't been scheduled yet. Denies new bowel/bladder dysfunction, denies saddle anesthesia, denies new gait abnormality or LE weakness/numbness.    Assessment & Plan:   Active Problems:   Edema   IVCD (intraventricular conduction defect)   Warfarin anticoagulation   Back pain   Chronic atrial fibrillation (HCC)   Acute on chronic diastolic CHF (congestive heart failure) (HCC)   Nonrheumatic aortic valve stenosis   Hypoxia   Acute exacerbation of CHF (congestive heart failure) (HCC)   Acute on chronic heart failure (HCC)   Symptomatic bradycardia   Persistent atrial fibrillation (HCC)  1]Acute on chronic CHF -ECHO showsLeft ventricle: The cavity size was normal. Wall thickness was increased in a pattern of mild LVH. Systolic function was normal. The estimated ejection fraction was in the range of 55% to 60%. Wall motion was normal; there were no regional wall motion abnormalities. The study is not technically sufficient to allow evaluation  of LV diastolic function. - Aortic valve: Severely calcified annulus. Moderately calcified leaflets. There was moderate to severe stenosis. There was mild regurgitation. Peak velocity (S): 330 cm/s. Mean gradient (S): 24 mm Hg. Valve area (VTI): 0.99 cm^2. Valve area (Vmax): 1 cm^2. Valve area (Vmean): 1.02 cm^2. - Mitral valve: Moderately calcified annulus. There was moderate regurgitation. - Left atrium: The atrium was severely dilated. - Right ventricle: Systolic function was mildly to moderately reduced. - Right atrium: The atrium was severely dilated. - Atrial septum: No defect or patent foramen ovale was identified. - Tricuspid valve: There was moderate regurgitation. - Inferior vena cava: The vessel was dilated. The respirophasic diameter changes were blunted (<50%), consistent with elevated central venous pressure. Estimated CVP 15 mmHg Continue IV Lasix. Follow-up chest x-ray today. Echo to evaluate aortic valve. Continue to hold Cardizem due to bradycardia though improved.  2]chronic atrial fibrillation with bradycardia improved since holding Cardizem.  On Coumadin followed by pharmacy   3]htn-elevated noted losartan started by cardiology. Continue to hold Cardizem. Will add as needed hydralazine.  4]chronic back pain continue tramadol as needed Dilaudid.   5]urine retention-dc foley today.    DVT prophylaxis: coumadin Code Status:dnr Family Communication: dw sister Disposition Plan:hope to dc in am Consultants: chmg  Procedures: none Antimicrobials: none  Subjective: Feels well..  Objective: Vitals:   11/02/17 1000 11/02/17 1356 11/02/17 1955 11/03/17 0334  BP: 136/60 (!) 111/52 (!) 109/43 (!) 142/50  Pulse:  (!) 58 (!) 57 (!) 55  Resp:  18 14 16   Temp: 98 F (36.7 C) 98.1 F (36.7 C) 98.4 F (36.9 C) 97.9 F (36.6 C)  TempSrc: Oral Oral Oral  Oral  SpO2:  95% 95% 96%  Weight:    78.5 kg (173 lb)  Height:         Intake/Output Summary (Last 24 hours) at 11/03/2017 0820 Last data filed at 11/03/2017 0339 Gross per 24 hour  Intake 582 ml  Output 2000 ml  Net -1418 ml   Filed Weights   11/01/17 0533 11/02/17 0415 11/03/17 0334  Weight: 80.1 kg (176 lb 8 oz) 78.2 kg (172 lb 4.8 oz) 78.5 kg (173 lb)    Examination:  General exam: Appears calm and comfortable  Respiratory system: decreased breath sounds  to auscultation. Respiratory effort normal. Cardiovascular system: S1 & S2 heard, RRR. No JVD, murmurs, rubs, gallops or clicks. No pedal edema. Gastrointestinal system: Abdomen is nondistended, soft and nontender. No organomegaly or masses felt. Normal bowel sounds heard. Central nervous system: Alert and oriented. No focal neurological deficits. Extremities: Symmetric 5 x 5 power. Skin: No rashes, lesions or ulcers Psychiatry: Judgement and insight appear normal. Mood & affect appropriate.     Data Reviewed: I have personally reviewed following labs and imaging studies  CBC: Recent Labs  Lab 10/29/17 1911 10/29/17 1922 10/30/17 0333  WBC 6.8  --  9.0  HGB 11.7* 11.9* 12.4  HCT 36.1 35.0* 38.3  MCV 98.1  --  99.0  PLT 222  --  295   Basic Metabolic Panel: Recent Labs  Lab 10/29/17 1911  10/29/17 2125 10/30/17 0333 10/31/17 0258 11/01/17 0258 11/02/17 0332  NA  --    < > 141 142 141 138 139  K  --    < > 3.6 3.9 4.0 4.1 4.4  CL  --    < > 107 108 105 100* 100*  CO2  --   --  24 23 28 30 31   GLUCOSE  --    < > 121* 122* 101* 109* 109*  BUN  --    < > 15 14 14 15 20   CREATININE  --    < > 0.95 0.89  0.89 1.03* 0.88 0.97  CALCIUM  --   --  8.9 9.4 9.2 9.3 10.0  MG 2.2  --   --   --  2.2  --  2.3   < > = values in this interval not displayed.   GFR: Estimated Creatinine Clearance: 42.3 mL/min (by C-G formula based on SCr of 0.97 mg/dL). Liver Function Tests: Recent Labs  Lab 10/29/17 2125  AST 20  ALT 14  ALKPHOS 61  BILITOT 0.5  PROT 6.6  ALBUMIN 3.9   No  results for input(s): LIPASE, AMYLASE in the last 168 hours. No results for input(s): AMMONIA in the last 168 hours. Coagulation Profile: Recent Labs  Lab 10/30/17 0333 10/31/17 0258 11/01/17 0258 11/02/17 0332 11/03/17 0359  INR 3.77 3.42 1.93 1.36 1.29   Cardiac Enzymes: Recent Labs  Lab 10/30/17 0333 10/30/17 0955  TROPONINI 0.07* 0.06*   BNP (last 3 results) No results for input(s): PROBNP in the last 8760 hours. HbA1C: No results for input(s): HGBA1C in the last 72 hours. CBG: No results for input(s): GLUCAP in the last 168 hours. Lipid Profile: No results for input(s): CHOL, HDL, LDLCALC, TRIG, CHOLHDL, LDLDIRECT in the last 72 hours. Thyroid Function Tests: No results for input(s): TSH, T4TOTAL, FREET4, T3FREE, THYROIDAB in the last 72 hours. Anemia Panel: No results for input(s): VITAMINB12, FOLATE, FERRITIN, TIBC, IRON, RETICCTPCT in the last 72 hours. Sepsis Labs: No results for input(s): PROCALCITON, LATICACIDVEN in  the last 168 hours.  No results found for this or any previous visit (from the past 240 hour(s)).       Radiology Studies: No results found.      Scheduled Meds: . furosemide  40 mg Intravenous Q12H  . gabapentin  600 mg Oral TID  . losartan  25 mg Oral Daily  . pantoprazole  40 mg Oral Daily  . potassium chloride  20 mEq Oral BID  . sodium chloride flush  3 mL Intravenous Q12H  . Warfarin - Pharmacist Dosing Inpatient   Does not apply q1800   Continuous Infusions: . sodium chloride       LOS: 4 days     Georgette Shell, MD [facility]  If 7PM-7AM, please contact night-coverage [email or web address] Password [email or web address] 11/03/2017, 8:20 AM

## 2017-11-03 NOTE — Progress Notes (Signed)
Discharge orders placed again for patient. CSW arranged discharge to facility yesterday. Patient will discharge to [facility] today. Please see yesterday's note for additional discharge information, including RN number for report.  CSW signing off.  Estanislado Emms, [place]  Clinical Social Worker

## 2017-11-04 DIAGNOSIS — I4891 Unspecified atrial fibrillation: Secondary | ICD-10-CM | POA: Diagnosis not present

## 2017-11-04 DIAGNOSIS — I5033 Acute on chronic diastolic (congestive) heart failure: Secondary | ICD-10-CM | POA: Diagnosis not present

## 2017-11-17 ENCOUNTER — Ambulatory Visit: Payer: Medicare Other | Admitting: Physician Assistant

## 2017-11-27 DEATH — deceased

## 2017-12-09 ENCOUNTER — Ambulatory Visit: Payer: Medicare Other | Admitting: Cardiology

## 2019-12-29 IMAGING — CT CT ANGIO CHEST-ABD-PELV FOR DISSECTION W/ AND WO/W CM
3 of 11 series · 8 of 36 positions shown, 14 images · IV contrast (iopamidol)
Comparison: CT chest abdomen and pelvis 08/19/2015.

CLINICAL DATA: 89-year-old female with shortness of breath,
bradycardia, chest and back pain.

EXAM:
CT ANGIOGRAPHY CHEST, ABDOMEN AND PELVIS
TECHNIQUE: Multidetector CT imaging through the chest, abdomen and pelvis was
performed using the standard protocol during bolus administration of
intravenous contrast. Multiplanar reconstructed images and MIPs were
obtained and reviewed to evaluate the vascular anatomy.
CONTRAST:  100mL [id number] IOPAMIDOL (N27AUI-D0E) INJECTION 76%

[Series 7: arterial f_0.5 · axial · arterial · 0.84mm/px · z∈[+770,+1198]mm · 5 of 322 slices shown, 10 images]
[im 54/322  mediastinal]
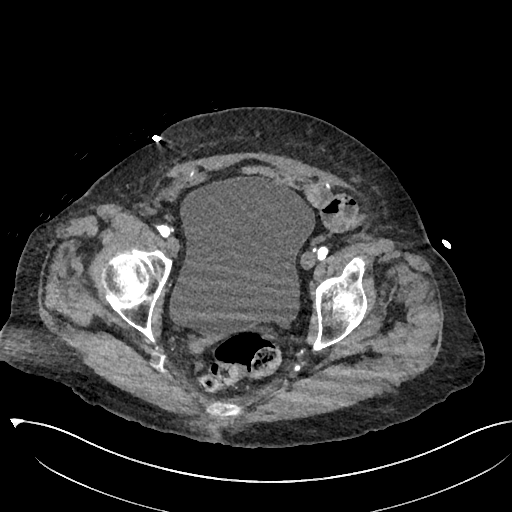
[im 54/322  bone]
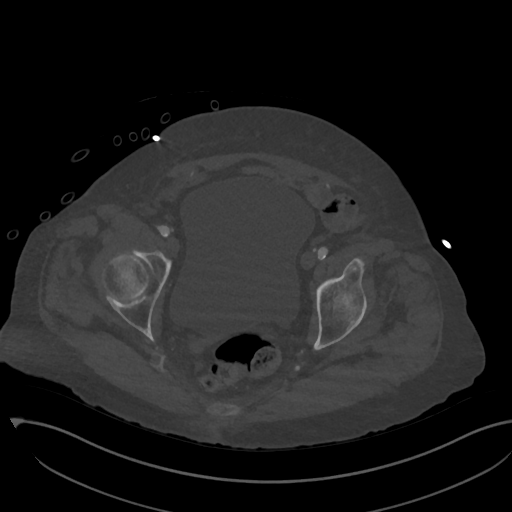
[im 108/322  mediastinal]
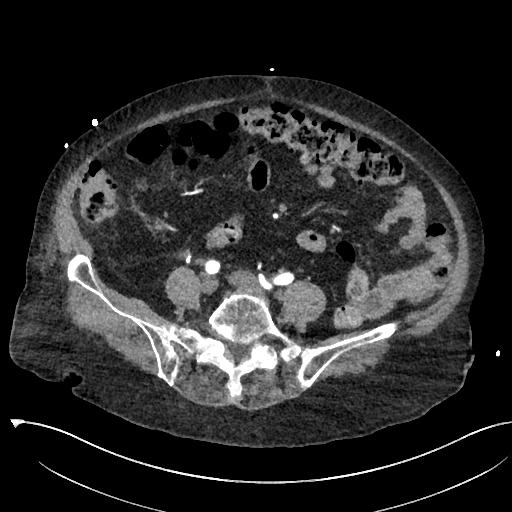
[im 108/322  lung]
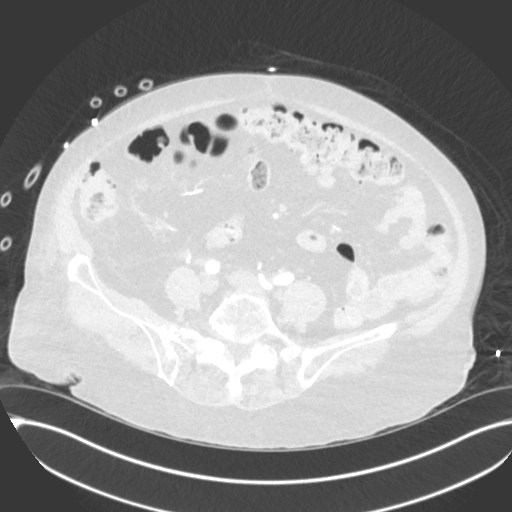
[im 161/322  mediastinal]
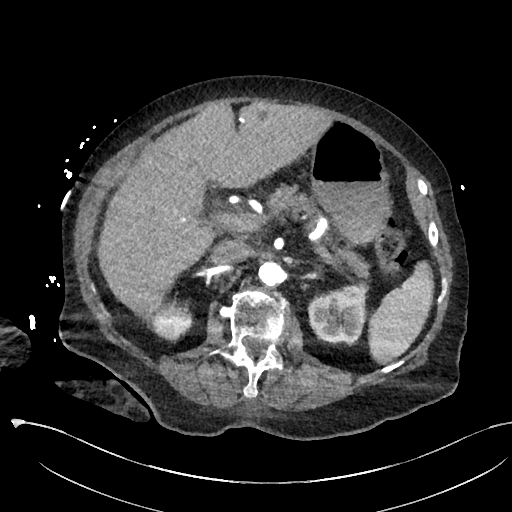
[im 161/322  lung]
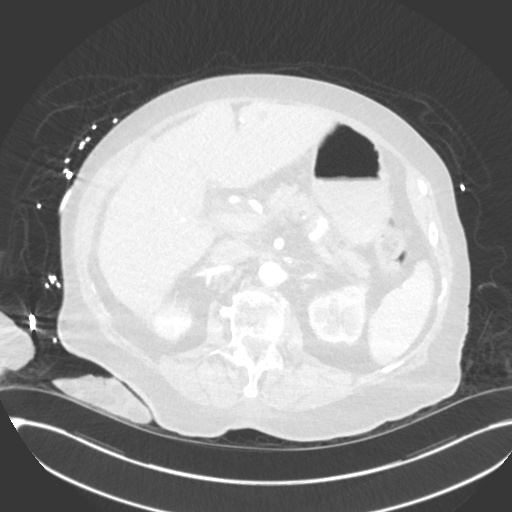
[im 215/322  mediastinal]
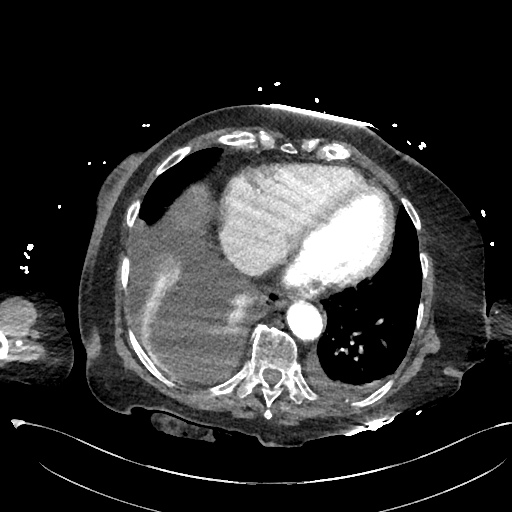
[im 215/322  lung]
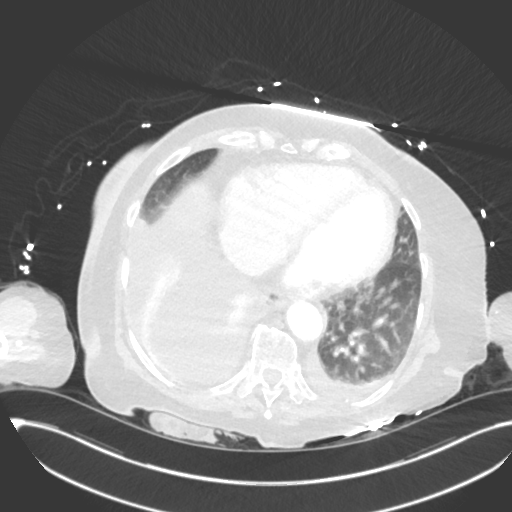
[im 268/322  mediastinal]
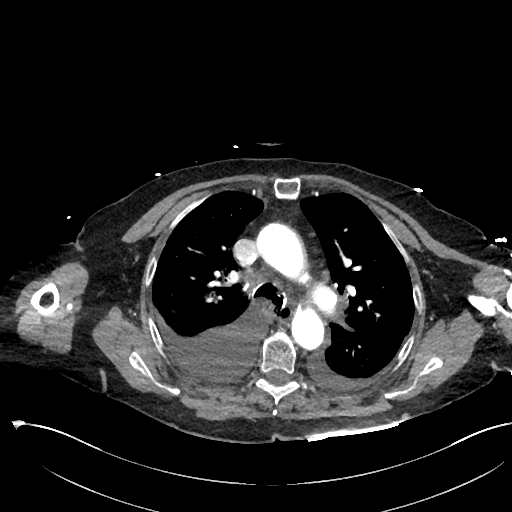
[im 268/322  lung]
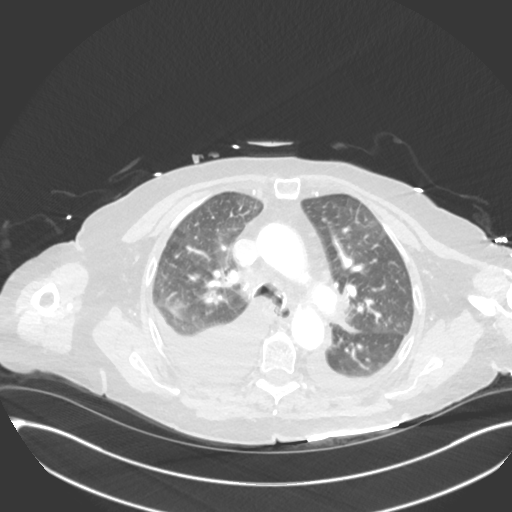

[Series 8: lung f_0.5 · axial · 0.84mm/px · z∈[+1058,+1182]mm · 2 of 187 slices shown]
[im 63/187  bone]
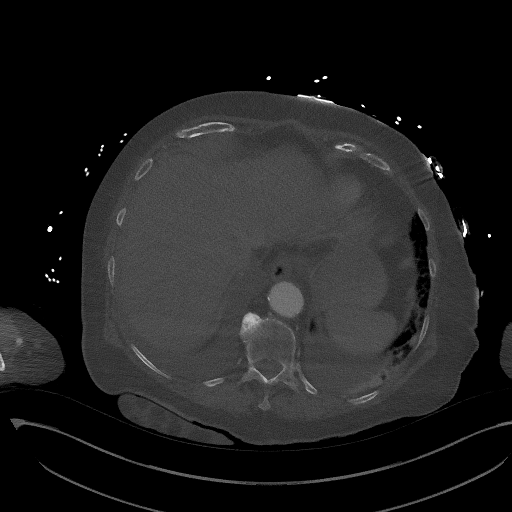
[im 125/187  bone]
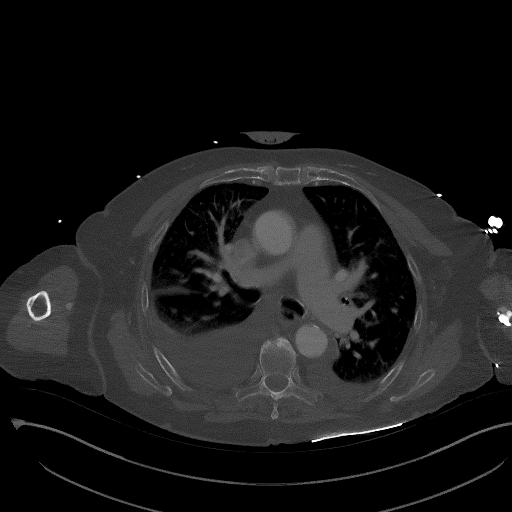

[Series 10: sag f_0.5 · sagittal · 0.63mm/px · 1 of 177 slices shown, 2 images]
[im 28/177  mediastinal]
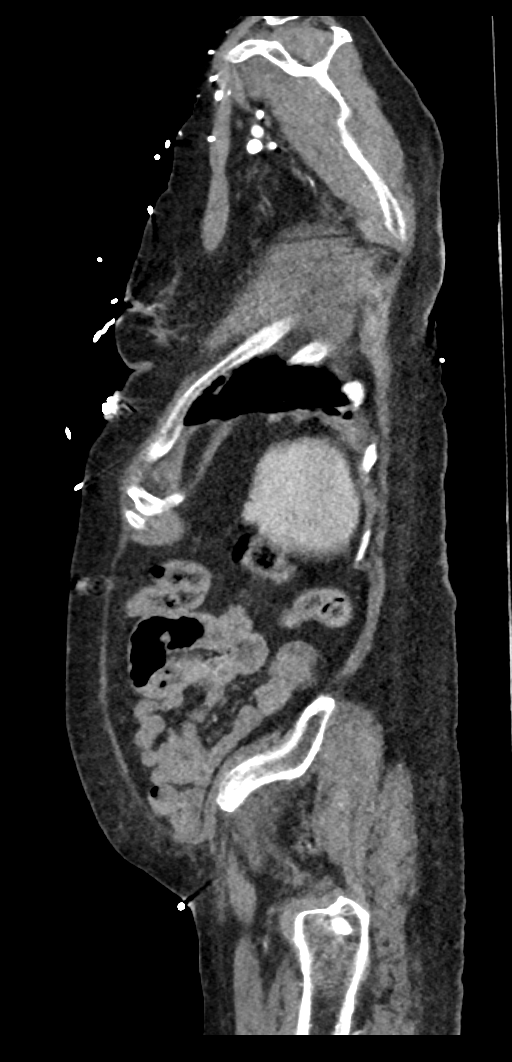
[im 28/177  bone]
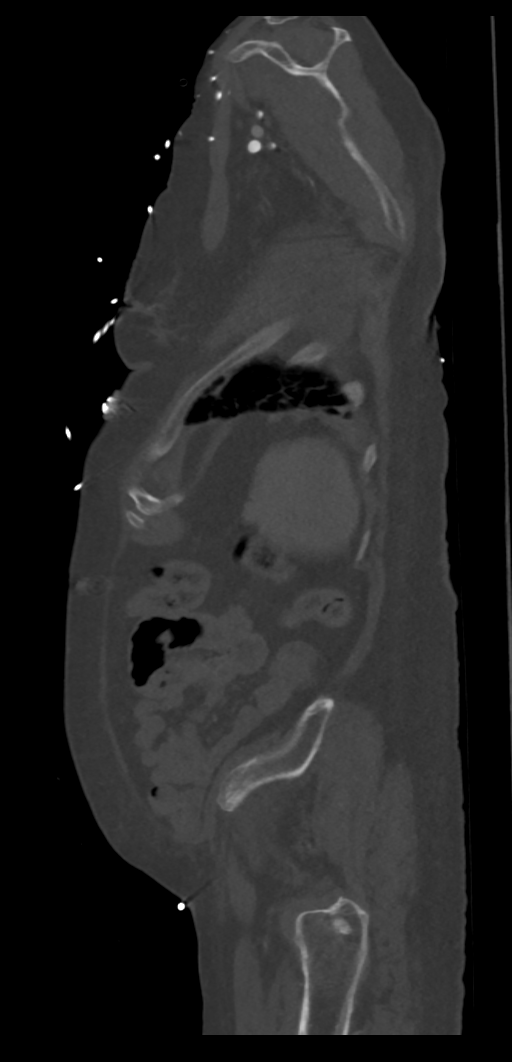

[8 of 36 positions shown; findings below may reference images not displayed]

FINDINGS: CTA CHEST FINDINGS

Cardiovascular: Cardiomegaly appears stable. Extensive calcified
coronary artery atherosclerosis and/or stents. Calcified aortic
atherosclerosis. Negative for thoracic aortic dissection or
aneurysm. Incidental 4 vessel arch configuration (normal variant).
Patent proximal great vessels.

There is also adequate contrast in the central pulmonary arteries
which appear patent.

Mediastinum/Nodes: Mildly increased mediastinal lymph nodes which
appear reactive.

Lungs/Pleura: Atelectatic changes to the major airways but also
suspicion of aspirated or retained secretions in the trachea above
the carina on series 8, image 44. Otherwise the major airways are
patent.

Moderate to large layering right pleural effusion with simple fluid
density. Small layering left pleural effusion. Right greater than
left lung compressive atelectasis. Respiratory motion artifact. Mild
pulmonary septal thickening and perihilar ground-glass opacity. No
Miljojko Nathalie consolidation identified.

Musculoskeletal: Osteopenia. No acute osseous abnormality
identified.

Review of the MIP images confirms the above findings.

CTA ABDOMEN AND PELVIS FINDINGS

VASCULAR

Aortoiliac calcified atherosclerosis. Negative for abdominal aortic
dissection or aneurysm. The major arterial structures in the abdomen
and pelvis remain patent and appear stable to the 3764 CTA. The
portal venous system is not yet opacified on these images.

Review of the MIP images confirms the above findings.

NON-VASCULAR

Hepatobiliary: The gallbladder appears partially contracted and yet
indistinct with surrounding pericholecystic stranding (series 7,
image 173). This is not seem related to motion artifact. Scattered
small hypodense areas in the liver are stable to decreased since
3764. No biliary ductal dilatation is evident.

Pancreas: Negative.

Spleen: Negative.

Adrenals/Urinary Tract: Normal adrenal glands. Bilateral renal
enhancement is symmetric and within normal limits. The kidneys
appear stable. No hydronephrosis or hydroureter.

The urinary bladder is distended with an estimated bladder volume of
634 milliliters, but otherwise appears normal.

Stomach/Bowel: Moderate diverticulosis and redundancy of the sigmoid
colon without active inflammation. Similar retained stool throughout
the colon to the 3764 comparison. Redundant colonic flexures.
Intermittent diverticulosis in the proximal large bowel. No acute
large bowel inflammation. No dilated small bowel loops. There is
flocculated material in the distal small bowel. The stomach is
within normal limits. The duodenum appears normal aside from a
chronic duodenum diverticulum measuring 3.5 centimeters on series 7,
image 192.

No abdominal free air.  No free fluid.

Lymphatic: No lymphadenopathy.

Reproductive: Surgically absent.

Other: No pelvic free fluid.

Musculoskeletal: Stable visualized osseous structures. Intermittent
advanced lumbar spine degeneration. Osteopenia.

Review of the MIP images confirms the above findings.
IMPRESSION: 1. Positive for Aortic Atherosclerosis ([id number]) but negative
for aortic dissection or aneurysm. The central pulmonary arteries
appear patent.
2. Apparent gallbladder inflammation, although the gallbladder
appears partially contracted. Recommend follow-up Right Upper
[place] Ultrasound to evaluate for possible acute cholecystitis.
3. Retained or aspirated secretions in the trachea, but no pneumonia
at this time.
4. Moderate to large layering right and small layering left pleural
effusions with compressive atelectasis. Consider superimposed acute
pulmonary interstitial edema.
5. Chronic cardiomegaly and coronary artery atherosclerosis.
6. Distended urinary bladder, 634 mL.

## 2019-12-31 IMAGING — DX DG CHEST 1V
1 series · 1 of 1 positions shown · non-contrast
Comparison: Chest x-ray 10/29/2017.

CLINICAL DATA: 89-year-old female with history of hypoxia.

EXAM:
CHEST  1 VIEW

[chest ap]
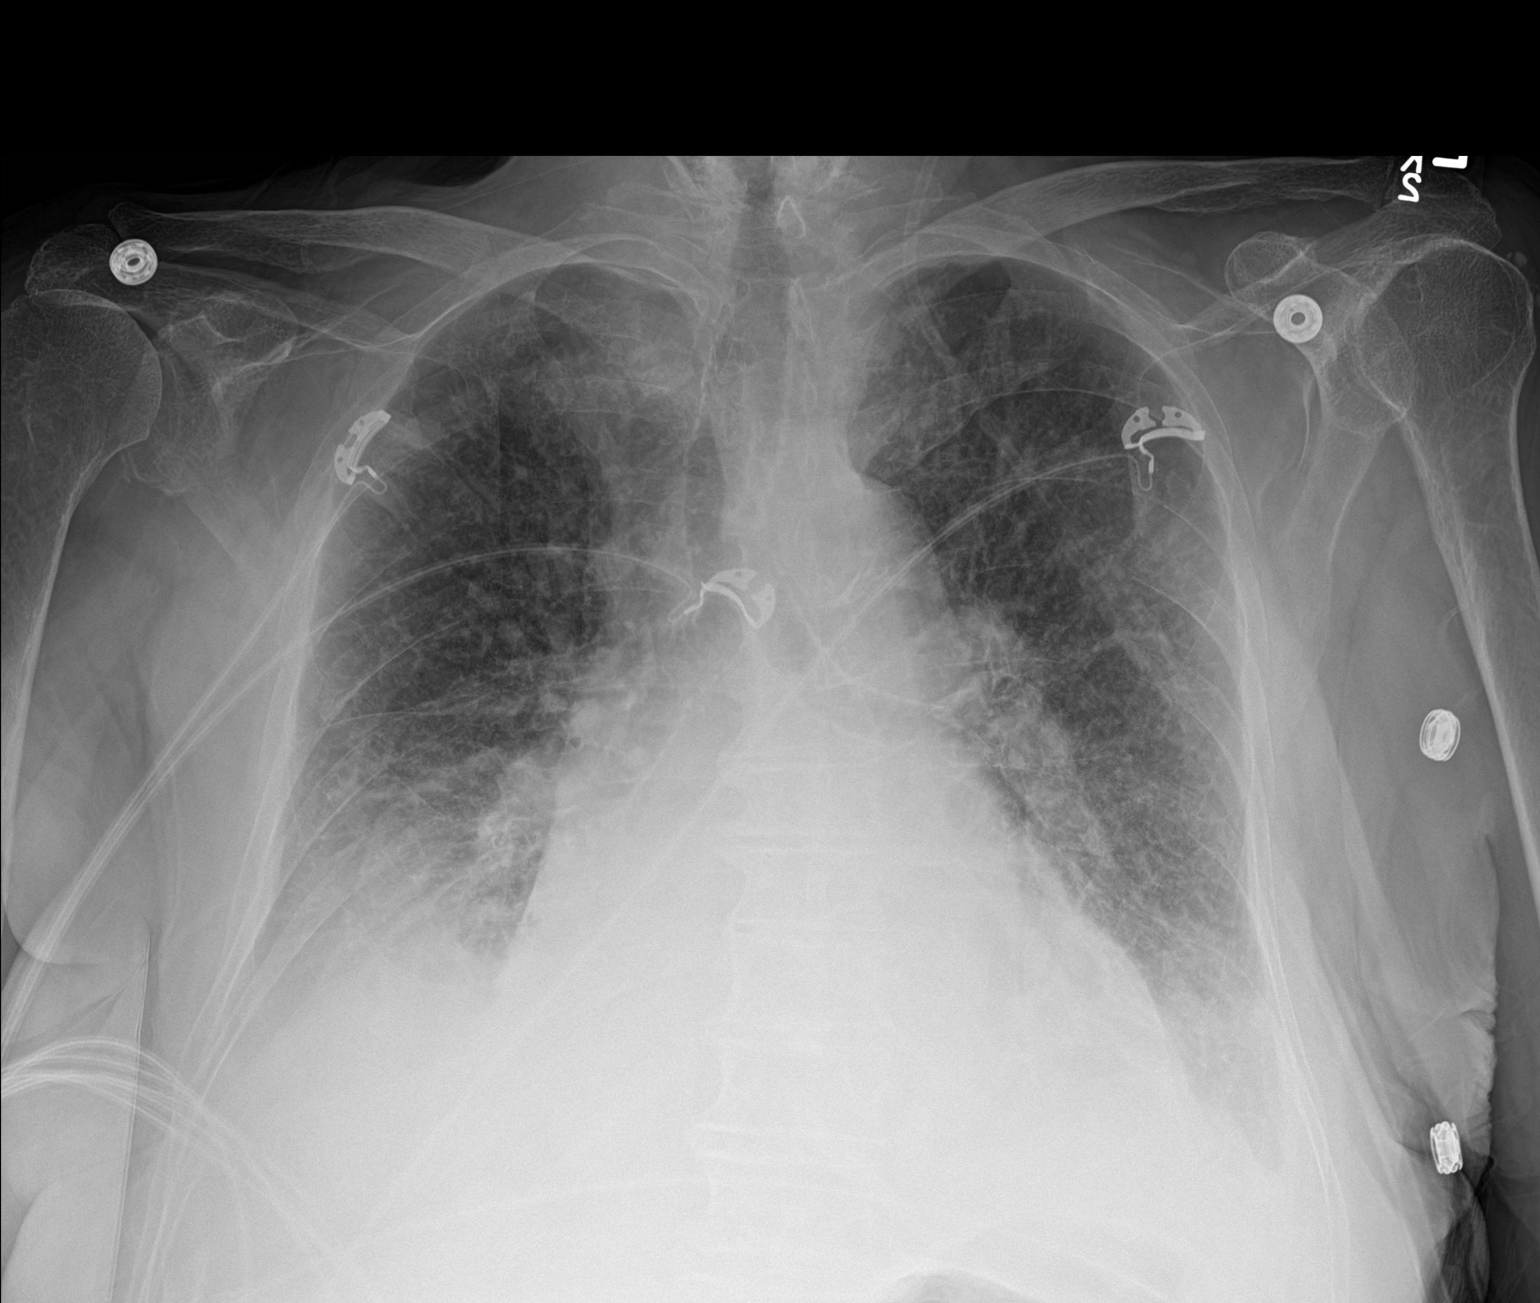

[1 of 1 positions shown; findings below may reference images not displayed]

FINDINGS: There is cephalization of the pulmonary vasculature, indistinctness
of the interstitial markings, and patchy airspace disease throughout
the lungs bilaterally suggestive of moderate pulmonary edema. Small
to moderate bilateral pleural effusions (right greater than left).
Mild cardiomegaly. Upper mediastinal contours are within normal
limits. Aortic atherosclerosis.
IMPRESSION: 1. Findings are most compatible with congestive heart failure, as
detailed above.
2. Aortic atherosclerosis.
